# Patient Record
Sex: Female | Born: 1990 | Race: White | Hispanic: No | Smoking: Current every day smoker
Health system: Southern US, Community
[De-identification: ages and names within clinical notes are randomized; demographics above are authoritative.]

## PROBLEM LIST (undated history)

## (undated) DIAGNOSIS — F32A Depression, unspecified: Secondary | ICD-10-CM

## (undated) DIAGNOSIS — F419 Anxiety disorder, unspecified: Secondary | ICD-10-CM

## (undated) DIAGNOSIS — Q2545 Double aortic arch: Secondary | ICD-10-CM

## (undated) DIAGNOSIS — O009 Unspecified ectopic pregnancy without intrauterine pregnancy: Secondary | ICD-10-CM

## (undated) DIAGNOSIS — E039 Hypothyroidism, unspecified: Secondary | ICD-10-CM

## (undated) DIAGNOSIS — F319 Bipolar disorder, unspecified: Secondary | ICD-10-CM

## (undated) DIAGNOSIS — F329 Major depressive disorder, single episode, unspecified: Secondary | ICD-10-CM

## (undated) DIAGNOSIS — X789XXA Intentional self-harm by unspecified sharp object, initial encounter: Secondary | ICD-10-CM

## (undated) DIAGNOSIS — F17200 Nicotine dependence, unspecified, uncomplicated: Secondary | ICD-10-CM

## (undated) DIAGNOSIS — Z8619 Personal history of other infectious and parasitic diseases: Secondary | ICD-10-CM

## (undated) DIAGNOSIS — N83209 Unspecified ovarian cyst, unspecified side: Secondary | ICD-10-CM

## (undated) DIAGNOSIS — F410 Panic disorder [episodic paroxysmal anxiety] without agoraphobia: Secondary | ICD-10-CM

## (undated) HISTORY — DX: Double aortic arch: Q25.45

## (undated) HISTORY — DX: Unspecified ectopic pregnancy without intrauterine pregnancy: O00.90

## (undated) HISTORY — DX: Personal history of other infectious and parasitic diseases: Z86.19

## (undated) HISTORY — DX: Unspecified ovarian cyst, unspecified side: N83.209

---
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
## 2008-01-09 ENCOUNTER — Ambulatory Visit (HOSPITAL_COMMUNITY): Admission: RE | Admit: 2008-01-09 | Discharge: 2008-01-09 | Payer: Self-pay | Admitting: Obstetrics & Gynecology

## 2009-04-14 ENCOUNTER — Emergency Department (HOSPITAL_COMMUNITY): Admission: EM | Admit: 2009-04-14 | Discharge: 2009-04-14 | Payer: Self-pay | Admitting: Family Medicine

## 2010-02-02 ENCOUNTER — Emergency Department (HOSPITAL_COMMUNITY): Admission: EM | Admit: 2010-02-02 | Discharge: 2010-02-02 | Payer: Self-pay | Admitting: Family Medicine

## 2010-08-28 ENCOUNTER — Encounter: Payer: Self-pay | Admitting: Obstetrics & Gynecology

## 2010-08-28 ENCOUNTER — Encounter: Payer: Self-pay | Admitting: Family Medicine

## 2010-10-23 LAB — URINE CULTURE: Culture: NO GROWTH

## 2010-10-23 LAB — URINALYSIS, MICROSCOPIC ONLY
Bilirubin Urine: NEGATIVE
Glucose, UA: NEGATIVE mg/dL
Specific Gravity, Urine: 1.024 (ref 1.005–1.030)
Urobilinogen, UA: 1 mg/dL (ref 0.0–1.0)

## 2010-10-23 LAB — POCT URINALYSIS DIP (DEVICE)
Bilirubin Urine: NEGATIVE
Glucose, UA: NEGATIVE mg/dL
Nitrite: NEGATIVE
Urobilinogen, UA: 0.2 mg/dL (ref 0.0–1.0)

## 2010-10-23 LAB — WET PREP, GENITAL: Yeast Wet Prep HPF POC: NONE SEEN

## 2010-10-23 LAB — GC/CHLAMYDIA PROBE AMP, GENITAL
Chlamydia, DNA Probe: NEGATIVE
GC Probe Amp, Genital: NEGATIVE

## 2010-12-20 NOTE — H&P (Signed)
Alexa Guerra, Alexa Guerra NO.:  [id number]   MEDICAL RECORD NO.:  [id number]          PATIENT TYPE:  INP   LOCATION:  9306                          FACILITY:  [facility]   PHYSICIAN:  Roseanna Rainbow, M.D.DATE OF BIRTH:  May 30, 1991   DATE OF ADMISSION:  12/18/2007  DATE OF DISCHARGE:                              HISTORY & PHYSICAL   CHIEF COMPLAINT:  The patient is a 20 year old who presented to the  [facility] ED complaining of nausea, vomiting, and abdominal pain.   HISTORY OF PRESENT ILLNESS:  The patient reports the pain starting after  intercourse approximately 12 hours prior to presentation.  The pain  initially started in her abdomen, and she also subsequently developed  shoulder pain.  There was associated nausea and vomiting.   PAST MEDICAL HISTORY:  Possible depression.   SOCIAL HISTORY:  No tobacco, ethanol, or drug use.   FAMILY HISTORY:  Noncontributory.   PAST GYN HISTORY:  Last menstrual period November 25, 2007.   MEDICATIONS:  Lamictal and Wellbutrin.   ALLERGIES:  No known drug allergies.   PHYSICAL EXAMINATION:  VITAL SIGNS:  Blood pressure 113/71, temperature  97.2, pulse 85, and respirations 16.  GENERAL: In mild distress.  ABDOMEN: Soft.  There is mild diffuse lower quadrant and bilateral  tenderness.  No rebound.  No guarding.  PELVIC: Deferred.   LABORATORY DATA:  At 10:43, white blood cell count 11.6 and hemoglobin  13.4.  Complete metabolic profile, normal.  Urinalysis, specific gravity  1.026 and 15 ketones.  Wet Prep negative.  Hemoglobin at 18:00 11.6.  PT/PTT normal.  A CT scan of the abdomen and pelvis was remarkable for a  complex cystic area within the right ovary approximately 3 cm in  diameter consistent with likely collapsing hemorrhagic cyst, and there  was a large amount of free fluid in the pelvis.  Abdominal x-ray, no  acute abdominal findings.  Urine pregnancy test, negative.   ASSESSMENT:  Likely ruptured hemorrhagic  cyst with hemoperitoneum.  Hemodynamically stable at present.   PLAN:  1. Observation with serial labs and abdominal exams.  2. N.p.o. for now.  3. IV hydration.  4. Analgesics to include a morphine PCA.      Roseanna Rainbow, M.D.  Electronically Signed     LAJ/MEDQ  D:  12/18/2007  T:  12/19/2007  Job:  [id number]

## 2010-12-21 DIAGNOSIS — Z8659 Personal history of other mental and behavioral disorders: Secondary | ICD-10-CM

## 2010-12-21 HISTORY — DX: Personal history of other mental and behavioral disorders: Z86.59

## 2010-12-23 NOTE — Discharge Summary (Signed)
NAMEJINX, GILDEN NO.:  [id number]   MEDICAL RECORD NO.:  [id number]          PATIENT TYPE:  INP   LOCATION:  0105                          FACILITY:  [facility]   PHYSICIAN:  Carolanne Grumbling, M.D.    DATE OF BIRTH:  [birth year]   DATE OF ADMISSION:  04/29/2004  DATE OF DISCHARGE:  05/01/2004                                 DISCHARGE SUMMARY   INITIAL ASSESSMENT AND DIAGNOSIS:  Alexa Guerra was admitted to the hospital on  September 23.  I was on call September 24 and 25.  She said she had been  having fleeting suicidal ideation for no apparent reason.  She had no  intention of killing herself, no desire to kill herself.  She said she would  have mood swings and during the mood swings the thought of killing herself  would cross her mind.  She had no plans and no desire to hurt herself.  She  said this had been happening for about 2 months.  She did not know of any  reason why it was happening, that her life was okay, her family was good,  her school was good, and so forth.  She said she used to use drugs but she  stopped doing that about 2 months ago because they were doing her no good.   MENTAL STATUS AT THE TIME OF THE INITIAL EVALUATION:  Revealed an alert,  oriented girl who came to the interview willingly and was cooperative.  She  admitted to feeling depressed at times, with fleeting suicidal thoughts.  She said she would never kill herself.  She said she had mood swings for no  apparent reason.  There was no evidence of any thought disorder or other  psychosis.  Short and long-term memory were intact.  Judgment seemed  appropriate.  Insight was minimal.  Intellectual functioning seemed at least  average.  Concentration was adequate.   ADMITTING DIAGNOSIS:   AXIS I:  1.  Depressive disorder not otherwise specified.  2.  Polysubstance abuse.  3.  Rule out mood disorder not otherwise specified.   AXIS II:  Deferred.   AXIS III:  Healthy.   AXIS IV:  Mild.   AXIS V:  55/65.   FINDINGS:  All indicated laboratory examinations were within normal limits  or noncontributory, including a urine drug screen.   HOSPITAL COURSE:  While in the hospital, Alexa Guerra indicated she felt she did  not belong here.  She said she did have fleeting suicidal thoughts but she  was nowhere near as bad off as the peers in the group.  She said she had a  good family, she had a good life, she had a good boyfriend.  There really  were no great problems.  She did need some help she thought but she did not  need to be in the hospital to do it.  A session was held with her mother and  stepfather and father where they agreed she did not need to be in the  hospital and demanded that she be taken out.  They were told that with the  72 hour notice that could be done and they could talk to Dr. Marlyne Beards in the  morning when he came in and evaluated her.  Eventually, it became clear that  she was signed in by her stepfather who does not have the legal right to  sign her in and consequently mother insisted that she be released to her and  she would not sign the admission to the hospital and she was subsequently  released.  She was making no suicide threats at the time of discharge and  she denied any suicide threats while she was in the hospital.  The thoughts  she had were fleeting and never attached to any desire to hurt or kill  herself.   FINAL DIAGNOSES:   AXIS I:  1.  Depressive disorder not otherwise specified.  2.  Polysubstance abuse.  3.  Rule out mood disorder not otherwise specified.   AXIS II:  Deferred.   AXIS III:  Healthy.   AXIS IV:  Mild.   AXIS V:  55/65.   POST HOSPITAL CARE PLANS:  It was recommended that she follow up with  counseling after being discharged and the parents would be responsible for  setting that up.  There were no medicines used in the hospital, nor were any  prescribed at the time of discharge.   DIET AND ACTIVITY:  There were no  restrictions placed on her activity or her  diet.     [name]   GT/MEDQ  D:  05/09/2004  T:  05/09/2004  Job:  [id number]

## 2010-12-23 NOTE — H&P (Signed)
NAMETAYLI, BUCH NO.:  [id number]   MEDICAL RECORD NO.:  [id number]          PATIENT TYPE:  INP   LOCATION:  0105                          FACILITY[facility]  [facility]   PHYSICIAN:  Beverly Milch, MD     DATE OF BIRTH:  1991-06-17   DATE OF ADMISSION:  04/29/2004  DATE OF DISCHARGE:                         PSYCHIATRIC ADMISSION ASSESSMENT   INTRODUCTION:  The patient is a 20 year old female.   CHIEF COMPLAINT:  The patient was admitted to the hospital after expressing  suicidal thoughts and her general physician was concerned enough to have her  admitted to the hospital.   HISTORY OF PRESENT ILLNESS:  The patient says she has been having these  thoughts off and on for the last two months.  She said that nothing that she  knows of has been happening to make her depressed.  She says her moods have  been going up and down for no apparent reason.  She gets angry for no  reason.  She will be fine one minute, the next minute she will be all upset,  then she will fly off at somebody and then she feels guilty and bad about  being so rude and mean and silly.  Then she has suicidal thoughts, she says,  particularly when somebody says no to her or she cannot do something she  wants to do.  She has been overreacting.  She says she has not always been  this way.  She says she has been feeling down as well as having those  suicidal thoughts.  She says in reality she would never actually hurt  herself.   FAMILY/SCHOOL/SOCIAL ISSUES:  She says she lives with her mother and visits  her father regularly.  She is close to both her mother and her father.  They  are separated and divorced.  She gets along okay with both her stepfather  and her stepmother.  She has a 18-some-odd-year-old half-brother and they  have a very good relationship.  School, she says, goes well.  Her grades are  good.  She has friends.  She does not love school but she does not hate it  either.  She is in the 10th  grade.  She has a boyfriend that she has had for  the last five or six months and they have a very good relationship she says.  She denied any history of abuse, physically or sexually.  She enjoys hanging  out with her friends, riding four wheelers and generally feels good about  herself.  She says she has no real reason to be unhappy.  It seemed to her  that the moods just happen.  She has no desire to kill herself or hurt  herself and has nothing to be depressed about, she says.   PREVIOUS PSYCHIATRIC TREATMENT:  None.  She has tried Zoloft in the past  from her general practitioner but it did not do anything to help her.   DRUG/ALCOHOL/LEGAL ISSUES:  She says she has drunk alcohol, has used various  pills and also has smoked pot and was doing these fairly regularly until  about a month ago when she basically stopped because she said, even though  they temporarily helped the feelings that she has been having, they do not  really help in the long run and she was wasting her time.  She did smoke pot  two weeks ago but says she has no real interest at this point in using any  substances.   MEDICAL PROBLEMS/ALLERGIES/MEDICATIONS:  She reported no medical problems,  no known allergies and she is not taking any medications.   MENTAL STATUS EXAM:  At the time of the initial evaluation revealed an  alert, oriented teenager, who came to the interview willingly and was  cooperative.  She was appropriately dressed and groomed.  She said she has  been feeling depressed off and on.  She does not feel depressed all the  time.  She says that comes and goes and she has fleeting suicidal thoughts.  She says she has no desire to kill herself or interest in killing herself.  She says she used to be a cutter but she stopped that because that seemed  unnecessary.  There was no evidence of any thought disorder or other  psychosis.  Short and long-term memory were intact as measured by her  ability to  recall recent and remote events in her own life.  Her judgment  currently seemed adequate.  Insight was minimal.  Intellectual functioning  seemed at least average.  Concentration, she says, has been impaired  recently, in the last couple of months because of the mood swings.   ASSETS:  The patient is intelligent.  She is cooperative and says she wants  help.   ADMISSION DIAGNOSES:   AXIS I:  1.  Depressive disorder not otherwise specified.  2.  Polysubstance abuse.  3.  Rule out mood disorder not otherwise specified.   AXIS II:  Deferred.   AXIS III:  Healthy.   AXIS IV:  Mild.   AXIS V:  55/65.   ESTIMATED LENGTH OF STAY:  About five days.   PLAN:  To stabilize to the point of having a plan for dealing with her  depression and mood swings more effectively.  At this point, it is unclear  as to whether the mood disorder or the depressive disorder is more  prominent.  The plan is to observe her for a few days and then make a  decision as to which type of medicine, if any, to use.  She says she  preferred no medication.  She said she feels better already, just having  time to think about things and being in the hospital has thrown perspective  on things and she believes she can do better when she leaves.  Dr. Marlyne Beards  will be the attending.     Beverly Milch, MD  Electronically Signed  Tressie Ellis   GT/MEDQ  D:  04/30/2004  T:  04/30/2004  Job:  [id number]

## 2011-01-16 ENCOUNTER — Inpatient Hospital Stay (HOSPITAL_COMMUNITY): Payer: 59

## 2011-01-16 ENCOUNTER — Inpatient Hospital Stay (HOSPITAL_COMMUNITY)
Admission: AD | Admit: 2011-01-16 | Discharge: 2011-01-16 | Disposition: A | Discharge status: Home / Self Care | Payer: 59 | Source: Ambulatory Visit | Attending: Obstetrics and Gynecology | Admitting: Obstetrics and Gynecology

## 2011-01-16 DIAGNOSIS — A499 Bacterial infection, unspecified: Secondary | ICD-10-CM

## 2011-01-16 DIAGNOSIS — R109 Unspecified abdominal pain: Secondary | ICD-10-CM | POA: Insufficient documentation

## 2011-01-16 DIAGNOSIS — O209 Hemorrhage in early pregnancy, unspecified: Secondary | ICD-10-CM

## 2011-01-16 DIAGNOSIS — R52 Pain, unspecified: Secondary | ICD-10-CM

## 2011-01-16 DIAGNOSIS — O2390 Unspecified genitourinary tract infection in pregnancy, unspecified trimester: Secondary | ICD-10-CM | POA: Insufficient documentation

## 2011-01-16 DIAGNOSIS — B9689 Other specified bacterial agents as the cause of diseases classified elsewhere: Secondary | ICD-10-CM | POA: Insufficient documentation

## 2011-01-16 DIAGNOSIS — N760 Acute vaginitis: Secondary | ICD-10-CM | POA: Insufficient documentation

## 2011-01-16 LAB — URINALYSIS, ROUTINE W REFLEX MICROSCOPIC
Glucose, UA: NEGATIVE mg/dL
Hgb urine dipstick: NEGATIVE
Ketones, ur: NEGATIVE mg/dL
Leukocytes, UA: NEGATIVE
Protein, ur: NEGATIVE mg/dL
pH: 6 (ref 5.0–8.0)

## 2011-01-16 LAB — CBC
Hemoglobin: 12.7 g/dL (ref 12.0–15.0)
MCH: 29.7 pg (ref 26.0–34.0)
MCHC: 34 g/dL (ref 30.0–36.0)
Platelets: 198 10*3/uL (ref 150–400)
RDW: 11.8 % (ref 11.5–15.5)

## 2011-01-16 LAB — POCT PREGNANCY, URINE: Preg Test, Ur: POSITIVE

## 2011-01-16 LAB — WET PREP, GENITAL
Trich, Wet Prep: NONE SEEN
Yeast Wet Prep HPF POC: NONE SEEN

## 2011-01-17 ENCOUNTER — Inpatient Hospital Stay (HOSPITAL_COMMUNITY): Payer: 59

## 2011-01-17 ENCOUNTER — Inpatient Hospital Stay (HOSPITAL_COMMUNITY)
Admission: AD | Admit: 2011-01-17 | Discharge: 2011-01-17 | Disposition: A | Discharge status: Home / Self Care | Payer: 59 | Source: Ambulatory Visit | Attending: Obstetrics & Gynecology | Admitting: Obstetrics & Gynecology

## 2011-01-17 DIAGNOSIS — O039 Complete or unspecified spontaneous abortion without complication: Secondary | ICD-10-CM | POA: Insufficient documentation

## 2011-01-17 LAB — CBC
HCT: 37.8 % (ref 36.0–46.0)
Hemoglobin: 13.2 g/dL (ref 12.0–15.0)
MCHC: 34.9 g/dL (ref 30.0–36.0)
MCV: 87.9 fL (ref 78.0–100.0)
WBC: 7.5 10*3/uL (ref 4.0–10.5)

## 2011-01-17 LAB — RH IMMUNE GLOBULIN WORKUP (NOT WOMEN'S HOSP)
ABO/RH(D): O NEG
Antibody Screen: NEGATIVE
Unit division: 0

## 2011-01-17 LAB — GC/CHLAMYDIA PROBE AMP, GENITAL: Chlamydia, DNA Probe: NEGATIVE

## 2011-04-27 LAB — POCT PREGNANCY, URINE: Operator id: 257131

## 2012-04-25 LAB — OB RESULTS CONSOLE RPR: RPR: NONREACTIVE

## 2012-04-25 LAB — OB RESULTS CONSOLE GC/CHLAMYDIA: Chlamydia: NEGATIVE

## 2012-04-25 LAB — OB RESULTS CONSOLE HIV ANTIBODY (ROUTINE TESTING): HIV: NONREACTIVE

## 2012-08-07 NOTE — L&D Delivery Note (Signed)
Pt completed the first stage without complications. She had pit aug and an epidural. She had an uncomplicated second stage and delivered one live viable white female infant over a second degree midline tear. Placenta S/I. She had heavy bleeding after delivery of the placenta and was txd with Methergine. Her perineal tear was closed with 3-0 chromic. She also had a left labial tear closed with 3-0 vicryl. EBL-400cc. Baby to nbn.

## 2012-10-03 ENCOUNTER — Ambulatory Visit (INDEPENDENT_AMBULATORY_CARE_PROVIDER_SITE_OTHER): Payer: 59 | Admitting: Pediatrics

## 2012-10-03 DIAGNOSIS — Z7681 Expectant parent(s) prebirth pediatrician visit: Secondary | ICD-10-CM

## 2012-10-03 NOTE — Progress Notes (Signed)
Subjective:     Patient ID: Alexa Guerra, female   DOB: 1991-04-14, 22 y.o.   MRN: [id number] HPI Review of Systems Objective:   Physical Exam  Prenatal consult for expecting mother [redacted] weeks EGA, expecting a girl, first time mother Explained practice logistics, hours, open access scheduling, weekend clinic, after hours access Introduced conversation on vaccines, gave list of information resources for mother Allowed mother to ask any specific questions

## 2012-10-10 LAB — OB RESULTS CONSOLE GBS: GBS: NEGATIVE

## 2012-11-11 ENCOUNTER — Telehealth (HOSPITAL_COMMUNITY): Payer: Self-pay | Admitting: *Deleted

## 2012-11-11 ENCOUNTER — Encounter (HOSPITAL_COMMUNITY): Payer: Self-pay | Admitting: *Deleted

## 2012-11-11 NOTE — Telephone Encounter (Signed)
Preadmission screen  

## 2012-11-13 ENCOUNTER — Encounter (HOSPITAL_COMMUNITY): Payer: Self-pay | Admitting: Anesthesiology

## 2012-11-13 ENCOUNTER — Inpatient Hospital Stay (HOSPITAL_COMMUNITY)
Admission: AD | Admit: 2012-11-13 | Discharge: 2012-11-15 | DRG: 775 | Disposition: A | Discharge status: Home / Self Care | Payer: Medicaid Other | Source: Ambulatory Visit | Attending: Obstetrics and Gynecology | Admitting: Obstetrics and Gynecology

## 2012-11-13 ENCOUNTER — Encounter (HOSPITAL_COMMUNITY): Payer: Self-pay | Admitting: *Deleted

## 2012-11-13 ENCOUNTER — Inpatient Hospital Stay (HOSPITAL_COMMUNITY): Payer: Medicaid Other | Admitting: Anesthesiology

## 2012-11-13 DIAGNOSIS — O99892 Other specified diseases and conditions complicating childbirth: Secondary | ICD-10-CM | POA: Diagnosis present

## 2012-11-13 DIAGNOSIS — Q249 Congenital malformation of heart, unspecified: Secondary | ICD-10-CM

## 2012-11-13 LAB — CBC
Hemoglobin: 11.3 g/dL — ABNORMAL LOW (ref 12.0–15.0)
MCH: 29.7 pg (ref 26.0–34.0)
MCV: 88.2 fL (ref 78.0–100.0)
Platelets: 214 10*3/uL (ref 150–400)
RBC: 3.81 MIL/uL — ABNORMAL LOW (ref 3.87–5.11)
WBC: 12.3 10*3/uL — ABNORMAL HIGH (ref 4.0–10.5)

## 2012-11-13 MED ORDER — PHENYLEPHRINE 40 MCG/ML (10ML) SYRINGE FOR IV PUSH (FOR BLOOD PRESSURE SUPPORT)
80.0000 ug | PREFILLED_SYRINGE | INTRAVENOUS | Status: DC | PRN
  Filled 2012-11-13: qty 5
  Filled 2012-11-13: qty 2

## 2012-11-13 MED ORDER — ACETAMINOPHEN 325 MG PO TABS: 650.0000 mg | ORAL_TABLET | ORAL | Status: DC | PRN

## 2012-11-13 MED ORDER — IBUPROFEN 600 MG PO TABS
600.0000 mg | ORAL_TABLET | Freq: Four times a day (QID) | ORAL | Status: DC
  Administered 2012-11-13 – 2012-11-15 (×8): 600 mg via ORAL
  Filled 2012-11-13 (×8): qty 1

## 2012-11-13 MED ORDER — TETANUS-DIPHTH-ACELL PERTUSSIS 5-2.5-18.5 LF-MCG/0.5 IM SUSP
0.5000 mL | Freq: Once | INTRAMUSCULAR | Status: AC
  Administered 2012-11-14: 0.5 mL via INTRAMUSCULAR

## 2012-11-13 MED ORDER — EPHEDRINE 5 MG/ML INJ
10.0000 mg | INTRAVENOUS | Status: DC | PRN
  Filled 2012-11-13: qty 2

## 2012-11-13 MED ORDER — OXYCODONE-ACETAMINOPHEN 5-325 MG PO TABS: 1.0000 | ORAL_TABLET | ORAL | Status: DC | PRN

## 2012-11-13 MED ORDER — ZOLPIDEM TARTRATE 5 MG PO TABS: 5.0000 mg | ORAL_TABLET | Freq: Every evening | ORAL | Status: DC | PRN

## 2012-11-13 MED ORDER — OXYTOCIN 40 UNITS IN LACTATED RINGERS INFUSION - SIMPLE MED: 62.5000 mL/h | INTRAVENOUS | Status: DC

## 2012-11-13 MED ORDER — DIPHENHYDRAMINE HCL 50 MG/ML IJ SOLN: 12.5000 mg | INTRAMUSCULAR | Status: DC | PRN

## 2012-11-13 MED ORDER — ONDANSETRON HCL 4 MG/2ML IJ SOLN: 4.0000 mg | INTRAMUSCULAR | Status: DC | PRN

## 2012-11-13 MED ORDER — EPHEDRINE 5 MG/ML INJ
10.0000 mg | INTRAVENOUS | Status: DC | PRN
  Filled 2012-11-13: qty 2
  Filled 2012-11-13: qty 4

## 2012-11-13 MED ORDER — OXYCODONE-ACETAMINOPHEN 5-325 MG PO TABS
1.0000 | ORAL_TABLET | ORAL | Status: DC | PRN
  Administered 2012-11-13 – 2012-11-14 (×2): 1 via ORAL
  Administered 2012-11-14: 2 via ORAL
  Administered 2012-11-15: 1 via ORAL
  Filled 2012-11-13: qty 2
  Filled 2012-11-13 (×3): qty 1

## 2012-11-13 MED ORDER — MEASLES, MUMPS & RUBELLA VAC ~~LOC~~ INJ
0.5000 mL | INJECTION | Freq: Once | SUBCUTANEOUS | Status: DC
  Filled 2012-11-13: qty 0.5

## 2012-11-13 MED ORDER — CITRIC ACID-SODIUM CITRATE 334-500 MG/5ML PO SOLN
30.0000 mL | ORAL | Status: DC | PRN
  Administered 2012-11-13: 30 mL via ORAL
  Filled 2012-11-13: qty 15

## 2012-11-13 MED ORDER — ONDANSETRON HCL 4 MG/2ML IJ SOLN
4.0000 mg | Freq: Four times a day (QID) | INTRAMUSCULAR | Status: DC | PRN
  Administered 2012-11-13: 4 mg via INTRAVENOUS
  Filled 2012-11-13: qty 2

## 2012-11-13 MED ORDER — LIDOCAINE HCL (PF) 1 % IJ SOLN
INTRAMUSCULAR | Status: DC | PRN
  Administered 2012-11-13 (×4): 4 mL

## 2012-11-13 MED ORDER — METHYLERGONOVINE MALEATE 0.2 MG/ML IJ SOLN
INTRAMUSCULAR | Status: AC
  Administered 2012-11-13: 0.2 mg
  Filled 2012-11-13: qty 1

## 2012-11-13 MED ORDER — LACTATED RINGERS IV SOLN: 500.0000 mL | INTRAVENOUS | Status: DC | PRN

## 2012-11-13 MED ORDER — FLEET ENEMA 7-19 GM/118ML RE ENEM: 1.0000 | ENEMA | RECTAL | Status: DC | PRN

## 2012-11-13 MED ORDER — FENTANYL 2.5 MCG/ML BUPIVACAINE 1/10 % EPIDURAL INFUSION (WH - ANES)
14.0000 mL/h | INTRAMUSCULAR | Status: DC | PRN
  Administered 2012-11-13 (×2): 14 mL/h via EPIDURAL
  Filled 2012-11-13 (×2): qty 125

## 2012-11-13 MED ORDER — LIDOCAINE HCL (PF) 1 % IJ SOLN
30.0000 mL | INTRAMUSCULAR | Status: DC | PRN
  Administered 2012-11-13: 30 mL via SUBCUTANEOUS
  Filled 2012-11-13 (×2): qty 30

## 2012-11-13 MED ORDER — IBUPROFEN 600 MG PO TABS: 600.0000 mg | ORAL_TABLET | Freq: Four times a day (QID) | ORAL | Status: DC | PRN

## 2012-11-13 MED ORDER — PHENYLEPHRINE 40 MCG/ML (10ML) SYRINGE FOR IV PUSH (FOR BLOOD PRESSURE SUPPORT)
80.0000 ug | PREFILLED_SYRINGE | INTRAVENOUS | Status: DC | PRN
  Filled 2012-11-13: qty 2

## 2012-11-13 MED ORDER — LACTATED RINGERS IV SOLN
500.0000 mL | Freq: Once | INTRAVENOUS | Status: AC
  Administered 2012-11-13: 500 mL via INTRAVENOUS

## 2012-11-13 MED ORDER — ONDANSETRON HCL 4 MG PO TABS: 4.0000 mg | ORAL_TABLET | ORAL | Status: DC | PRN

## 2012-11-13 MED ORDER — BENZOCAINE-MENTHOL 20-0.5 % EX AERO
1.0000 "application " | INHALATION_SPRAY | CUTANEOUS | Status: DC | PRN
  Administered 2012-11-13: 1 via TOPICAL
  Filled 2012-11-13: qty 56

## 2012-11-13 MED ORDER — SIMETHICONE 80 MG PO CHEW
80.0000 mg | CHEWABLE_TABLET | ORAL | Status: DC | PRN
  Administered 2012-11-14: 80 mg via ORAL

## 2012-11-13 MED ORDER — OXYTOCIN BOLUS FROM INFUSION
500.0000 mL | INTRAVENOUS | Status: DC
  Administered 2012-11-13: 500 mL via INTRAVENOUS

## 2012-11-13 MED ORDER — LACTATED RINGERS IV SOLN
INTRAVENOUS | Status: DC
  Administered 2012-11-13: 125 mL via INTRAVENOUS
  Administered 2012-11-13: 04:00:00 via INTRAVENOUS

## 2012-11-13 MED ORDER — BUTORPHANOL TARTRATE 1 MG/ML IJ SOLN
1.0000 mg | INTRAMUSCULAR | Status: DC | PRN
  Administered 2012-11-13: 1 mg via INTRAVENOUS
  Filled 2012-11-13: qty 1

## 2012-11-13 MED ORDER — OXYTOCIN 40 UNITS IN LACTATED RINGERS INFUSION - SIMPLE MED
1.0000 m[IU]/min | INTRAVENOUS | Status: DC
  Administered 2012-11-13: 2 m[IU]/min via INTRAVENOUS
  Administered 2012-11-13: 6 m[IU]/min via INTRAVENOUS
  Filled 2012-11-13: qty 1000

## 2012-11-13 MED ORDER — DIBUCAINE 1 % RE OINT: 1.0000 "application " | TOPICAL_OINTMENT | RECTAL | Status: DC | PRN

## 2012-11-13 MED ORDER — TERBUTALINE SULFATE 1 MG/ML IJ SOLN: 0.2500 mg | Freq: Once | INTRAMUSCULAR | Status: DC | PRN

## 2012-11-13 MED ORDER — WITCH HAZEL-GLYCERIN EX PADS: 1.0000 "application " | MEDICATED_PAD | CUTANEOUS | Status: DC | PRN

## 2012-11-13 NOTE — MAU Note (Signed)
PT SAYS SHE SROM AT 0135- CLEAR.    VE ON Monday IN OFFICE - 1-2 CM.   DENIES HSV AND MRSA.   HAVING SOME UC'S.

## 2012-11-13 NOTE — H&P (Signed)
Pt is a 22 year old white female, G2P0 at term who is admitted secondary to reported SROM. PNC was uncomplicated. Pt has a congenital heart defect asymptomatic. Fetus has a normal echo.  PMHx: see hollister. PE: VSSAF        ABD- gravid, palp contractions, on Pit Aug        FHTs- reactive IMP/ IUP with SROM in labor. PLAN/ admit

## 2012-11-13 NOTE — Anesthesia Preprocedure Evaluation (Signed)
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   Neuro/Psych negative neurological ROS  negative psych ROS   GI/Hepatic Neg liver ROS, GERD-  Medicated,  Endo/Other  negative endocrine ROS  Renal/GU negative Renal ROS  negative genitourinary   Musculoskeletal   Abdominal   Peds  Hematology negative hematology ROS (+)   Anesthesia Other Findings   Reproductive/Obstetrics (+) Pregnancy                           Anesthesia Physical Anesthesia Plan  ASA: II  Anesthesia Plan: Epidural   Post-op Pain Management:    Induction:   Airway Management Planned:   Additional Equipment:   Intra-op Plan:   Post-operative Plan:   Informed Consent: I have reviewed the patients History and Physical, chart, labs and discussed the procedure including the risks, benefits and alternatives for the proposed anesthesia with the patient or authorized representative who has indicated his/her understanding and acceptance.     Plan Discussed with:   Anesthesia Plan Comments:         Anesthesia Quick Evaluation

## 2012-11-13 NOTE — Anesthesia Procedure Notes (Signed)
Epidural Patient location during procedure: OB Start time: 11/13/2012 8:22 AM  Staffing Performed by: anesthesiologist   Preanesthetic Checklist Completed: patient identified, site marked, surgical consent, pre-op evaluation, timeout performed, IV checked, risks and benefits discussed and monitors and equipment checked  Epidural Patient position: sitting Prep: site prepped and draped and DuraPrep Patient monitoring: continuous pulse ox and blood pressure Approach: midline Injection technique: LOR air  Needle:  Needle type: Tuohy  Needle gauge: 17 G Needle length: 9 cm and 9 Needle insertion depth: 6 cm Catheter type: closed end flexible Catheter size: 19 Gauge Catheter at skin depth: 11 cm Test dose: negative  Assessment Events: blood not aspirated, injection not painful, no injection resistance, negative IV test and no paresthesia  Additional Notes Discussed risk of headache, infection, bleeding, nerve injury and failed or incomplete block.  Patient voices understanding and wishes to proceed.  Epidural placed easily on first attempt.  No paresthesia.  Patient tolerated procedure well with no apparent complications.  Jasmine December, MD Reason for block:procedure for pain

## 2012-11-13 NOTE — Progress Notes (Signed)
Pt requested pain medication, then refused to take any when RN offered it. Pt is trying to make it through as long as possible without IV pain medication.

## 2012-11-14 ENCOUNTER — Inpatient Hospital Stay (HOSPITAL_COMMUNITY): Admission: RE | Admit: 2012-11-14 | Payer: 59 | Source: Ambulatory Visit

## 2012-11-14 LAB — CBC
Hemoglobin: 8.7 g/dL — ABNORMAL LOW (ref 12.0–15.0)
Platelets: 181 10*3/uL (ref 150–400)
RBC: 2.91 MIL/uL — ABNORMAL LOW (ref 3.87–5.11)
WBC: 15.7 10*3/uL — ABNORMAL HIGH (ref 4.0–10.5)

## 2012-11-14 NOTE — Progress Notes (Signed)
UR chart review completed.  

## 2012-11-14 NOTE — Anesthesia Postprocedure Evaluation (Signed)
  Anesthesia Post-op Note  Patient: Alexa Guerra  Procedure(s) Performed: * No procedures listed *  Patient Location: PACU and Mother/Baby  Anesthesia Type:Epidural  Level of Consciousness: awake, alert , oriented and patient cooperative  Airway and Oxygen Therapy: Patient Spontanous Breathing  Post-op Pain: none  Post-op Assessment: Post-op Vital signs reviewed, Patient's Cardiovascular Status Stable and Respiratory Function Stable  Post-op Vital Signs: Reviewed and stable  Complications: No apparent anesthesia complications

## 2012-11-14 NOTE — Progress Notes (Signed)
Patient is eating, ambulating, voiding.  Pain control is good.  Filed Vitals:   11/13/12 2002 11/13/12 2113 11/14/12 0204 11/14/12 0650  BP: 123/74 118/73 114/70 123/69  Pulse: 123 118 96 73  Temp: 98.9 F (37.2 C) 98.5 F (36.9 C) 98.9 F (37.2 C) 97.8 F (36.6 C)  TempSrc: Oral Oral Oral Oral  Resp: 20 20 20 20   Height:      Weight:      SpO2: 97% 97% 99% 99%    Fundus firm Perineum without swelling.  Lab Results  Component Value Date   WBC 12.3* 11/13/2012   HGB 11.3* 11/13/2012   HCT 33.6* 11/13/2012   MCV 88.2 11/13/2012   PLT 214 11/13/2012    O/Negative/-- (09/19 0000)/RI  A/P Post partum day 1.  Routine care.  Expect d/c tomorrow.   Baby is Rh Neg- no rhogam needed.  Doloras Tellado A

## 2012-11-15 NOTE — Discharge Summary (Signed)
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  Discharge Diagnoses: Term Pregnancy-delivered  Discharge Information: Date: 11/15/2012 Activity: pelvic rest Diet: routine Medications: PNV, Ibuprofen, Colace and Iron Condition: stable Instructions: refer to practice specific booklet Discharge to: home Follow-up Information   Follow up with Levi Aland, MD In 4 weeks.   Contact information:   [address] [place] [address] [phone]       Newborn Data: Live born female  Birth Weight: 8 lb (3630 g) APGAR: 8, 8  Home with mother.  Philip Aspen 11/15/2012, 9:21 AM

## 2014-06-08 ENCOUNTER — Encounter (HOSPITAL_COMMUNITY): Payer: Self-pay | Admitting: *Deleted

## 2015-01-14 ENCOUNTER — Emergency Department (HOSPITAL_COMMUNITY)
Admission: EM | Admit: 2015-01-14 | Discharge: 2015-01-15 | Disposition: A | Discharge status: Home / Self Care | Payer: Medicaid Other | Attending: Emergency Medicine | Admitting: Emergency Medicine

## 2015-01-14 ENCOUNTER — Emergency Department (HOSPITAL_COMMUNITY): Payer: Medicaid Other

## 2015-01-14 ENCOUNTER — Encounter (HOSPITAL_COMMUNITY): Payer: Self-pay | Admitting: *Deleted

## 2015-01-14 DIAGNOSIS — N938 Other specified abnormal uterine and vaginal bleeding: Secondary | ICD-10-CM | POA: Insufficient documentation

## 2015-01-14 DIAGNOSIS — Q254 Other congenital malformations of aorta: Secondary | ICD-10-CM | POA: Insufficient documentation

## 2015-01-14 DIAGNOSIS — N8320 Unspecified ovarian cysts: Secondary | ICD-10-CM | POA: Insufficient documentation

## 2015-01-14 DIAGNOSIS — N83202 Unspecified ovarian cyst, left side: Secondary | ICD-10-CM

## 2015-01-14 DIAGNOSIS — Z8619 Personal history of other infectious and parasitic diseases: Secondary | ICD-10-CM | POA: Insufficient documentation

## 2015-01-14 DIAGNOSIS — N390 Urinary tract infection, site not specified: Secondary | ICD-10-CM | POA: Insufficient documentation

## 2015-01-14 DIAGNOSIS — Z3202 Encounter for pregnancy test, result negative: Secondary | ICD-10-CM | POA: Insufficient documentation

## 2015-01-14 DIAGNOSIS — R102 Pelvic and perineal pain: Secondary | ICD-10-CM

## 2015-01-14 LAB — CBC WITH DIFFERENTIAL/PLATELET
Basophils Absolute: 0 10*3/uL (ref 0.0–0.1)
Basophils Relative: 0 % (ref 0–1)
Eosinophils Absolute: 0.1 10*3/uL (ref 0.0–0.7)
Eosinophils Relative: 1 % (ref 0–5)
HCT: 39.3 % (ref 36.0–46.0)
Hemoglobin: 13.6 g/dL (ref 12.0–15.0)
Lymphocytes Relative: 17 % (ref 12–46)
Lymphs Abs: 1.9 10*3/uL (ref 0.7–4.0)
MCH: 30.3 pg (ref 26.0–34.0)
MCHC: 34.6 g/dL (ref 30.0–36.0)
MCV: 87.5 fL (ref 78.0–100.0)
Monocytes Absolute: 0.6 10*3/uL (ref 0.1–1.0)
Monocytes Relative: 5 % (ref 3–12)
Neutro Abs: 8.3 10*3/uL — ABNORMAL HIGH (ref 1.7–7.7)
Neutrophils Relative %: 77 % (ref 43–77)
Platelets: 234 10*3/uL (ref 150–400)
RBC: 4.49 MIL/uL (ref 3.87–5.11)
RDW: 12.3 % (ref 11.5–15.5)
WBC: 10.9 10*3/uL — ABNORMAL HIGH (ref 4.0–10.5)

## 2015-01-14 LAB — COMPREHENSIVE METABOLIC PANEL
ALT: 21 U/L (ref 14–54)
AST: 21 U/L (ref 15–41)
Albumin: 4 g/dL (ref 3.5–5.0)
Alkaline Phosphatase: 54 U/L (ref 38–126)
Anion gap: 9 (ref 5–15)
BUN: 11 mg/dL (ref 6–20)
CO2: 25 mmol/L (ref 22–32)
Calcium: 9.1 mg/dL (ref 8.9–10.3)
Chloride: 102 mmol/L (ref 101–111)
Creatinine, Ser: 0.97 mg/dL (ref 0.44–1.00)
GFR calc Af Amer: >60 mL/min (ref >60)
GFR calc non Af Amer: >60 mL/min (ref >60)
Glucose, Bld: 102 mg/dL — ABNORMAL HIGH (ref 65–99)
Potassium: 4.2 mmol/L (ref 3.5–5.1)
Sodium: 136 mmol/L (ref 135–145)
Total Bilirubin: 0.4 mg/dL (ref 0.3–1.2)
Total Protein: 6.7 g/dL (ref 6.5–8.1)

## 2015-01-14 LAB — URINALYSIS, ROUTINE W REFLEX MICROSCOPIC
Bilirubin Urine: NEGATIVE
Glucose, UA: NEGATIVE mg/dL
Ketones, ur: NEGATIVE mg/dL
Nitrite: NEGATIVE
Protein, ur: NEGATIVE mg/dL
Specific Gravity, Urine: 1.021 (ref 1.005–1.030)
Urobilinogen, UA: 0.2 mg/dL (ref 0.0–1.0)
pH: 6 (ref 5.0–8.0)

## 2015-01-14 LAB — WET PREP, GENITAL
Trich, Wet Prep: NONE SEEN
Yeast Wet Prep HPF POC: NONE SEEN

## 2015-01-14 LAB — URINE MICROSCOPIC-ADD ON

## 2015-01-14 LAB — POC URINE PREG, ED: Preg Test, Ur: NEGATIVE

## 2015-01-14 MED ORDER — KETOROLAC TROMETHAMINE 60 MG/2ML IM SOLN
60.0000 mg | Freq: Once | INTRAMUSCULAR | Status: AC
  Administered 2015-01-14: 60 mg via INTRAMUSCULAR
  Filled 2015-01-14: qty 2

## 2015-01-14 NOTE — ED Provider Notes (Signed)
CSN: [id number]     Arrival date & time 01/14/15  1833 History   This chart was scribed for non-physician practitioner Kerrie Buffalo, NP working with Alexa Razor, MD by Lyndel Safe, ED Scribe. This patient was seen in room TR05C/TR05C and the patient's care was started at 8:32 PM.   Chief Complaint  Patient presents with  . Abdominal Pain  . Vaginal Bleeding   Patient is a 24 y.o. female presenting with abdominal pain and vaginal bleeding. The history is provided by the patient. No language interpreter was used.  Abdominal Pain Pain location:  Suprapubic Pain radiates to:  Does not radiate Pain severity:  Moderate Onset quality:  Sudden Duration:  2 weeks Timing:  Constant Progression:  Worsening Chronicity:  New Relieved by:  Nothing Associated symptoms: vaginal bleeding   Associated symptoms: no fever   Vaginal Bleeding Associated symptoms: abdominal pain   Associated symptoms: no fever    HPI Comments: Alexa Guerra is a 24 y.o. female who presents to the Emergency Department complaining of gradually worsening, constant, moderate suprapubic abdominal pain and vaginal bleeding onset 2 weeks ago. She states the pain was brought on by intercourse with moderate bleeding, but that the bleeding has since subsided to mild bleeding. She notes her last [place] was in 2014. She has a PMhx of ovarian cyst and reports the symptoms feel similar to her past symptoms of a ruptured cyst. She denies fevers.    Past Medical History  Diagnosis Date  . Hx of varicella   . Ovarian cyst   . Double aortic arch   . Double aortic arch    Past Surgical History  Procedure Laterality Date  . No past surgeries     Family History  Problem Relation Age of Onset  . Drug abuse Mother   . Cancer Mother     cervix  . Alcohol abuse Father   . Hypertension Maternal Grandmother   . Diabetes Maternal Grandmother   . Hypertension Maternal Grandfather   . Diabetes Maternal Grandfather   . Hypertension  Paternal Grandmother   . Hypertension Paternal Grandfather    History  Substance Use Topics  . Smoking status: Never Smoker   . Smokeless tobacco: Never Used  . Alcohol Use: No   OB History    Gravida Para Term Preterm AB TAB SAB Ectopic Multiple Living   Review of Systems  Constitutional: Negative for fever.  Gastrointestinal: Positive for abdominal pain.  Genitourinary: Positive for vaginal bleeding.  all other systems negative Allergies  Review of patient's allergies indicates no known allergies.  Home Medications   Prior to Admission medications   Medication Sig Start Date End Date Taking? Authorizing Provider  cephALEXin (KEFLEX) 500 MG capsule Take 1 capsule (500 mg total) by mouth 3 (three) times daily. 01/15/15   Kashae Carstens Orlene Och, NP  HYDROcodone-acetaminophen (NORCO) 5-325 MG per tablet Take 1 tablet by mouth every 6 (six) hours as needed for moderate pain. 01/15/15   Yocelyn Brocious Orlene Och, NP  naproxen (NAPROSYN) 500 MG tablet Take 1 tablet (500 mg total) by mouth 2 (two) times daily. 01/15/15   Kazimierz Springborn Orlene Och, NP   BP 131/64 mmHg  Pulse 75  Temp(Src) 98.4 F (36.9 C) (Oral)  Resp 20  Ht  (1.626 m)  Wt 135 lb 2 oz (61.292 kg)  BMI 23.18 kg/m2  SpO2 99%  LMP 12/23/2014  Physical Exam  Constitutional: She is oriented to person, place, and time. She appears well-developed and well-nourished. No distress.  HENT:  Head: Normocephalic.  Right Ear: External ear normal.  Left Ear: External ear normal.  Mouth/Throat: No oropharyngeal exudate.  Eyes: EOM are normal.  Neck: Normal range of motion. Neck supple.  Cardiovascular: Normal rate and regular rhythm.   Pulmonary/Chest: Effort normal and breath sounds normal. No respiratory distress.  Abdominal: Soft. Bowel sounds are normal. There is tenderness. There is no rebound, no guarding and no CVA tenderness.  Tenderness in lower abdominal region.   Genitourinary:  External genitalia without lesions. Blood  tinged discharge.  positive CMT, bilateral adnexal tenderness, uterus without palpable enlargement.   Musculoskeletal: Normal range of motion. She exhibits no edema.  Neurological: She is alert and oriented to person, place, and time.  Skin: Skin is warm and dry.  Psychiatric: She has a normal mood and affect. Her behavior is normal.  Nursing note and vitals reviewed.  ED Course  Procedures  DIAGNOSTIC STUDIES: Oxygen Saturation is 99% on RA, normal by my interpretation.    COORDINATION OF CARE: 8:35 PM Discussed treatment plan which includes to get an [place] and do a pelvic exam. Pt acknowledges and agrees to plan.  10:26 PM Pelvic exam performed with chaperone present.   Labs Review Results for orders placed or performed during the hospital encounter of 01/14/15 (from the past 24 hour(s))  CBC with Differential     Status: Abnormal   Collection Time: 01/14/15  7:07 PM  Result Value Ref Range   WBC 10.9 (H) 4.0 - 10.5 K/uL   RBC 4.49 3.87 - 5.11 MIL/uL   Hemoglobin 13.6 12.0 - 15.0 g/dL   HCT 16.1 09.6 - 04.5 %   MCV 87.5 78.0 - 100.0 fL   MCH 30.3 26.0 - 34.0 pg   MCHC 34.6 30.0 - 36.0 g/dL   RDW 40.9 81.1 - 91.4 %   Platelets 234 150 - 400 K/uL   Neutrophils Relative % 77 43 - 77 %   Neutro Abs 8.3 (H) 1.7 - 7.7 K/uL   Lymphocytes Relative 17 12 - 46 %   Lymphs Abs 1.9 0.7 - 4.0 K/uL   Monocytes Relative 5 3 - 12 %   Monocytes Absolute 0.6 0.1 - 1.0 K/uL   Eosinophils Relative 1 0 - 5 %   Eosinophils Absolute 0.1 0.0 - 0.7 K/uL   Basophils Relative 0 0 - 1 %   Basophils Absolute 0.0 0.0 - 0.1 K/uL  Comprehensive metabolic panel     Status: Abnormal   Collection Time: 01/14/15  7:07 PM  Result Value Ref Range   Sodium 136 135 - 145 mmol/L   Potassium 4.2 3.5 - 5.1 mmol/L   Chloride 102 101 - 111 mmol/L   CO2 25 22 - 32 mmol/L   Glucose, Bld 102 (H) 65 - 99 mg/dL   BUN 11 6 - 20 mg/dL   Creatinine, Ser 7.82 0.44 - 1.00 mg/dL   Calcium 9.1 8.9 - 95.6 mg/dL   Total  Protein 6.7 6.5 - 8.1 g/dL   Albumin 4.0 3.5 - 5.0 g/dL   AST 21 15 - 41 U/L   ALT 21 14 - 54 U/L   Alkaline Phosphatase 54 38 - 126 U/L   Total Bilirubin 0.4 0.3 - 1.2 mg/dL   GFR calc non Af Amer >60 >60 mL/min   GFR calc Af Amer >60 >60 mL/min   Anion gap 9 5 - 15  Urinalysis, Routine w reflex microscopic (not at [facility])     Status: Abnormal   Collection Time: 01/14/15  7:07 PM  Result Value Ref Range   Color, Urine YELLOW YELLOW   APPearance CLOUDY (A) CLEAR   Specific Gravity, Urine 1.021 1.005 - 1.030   pH 6.0 5.0 - 8.0   Glucose, UA NEGATIVE NEGATIVE mg/dL   Hgb urine dipstick MODERATE (A) NEGATIVE   Bilirubin Urine NEGATIVE NEGATIVE   Ketones, ur NEGATIVE NEGATIVE mg/dL   Protein, ur NEGATIVE NEGATIVE mg/dL   Urobilinogen, UA 0.2 0.0 - 1.0 mg/dL   Nitrite NEGATIVE NEGATIVE   Leukocytes, UA MODERATE (A) NEGATIVE  Urine microscopic-add on     Status: Abnormal   Collection Time: 01/14/15  7:07 PM  Result Value Ref Range   Squamous Epithelial / LPF FEW (A) RARE   WBC, UA 7-10 <3 WBC/hpf   RBC / HPF 7-10 <3 RBC/hpf   Bacteria, UA FEW (A) RARE   Urine-Other MUCOUS PRESENT   POC Urine Pregnancy, ED  (If Pre-menopausal female)  not at [facility]     Status: None   Collection Time: 01/14/15  7:23 PM  Result Value Ref Range   Preg Test, Ur NEGATIVE NEGATIVE  Wet prep, genital     Status: Abnormal   Collection Time: 01/14/15 10:29 PM  Result Value Ref Range   Yeast Wet Prep HPF POC NONE SEEN NONE SEEN   Trich, Wet Prep NONE SEEN NONE SEEN   Clue Cells Wet Prep HPF POC FEW (A) NONE SEEN   WBC, Wet Prep HPF POC RARE (A) NONE SEEN    US Transvaginal Non-ob  01/14/2015   CLINICAL DATA:  Postcoital pelvic pain for 3 days, history of ovarian cyst.  EXAM: TRANSABDOMINAL AND TRANSVAGINAL ULTRASOUND OF PELVIS  TECHNIQUE: Both transabdominal and transvaginal ultrasound examinations of the pelvis were performed. Transabdominal technique was performed for global imaging of the pelvis including  uterus, ovaries, adnexal regions, and pelvic cul-de-sac. It was necessary to proceed with endovaginal exam following the transabdominal exam to visualize the endometrium and ovaries. Color and duplex Doppler ultrasound was utilized to evaluate blood flow to the ovaries.  COMPARISON:  None  FINDINGS: Uterus  Measurements: 7.9 x 3.7 x 5.3 cm. No fibroids or other mass visualized.  Endometrium  Thickness: 5 mm.  No focal abnormality visualized.  Right ovary  Measurements: 4.5 x 2.3 x 2.5 cm. Normal appearance/no adnexal mass.  Left ovary  Measurements: 5.4 x 4.1 x 3.7 cm. 2 x 2.2 x 2.1 cm echogenic avascular likely hemorrhagic cyst with increased through transmission.  Other findings  Small amount of free fluid in the pelvis could be physiologic.  IMPRESSION: 2.2 cm likely hemorrhagic cyst LEFT ovary, with otherwise unremarkable pelvic ultrasound.   Electronically Signed   By: Awilda Metro M.D.   On: 01/14/2015 23:32     MDM  24 y.o. female with pelvic pain and vaginal bleeding after intercourse. Stable for d/c without heavy bleeding. She will follow up with her GYN. Discussed with the patient and all questioned fully answered. She will return here if any problems arise.  Final diagnoses:  Pelvic pain in female  Left ovarian cyst  UTI (lower urinary tract infection)   I personally performed the services described in this documentation, which was scribed in my presence. The recorded information has been reviewed and is accurate.    [address] [place], [place] 01/15/15 0240  Alexa Razor, MD 01/16/15 2250

## 2015-01-14 NOTE — ED Notes (Signed)
Pt c/o suprapubic cramping x 2 weeks.  States pain and bleeding began when she was having intercourse and has continued.  Pt has hx of ruptured ovarian cyst and states this pain feels the same.

## 2015-01-14 NOTE — ED Notes (Signed)
Pelvic cart at bedside. 

## 2015-01-14 NOTE — ED Notes (Signed)
NP at bedside for pelvic

## 2015-01-15 LAB — GC/CHLAMYDIA PROBE AMP (~~LOC~~) NOT AT ARMC
Chlamydia: POSITIVE — AB
Neisseria Gonorrhea: POSITIVE — AB

## 2015-01-15 MED ORDER — HYDROCODONE-ACETAMINOPHEN 5-325 MG PO TABS: 1.0000 | ORAL_TABLET | Freq: Four times a day (QID) | ORAL | Status: DC | PRN

## 2015-01-15 MED ORDER — NAPROXEN 500 MG PO TABS: 500.0000 mg | ORAL_TABLET | Freq: Two times a day (BID) | ORAL | Status: DC

## 2015-01-15 MED ORDER — CEPHALEXIN 500 MG PO CAPS: 500.0000 mg | ORAL_CAPSULE | Freq: Three times a day (TID) | ORAL | Status: DC

## 2015-01-18 ENCOUNTER — Telehealth (HOSPITAL_COMMUNITY): Payer: Self-pay

## 2015-01-18 NOTE — ED Notes (Signed)
Positive for gonorrhea and chlamydia. Chart sent to edp office for review.

## 2015-01-19 NOTE — Telephone Encounter (Signed)
Post ED Visit - Positive Culture Follow-up: Successful Patient Follow-Up  Positive Gonorrhea culture Positive Chlamydia culture  []  Patient discharged without antimicrobial prescription and treatment is now indicated [x]  Organism is resistant to prescribed ED discharge antimicrobial []  Patient with positive blood cultures  Changes discussed with ED provider: Trixie Dredge, PA New antibiotic prescription:  Cefixime 400 mg PO once, Azithromycin one gram PO once   Attempting to contact patient  Jiles Harold 01/19/2015, 2:25 PM

## 2015-01-20 ENCOUNTER — Telehealth (HOSPITAL_COMMUNITY): Payer: Self-pay

## 2015-01-20 NOTE — Telephone Encounter (Signed)
Chart returned 6/14. Trixie Dredge reviewed. Pt needs cefixime 400 mg po x 1 and azithromycin 1 gram po x 1.. Will need to be retested after treatment by health department.  Attempting to notify pt

## 2015-01-21 ENCOUNTER — Telehealth (HOSPITAL_BASED_OUTPATIENT_CLINIC_OR_DEPARTMENT_OTHER): Payer: Self-pay | Admitting: Emergency Medicine

## 2015-02-08 ENCOUNTER — Telehealth (HOSPITAL_COMMUNITY): Payer: Self-pay

## 2015-02-08 NOTE — ED Notes (Signed)
Unable to contact pt by mail or telephone. Unable to communicate lab results or treatment changes. 

## 2015-03-09 ENCOUNTER — Encounter: Payer: Self-pay | Admitting: Emergency Medicine

## 2015-03-09 ENCOUNTER — Emergency Department: Payer: Medicaid Other

## 2015-03-09 ENCOUNTER — Emergency Department
Admission: EM | Admit: 2015-03-09 | Discharge: 2015-03-09 | Disposition: A | Discharge status: Home / Self Care | Payer: Medicaid Other | Attending: Emergency Medicine | Admitting: Emergency Medicine

## 2015-03-09 DIAGNOSIS — Z791 Long term (current) use of non-steroidal anti-inflammatories (NSAID): Secondary | ICD-10-CM | POA: Insufficient documentation

## 2015-03-09 DIAGNOSIS — S63502A Unspecified sprain of left wrist, initial encounter: Secondary | ICD-10-CM

## 2015-03-09 DIAGNOSIS — Y998 Other external cause status: Secondary | ICD-10-CM | POA: Insufficient documentation

## 2015-03-09 DIAGNOSIS — Y9389 Activity, other specified: Secondary | ICD-10-CM | POA: Insufficient documentation

## 2015-03-09 DIAGNOSIS — Y9289 Other specified places as the place of occurrence of the external cause: Secondary | ICD-10-CM | POA: Insufficient documentation

## 2015-03-09 DIAGNOSIS — X58XXXA Exposure to other specified factors, initial encounter: Secondary | ICD-10-CM | POA: Insufficient documentation

## 2015-03-09 DIAGNOSIS — Z792 Long term (current) use of antibiotics: Secondary | ICD-10-CM | POA: Insufficient documentation

## 2015-03-09 MED ORDER — TRAMADOL HCL 50 MG PO TABS
50.0000 mg | ORAL_TABLET | Freq: Four times a day (QID) | ORAL | Status: DC | PRN
Start: 2015-03-09 — End: 2016-02-11

## 2015-03-09 MED ORDER — TRAMADOL HCL 50 MG PO TABS
50.0000 mg | ORAL_TABLET | Freq: Once | ORAL | Status: AC
  Administered 2015-03-09: 50 mg via ORAL
  Filled 2015-03-09: qty 1

## 2015-03-09 NOTE — ED Notes (Signed)
Patient present to ED with c/o left wrist pain x 2 weeks, reports she bent it forward and has been having increased pain since then. Reports she has been taking ibuprofen for pain without relief. Reports pain is now shooting to elbow and fingertips.Patient able to open and close fist, move wrist with pain. Good bilateral pulses felt, capillary refill less than 3 seconds. No swelling or obvious deformity noted. Patient denies any other complaints. Patient alert and oriented x 4, respirations even and unlabored. MD at bedside.

## 2015-03-09 NOTE — ED Notes (Signed)
Cock-up splint applied to left wrist per verbal order of Dr. Zenda Alpers. Patient tolerated procedure well.

## 2015-03-09 NOTE — ED Provider Notes (Signed)
[facility] Emergency Department Provider Note  ____________________________________________  Time seen: Approximately 222 AM  I have reviewed the triage vital signs and the nursing notes.   HISTORY  Chief Complaint Wrist Pain    HPI Alexa Guerra is a 23 y.o. female who comes in with left wrist pain. The patient reports that she hyperflexed her wrist a few weeks ago. She reports that it has not been getting any better. She reports that the pain has been shooting into her fingers into her elbow. She reports that she's been taking ibuprofen but the pain has not been getting better. Initially it was swollen but it has not been swollen recently. She reports that her pain as a 9 out of 10 in intensity. She has not been icing her wrist or wrapping it in an Ace wrap. The patient reports that since it was going on for so long she would come in and get it checked out.   Past Medical History  Diagnosis Date  . Hx of varicella   . Ovarian cyst   . Double aortic arch   . Double aortic arch     There are no active problems to display for this patient.   Past Surgical History  Procedure Laterality Date  . No past surgeries      Current Outpatient Rx  Name  Route  Sig  Dispense  Refill  . cephALEXin (KEFLEX) 500 MG capsule   Oral   Take 1 capsule (500 mg total) by mouth 3 (three) times daily.   21 capsule   0   . HYDROcodone-acetaminophen (NORCO) 5-325 MG per tablet   Oral   Take 1 tablet by mouth every 6 (six) hours as needed for moderate pain.   10 tablet   0   . naproxen (NAPROSYN) 500 MG tablet   Oral   Take 1 tablet (500 mg total) by mouth 2 (two) times daily.   30 tablet   0   . traMADol (ULTRAM) 50 MG tablet   Oral   Take 1 tablet (50 mg total) by mouth every 6 (six) hours as needed.   12 tablet   0     Allergies Beta adrenergic blockers  Family History  Problem Relation Age of Onset  . Drug abuse Mother   . Cancer Mother      cervix  . Alcohol abuse Father   . Hypertension Maternal Grandmother   . Diabetes Maternal Grandmother   . Hypertension Maternal Grandfather   . Diabetes Maternal Grandfather   . Hypertension Paternal Grandmother   . Hypertension Paternal Grandfather     Social History History  Substance Use Topics  . Smoking status: Never Smoker   . Smokeless tobacco: Never Used  . Alcohol Use: No    Review of Systems Constitutional: No fever/chills Eyes: No visual changes. ENT: No sore throat. Cardiovascular: Denies chest pain. Respiratory: Denies shortness of breath. Gastrointestinal: No abdominal pain.  No nausea, no vomiting.  No diarrhea.  No constipation. Genitourinary: Negative for dysuria. Musculoskeletal: Left wrist pain. Skin: Negative for rash. Neurological: Negative for headaches, focal weakness or numbness.  10-point ROS otherwise negative.  ____________________________________________   PHYSICAL EXAM:  VITAL SIGNS: ED Triage Vitals  Enc Vitals Group     BP 03/09/15 0152 120/51 mmHg     Pulse Rate 03/09/15 0152 86     Resp 03/09/15 0152 18     Temp 03/09/15 0152 97.9 F (36.6 C)     Temp  Source 03/09/15 0152 Oral     SpO2 03/09/15 0152 98 %     Weight 03/09/15 0152 137 lb (62.143 kg)     Height 03/09/15 0152  (1.626 m)     Head Cir --      Peak Flow --      Pain Score 03/09/15 0153 9     Pain Loc --      Pain Edu? --      Excl. in GC? --     Constitutional: Alert and oriented. Well appearing and in mild distress. Eyes: Conjunctivae are normal. PERRL. EOMI. Head: Atraumatic. Nose: No congestion/rhinnorhea. Mouth/Throat: Mucous membranes are moist.  Oropharynx non-erythematous. Cardiovascular: Normal rate, regular rhythm. Grossly normal heart sounds.  Good peripheral circulation. Respiratory: Normal respiratory effort.  No retractions. Lungs CTAB. Gastrointestinal: Soft and nontender. No distention. Positive bowel sounds Genitourinary:  Deferred Musculoskeletal: No lower extremity tenderness nor edema.  No joint effusions. Tenderness to palpation of her left wrist with pain with passive range of motion. Neurologic:  Normal speech and language.  Skin:  Skin is warm, dry and intact. No rash noted. Psychiatric: Mood and affect are normal.  ____________________________________________   LABS (all labs ordered are listed, but only abnormal results are displayed)  Labs Reviewed - No data to display ____________________________________________  EKG  None ____________________________________________  RADIOLOGY  Left wrist x-ray: No evidence of fracture or dislocation ____________________________________________   PROCEDURES  Procedure(s) performed: None  Critical Care performed: No  ____________________________________________   INITIAL IMPRESSION / ASSESSMENT AND PLAN / ED COURSE  Pertinent labs & imaging results that were available during my care of the patient were reviewed by me and considered in my medical decision making (see chart for details).  The patient is a 25 year old female who injured her wrist a few weeks ago and comes in with some left wrist pain. The patient does not have a fracture on her x-ray but I will place the patient in a Velcro splint and have her follow up with orthopedic surgery. I did give the patient a dose of tramadol would give her some tramadol for home as well. ____________________________________________   FINAL CLINICAL IMPRESSION(S) / ED DIAGNOSES  Final diagnoses:  Wrist sprain, left, initial encounter      Rebecka Apley, MD 03/09/15 [phone]

## 2015-03-09 NOTE — ED Notes (Signed)
Patient ambulatory to triage with steady gait, without difficulty or distress noted; pt reports left wrist pain after "bending it wrong" few weeks ago; +radial pulse, brisk cap refill, W&D, good movem/sensation noted

## 2015-03-09 NOTE — Discharge Instructions (Signed)
Wrist Pain °Wrist injuries are frequent in adults and children. A sprain is an injury to the ligaments that hold your bones together. A strain is an injury to muscle or muscle cord-like structures (tendons) from stretching or pulling. Generally, when wrists are moderately tender to touch following a fall or injury, a break in the bone (fracture) may be present. Most wrist sprains or strains are better in 3 to 5 days, but complete healing may take several weeks. °HOME CARE INSTRUCTIONS  °· Put ice on the injured area. °· Put ice in a plastic bag. °· Place a towel between your skin and the bag. °· Leave the ice on for 15-20 minutes, 3-4 times a day, for the first 2 days, or as directed by your health care provider. °· Keep your arm raised above the level of your heart whenever possible to reduce swelling and pain. °· Rest the injured area for at least 48 hours or as directed by your health care provider. °· If a splint or elastic bandage has been applied, use it for as long as directed by your health care provider or until seen by a health care provider for a follow-up exam. °· Only take over-the-counter or prescription medicines for pain, discomfort, or fever as directed by your health care provider. °· Keep all follow-up appointments. You may need to follow up with a specialist or have follow-up X-rays. Improvement in pain level is not a guarantee that you did not fracture a bone in your wrist. The only way to determine whether or not you have a broken bone is by X-ray. °SEEK IMMEDIATE MEDICAL CARE IF:  °· Your fingers are swollen, very red, white, or cold and blue. °· Your fingers are numb or tingling. °· You have increasing pain. °· You have difficulty moving your fingers. °MAKE SURE YOU:  °· Understand these instructions. °· Will watch your condition. °· Will get help right away if you are not doing well or get worse. °Document Released: 05/03/2005 Document Revised: 07/29/2013 Document Reviewed:  09/14/2010 °Exit[employer]® Patient Information ©2015 [employer]. This information is not intended to replace advice given to you by your health care provider. Make sure you discuss any questions you have with your health care provider. °Wrist Splint °A wrist splint is a brace that holds your wrist in a fixed position. It can be used to stabilize your wrist so that broken bones and sprains can heal faster, with less pain. It can also help to relieve pressure on the nerve that runs down the middle of your arm (median nerve). °Splints are available in drugstores without a prescription. They are also available by prescription from orthopedic and medical supply stores. Custom splints made from lightweight materials can be made by physical or occupational therapist. °HOME CARE INSTRUCTIONS °· Wear your splint as instructed by your caregiver. It may be worn while you sleep. °· Your caregiver may instruct you how to perform certain exercises at home. These exercises help maintain muscle strength in your hand and wrist. They also help to maintain motion in your fingers. °SEEK MEDICAL CARE IF: °· You start to lose feeling in your hand or fingers. °· Your skin or fingernails turn blue or gray, or they feel cold. °MAKE SURE YOU:  °· Understand these instructions. °· Will watch your condition. °· Will get help right away if you are not doing well or get worse. °Document Released: 07/06/2006 Document Revised: 10/16/2011 Document Reviewed: 11/04/2013 °[employer]® Patient Information ©2015 [employer]. This information is   not intended to replace advice given to you by your health care provider. Make sure you discuss any questions you have with your health care provider. ° °

## 2015-03-09 NOTE — ED Notes (Signed)

## 2015-06-01 ENCOUNTER — Encounter (HOSPITAL_COMMUNITY): Payer: Self-pay | Admitting: Family Medicine

## 2015-06-01 ENCOUNTER — Emergency Department (HOSPITAL_COMMUNITY)
Admission: EM | Admit: 2015-06-01 | Discharge: 2015-06-02 | Disposition: A | Discharge status: Home / Self Care | Payer: Self-pay | Attending: Emergency Medicine | Admitting: Emergency Medicine

## 2015-06-01 DIAGNOSIS — Z8619 Personal history of other infectious and parasitic diseases: Secondary | ICD-10-CM | POA: Insufficient documentation

## 2015-06-01 DIAGNOSIS — Z720 Tobacco use: Secondary | ICD-10-CM | POA: Insufficient documentation

## 2015-06-01 DIAGNOSIS — Z3202 Encounter for pregnancy test, result negative: Secondary | ICD-10-CM | POA: Insufficient documentation

## 2015-06-01 DIAGNOSIS — N83201 Unspecified ovarian cyst, right side: Secondary | ICD-10-CM | POA: Insufficient documentation

## 2015-06-01 DIAGNOSIS — Z792 Long term (current) use of antibiotics: Secondary | ICD-10-CM | POA: Insufficient documentation

## 2015-06-01 DIAGNOSIS — Z791 Long term (current) use of non-steroidal anti-inflammatories (NSAID): Secondary | ICD-10-CM | POA: Insufficient documentation

## 2015-06-01 DIAGNOSIS — Q2545 Double aortic arch: Secondary | ICD-10-CM | POA: Insufficient documentation

## 2015-06-01 DIAGNOSIS — N83519 Torsion of ovary and ovarian pedicle, unspecified side: Secondary | ICD-10-CM

## 2015-06-01 LAB — POC URINE PREG, ED: Preg Test, Ur: NEGATIVE

## 2015-06-01 NOTE — ED Notes (Signed)
Patient states she is experiencing lower abd and back pain. Pain started about a week ago. Also, having vaginal bleeding. Vaginal bleeding started a week ago. Pt states it feels like an ovarian cyst about 2 months ago.

## 2015-06-02 ENCOUNTER — Emergency Department (HOSPITAL_COMMUNITY): Payer: Medicaid Other

## 2015-06-02 LAB — GC/CHLAMYDIA PROBE AMP (~~LOC~~) NOT AT ARMC
Chlamydia: POSITIVE — AB
Neisseria Gonorrhea: NEGATIVE

## 2015-06-02 LAB — URINE MICROSCOPIC-ADD ON

## 2015-06-02 LAB — URINALYSIS, ROUTINE W REFLEX MICROSCOPIC
Bilirubin Urine: NEGATIVE
Glucose, UA: NEGATIVE mg/dL
Ketones, ur: NEGATIVE mg/dL
LEUKOCYTES UA: NEGATIVE
NITRITE: NEGATIVE
Protein, ur: NEGATIVE mg/dL
SPECIFIC GRAVITY, URINE: 1.019 (ref 1.005–1.030)
UROBILINOGEN UA: 0.2 mg/dL (ref 0.0–1.0)
pH: 6 (ref 5.0–8.0)

## 2015-06-02 LAB — WET PREP, GENITAL
Clue Cells Wet Prep HPF POC: NONE SEEN
Trich, Wet Prep: NONE SEEN
YEAST WET PREP: NONE SEEN

## 2015-06-02 MED ORDER — KETOROLAC TROMETHAMINE 30 MG/ML IJ SOLN
30.0000 mg | Freq: Once | INTRAMUSCULAR | Status: AC
  Administered 2015-06-02: 30 mg via INTRAVENOUS
  Filled 2015-06-02: qty 1

## 2015-06-02 MED ORDER — HYDROCODONE-ACETAMINOPHEN 5-325 MG PO TABS: 1.0000 | ORAL_TABLET | Freq: Four times a day (QID) | ORAL | Status: DC | PRN

## 2015-06-02 MED ORDER — SODIUM CHLORIDE 0.9 % IV SOLN
Freq: Once | INTRAVENOUS | Status: AC
Start: 2015-06-02 — End: 2015-06-02
  Administered 2015-06-02: 02:00:00 via INTRAVENOUS

## 2015-06-02 MED ORDER — HYDROCODONE-ACETAMINOPHEN 5-325 MG PO TABS
1.0000 | ORAL_TABLET | Freq: Once | ORAL | Status: AC
  Administered 2015-06-02: 1 via ORAL
  Filled 2015-06-02: qty 1

## 2015-06-02 NOTE — ED Provider Notes (Signed)
CSN: [id number]     Arrival date & time 06/01/15  2228 History   First MD Initiated Contact with Patient 06/02/15 0125     Chief Complaint  Patient presents with  . Abdominal Pain  . Vaginal Bleeding     (Consider location/radiation/quality/duration/timing/severity/associated sxs/prior Treatment) HPI Comments: This is a 24 year old female who reports for the past 2 weeks.  She's had intermittent vaginal bleeding.  She said that would stop for a day or 2 and then start again has one sexual partner is not concerned for an STD.  She does size any vaginal discharge prior to the vaginal bleeding.  She states she had an ovarian cyst about 2 months ago that presented the same way she has been taking ibuprofen without any relief of her discomfort.  She has not called her OB/GYN or primary care provider and tonight the pain got worse.  Patient is a 24 y.o. female presenting with abdominal pain and vaginal bleeding. The history is provided by the patient.  Abdominal Pain Pain location:  Suprapubic Pain quality: aching   Pain radiates to:  Does not radiate Pain severity:  Mild Onset quality:  Gradual Duration:  2 weeks Timing:  Constant Progression:  Worsening Chronicity:  Recurrent Relieved by:  Nothing Worsened by:  Nothing tried Ineffective treatments:  None tried Associated symptoms: vaginal bleeding   Associated symptoms: no chills, no constipation, no diarrhea, no dysuria, no fever, no nausea, no shortness of breath, no vaginal discharge and no vomiting   Vaginal bleeding:    Quality:  Heavier than menses   Severity:  Moderate Vaginal Bleeding Associated symptoms: abdominal pain   Associated symptoms: no dysuria, no fever, no nausea and no vaginal discharge     Past Medical History  Diagnosis Date  . Hx of varicella   . Ovarian cyst   . Double aortic arch   . Double aortic arch    Past Surgical History  Procedure Laterality Date  . No past surgeries     Family History   Problem Relation Age of Onset  . Drug abuse Mother   . Cancer Mother     cervix  . Alcohol abuse Father   . Hypertension Maternal Grandmother   . Diabetes Maternal Grandmother   . Hypertension Maternal Grandfather   . Diabetes Maternal Grandfather   . Hypertension Paternal Grandmother   . Hypertension Paternal Grandfather    Social History  Substance Use Topics  . Smoking status: Current Every Day Smoker -- 0.25 packs/day    Types: Cigarettes  . Smokeless tobacco: Never Used  . Alcohol Use: No   OB History    Gravida Para Term Preterm AB TAB SAB Ectopic Multiple Living   Review of Systems  Constitutional: Negative for fever and chills.  Respiratory: Negative for shortness of breath.   Gastrointestinal: Positive for abdominal pain. Negative for nausea, vomiting, diarrhea and constipation.  Genitourinary: Positive for vaginal bleeding. Negative for dysuria and vaginal discharge.  All other systems reviewed and are negative.     Allergies  Beta adrenergic blockers  Home Medications   Prior to Admission medications   Medication Sig Start Date End Date Taking? Authorizing Provider  cephALEXin (KEFLEX) 500 MG capsule Take 1 capsule (500 mg total) by mouth 3 (three) times daily. 01/15/15   Hope Orlene Och, NP  HYDROcodone-acetaminophen (NORCO/VICODIN) 5-325 MG tablet Take 1 tablet by mouth every 6 (six) hours as  needed for moderate pain. 06/02/15   Earley Favor, NP  naproxen (NAPROSYN) 500 MG tablet Take 1 tablet (500 mg total) by mouth 2 (two) times daily. 01/15/15   Hope Orlene Och, NP  traMADol (ULTRAM) 50 MG tablet Take 1 tablet (50 mg total) by mouth every 6 (six) hours as needed. 03/09/15   Rebecka Apley, MD   BP 107/57 mmHg  Pulse 64  Temp(Src) 98.2 F (36.8 C) (Oral)  Resp 18  Ht  (1.626 m)  Wt 135 lb (61.236 kg)  BMI 23.16 kg/m2  SpO2 100%  LMP  Physical Exam  Constitutional: She appears well-developed and well-nourished.  HENT:  Head:  Normocephalic.  Eyes: Pupils are equal, round, and reactive to light.  Cardiovascular: Normal rate and regular rhythm.   Pulmonary/Chest: Effort normal.  Genitourinary: Cervix exhibits no motion tenderness and no discharge. Right adnexum displays fullness. Left adnexum displays fullness. No vaginal discharge found.  Dark blood in vaginal vault cervical os is closed minimal discomfort/fullness R>L  Musculoskeletal: Normal range of motion. She exhibits no edema or tenderness.  Nursing note and vitals reviewed.   ED Course  Procedures (including critical care time) Labs Review Labs Reviewed  WET PREP, GENITAL - Abnormal; Notable for the following:    WBC, Wet Prep HPF POC RARE (*)    All other components within normal limits  URINALYSIS, ROUTINE W REFLEX MICROSCOPIC (NOT AT [facility]) - Abnormal; Notable for the following:    Hgb urine dipstick LARGE (*)    All other components within normal limits  URINE MICROSCOPIC-ADD ON  POC URINE PREG, ED  GC/CHLAMYDIA PROBE AMP ([place]) NOT AT [facility]    Imaging Review US Transvaginal Non-ob  06/02/2015  CLINICAL DATA:  Bilateral pelvic pain for 1 month. History ovarian cyst, assess for ovarian torsion. EXAM: TRANSABDOMINAL AND TRANSVAGINAL ULTRASOUND OF PELVIS DOPPLER ULTRASOUND OF OVARIES TECHNIQUE: Both transabdominal and transvaginal ultrasound examinations of the pelvis were performed. Transabdominal technique was performed for global imaging of the pelvis including uterus, ovaries, adnexal regions, and pelvic cul-de-sac. It was necessary to proceed with endovaginal exam following the transabdominal exam to visualize the ovaries. Color and duplex Doppler ultrasound was utilized to evaluate blood flow to the ovaries. COMPARISON:  Pelvic ultrasound January 14, 2015 FINDINGS: Uterus Measurements: 8.6 x 9 x 6.3 cm. No fibroids or other mass visualized. Endometrium Thickness: 4 mm.  No focal abnormality visualized. Right ovary Measurements: 3.6 x 2 x 2.7 cm.  Normal appearance/no adnexal mass. 1.8 cm hypoechoic cyst with lace-like internal echoes. Left ovary Measurements: 2.7 x 2 x 2.1 cm. Normal appearance/no adnexal mass. Pulsed Doppler evaluation of both ovaries demonstrates normal low-resistance arterial and venous waveforms. IMPRESSION: 1.8 cm hemorrhagic RIGHT ovarian cyst. No sonographic findings of ovarian torsion. Electronically Signed   By: Awilda Metro M.D.   On: 06/02/2015 03:08   US Pelvis Complete  06/02/2015  CLINICAL DATA:  Bilateral pelvic pain for 1 month. History ovarian cyst, assess for ovarian torsion. EXAM: TRANSABDOMINAL AND TRANSVAGINAL ULTRASOUND OF PELVIS DOPPLER ULTRASOUND OF OVARIES TECHNIQUE: Both transabdominal and transvaginal ultrasound examinations of the pelvis were performed. Transabdominal technique was performed for global imaging of the pelvis including uterus, ovaries, adnexal regions, and pelvic cul-de-sac. It was necessary to proceed with endovaginal exam following the transabdominal exam to visualize the ovaries. Color and duplex Doppler ultrasound was utilized to evaluate blood flow to the ovaries. COMPARISON:  Pelvic ultrasound January 14, 2015 FINDINGS: Uterus Measurements: 8.6 x 9 x 6.3 cm.  No fibroids or other mass visualized. Endometrium Thickness: 4 mm.  No focal abnormality visualized. Right ovary Measurements: 3.6 x 2 x 2.7 cm. Normal appearance/no adnexal mass. 1.8 cm hypoechoic cyst with lace-like internal echoes. Left ovary Measurements: 2.7 x 2 x 2.1 cm. Normal appearance/no adnexal mass. Pulsed Doppler evaluation of both ovaries demonstrates normal low-resistance arterial and venous waveforms. IMPRESSION: 1.8 cm hemorrhagic RIGHT ovarian cyst. No sonographic findings of ovarian torsion. Electronically Signed   By: Awilda Metroourtnay  Bloomer M.D.   On: 06/02/2015 03:08   I have personally reviewed and evaluated these images and lab results as part of my medical decision-making.   EKG Interpretation None      MDM    Final diagnoses:  Right ovarian cyst         Earley FavorGail Bayla Mcgovern, NP 06/02/15 [id number]  Cy BlamerApril Palumbo, MD 06/02/15 [id number]

## 2015-06-02 NOTE — Discharge Instructions (Signed)
Your ultrasound shows that you have a hemorrhagic right ovarian cyst.  Please call your OB/GYN for further evaluation.  He been given a prescription for Vicodin for pain control.

## 2015-06-03 ENCOUNTER — Telehealth (HOSPITAL_COMMUNITY): Payer: Self-pay

## 2015-06-03 NOTE — Telephone Encounter (Signed)
Positive for chlamydia. Chart sent to EDP office for review.  

## 2015-06-05 ENCOUNTER — Telehealth (HOSPITAL_BASED_OUTPATIENT_CLINIC_OR_DEPARTMENT_OTHER): Payer: Self-pay | Admitting: Emergency Medicine

## 2015-06-06 ENCOUNTER — Telehealth (HOSPITAL_BASED_OUTPATIENT_CLINIC_OR_DEPARTMENT_OTHER): Payer: Self-pay | Admitting: Emergency Medicine

## 2015-06-06 NOTE — Telephone Encounter (Signed)
Post ED Visit - Positive Culture Follow-up: Successful Patient Follow-Up  Culture assessed and recommendations reviewed by: []  Celedonio MiyamotoJeremy Frens, Pharm.D., BCPS-AQ ID []  Georgina PillionElizabeth Martin, Pharm.D., BCPS []  SlaterMinh Pham, [address]Ph[address]D., BCPS, AAHIVP []  Estella HuskMichelle Turner, Pharm.D., BCPS, AAHIVP []  Smethportristy Reyes, [address]Pharm[address] []  Tennis Mustassie Stewart, Pharm.D.  Positive chlamydia culture  [x]  Patient discharged without antimicrobial prescription and treatment is now indicated []  Organism is resistant to prescribed ED discharge antimicrobial []  Patient with positive blood cultures  Changes discussed with ED provider: Doug SouSam Jacubowitz PA New antibiotic prescription: Zithromax 1 gram po once [facility] [name]  Contacted patient, 06/06/15 1255   [name], [name] 06/06/2015, 12:53 PM

## 2015-06-07 ENCOUNTER — Telehealth (HOSPITAL_COMMUNITY): Payer: Self-pay

## 2015-08-26 ENCOUNTER — Emergency Department
Admission: EM | Admit: 2015-08-26 | Discharge: 2015-08-26 | Disposition: A | Discharge status: Home / Self Care | Payer: Medicaid Other | Attending: Emergency Medicine | Admitting: Emergency Medicine

## 2015-08-26 ENCOUNTER — Other Ambulatory Visit: Payer: Self-pay

## 2015-08-26 ENCOUNTER — Encounter: Payer: Self-pay | Admitting: Emergency Medicine

## 2015-08-26 DIAGNOSIS — F17210 Nicotine dependence, cigarettes, uncomplicated: Secondary | ICD-10-CM | POA: Diagnosis not present

## 2015-08-26 DIAGNOSIS — R202 Paresthesia of skin: Secondary | ICD-10-CM | POA: Insufficient documentation

## 2015-08-26 DIAGNOSIS — Z792 Long term (current) use of antibiotics: Secondary | ICD-10-CM | POA: Diagnosis not present

## 2015-08-26 DIAGNOSIS — H9313 Tinnitus, bilateral: Secondary | ICD-10-CM

## 2015-08-26 DIAGNOSIS — R200 Anesthesia of skin: Secondary | ICD-10-CM | POA: Insufficient documentation

## 2015-08-26 DIAGNOSIS — Z3202 Encounter for pregnancy test, result negative: Secondary | ICD-10-CM | POA: Diagnosis not present

## 2015-08-26 DIAGNOSIS — Z791 Long term (current) use of non-steroidal anti-inflammatories (NSAID): Secondary | ICD-10-CM | POA: Diagnosis not present

## 2015-08-26 LAB — BASIC METABOLIC PANEL
ANION GAP: 7 (ref 5–15)
BUN: 14 mg/dL (ref 6–20)
CHLORIDE: 105 mmol/L (ref 101–111)
CO2: 25 mmol/L (ref 22–32)
CREATININE: 0.69 mg/dL (ref 0.44–1.00)
Calcium: 9.2 mg/dL (ref 8.9–10.3)
GFR calc non Af Amer: >60 mL/min (ref >60)
Glucose, Bld: 91 mg/dL (ref 65–99)
Potassium: 4.1 mmol/L (ref 3.5–5.1)
SODIUM: 137 mmol/L (ref 135–145)

## 2015-08-26 LAB — CBC WITH DIFFERENTIAL/PLATELET
BASOS ABS: 0 10*3/uL (ref 0–0.1)
BASOS PCT: 1 %
Eosinophils Absolute: 0.1 10*3/uL (ref 0–0.7)
Eosinophils Relative: 1 %
HCT: 43.4 % (ref 35.0–47.0)
HEMOGLOBIN: 14.4 g/dL (ref 12.0–16.0)
Lymphocytes Relative: 36 %
Lymphs Abs: 2.6 10*3/uL (ref 1.0–3.6)
MCH: 29.3 pg (ref 26.0–34.0)
MCHC: 33.2 g/dL (ref 32.0–36.0)
MCV: 88.1 fL (ref 80.0–100.0)
Monocytes Absolute: 0.4 10*3/uL (ref 0.2–0.9)
Monocytes Relative: 6 %
NEUTROS PCT: 56 %
Neutro Abs: 4.2 10*3/uL (ref 1.4–6.5)
Platelets: 212 10*3/uL (ref 150–440)
RBC: 4.93 MIL/uL (ref 3.80–5.20)
RDW: 12.7 % (ref 11.5–14.5)
WBC: 7.3 10*3/uL (ref 3.6–11.0)

## 2015-08-26 LAB — URINALYSIS COMPLETE WITH MICROSCOPIC (ARMC ONLY)
BACTERIA UA: NONE SEEN
Bilirubin Urine: NEGATIVE
GLUCOSE, UA: NEGATIVE mg/dL
KETONES UR: NEGATIVE mg/dL
LEUKOCYTES UA: NEGATIVE
NITRITE: NEGATIVE
PROTEIN: NEGATIVE mg/dL
SPECIFIC GRAVITY, URINE: 1.023 (ref 1.005–1.030)
pH: 5 (ref 5.0–8.0)

## 2015-08-26 LAB — POCT PREGNANCY, URINE: Preg Test, Ur: NEGATIVE

## 2015-08-26 NOTE — ED Notes (Signed)
Pt states numbness in her hands and feet and tightness in her left hand, states also pain and ringing in her ears, pt in no distress, awake and alert, states she feels as if its just not enough blood flow

## 2015-08-26 NOTE — ED Notes (Signed)
Patient here complaining of numbness and tingling of all extremeties when she lays down - this has been going on for a couple of week. Ringing in ears X a month with "throbbing pain and ringing". 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 Patient is worried that the symptoms she has could be related to her cardiac history.

## 2015-08-26 NOTE — Discharge Instructions (Signed)
Please seek medical attention for any high fevers, chest pain, shortness of breath, change in behavior, persistent vomiting, bloody stool or any other new or concerning symptoms.   Paresthesia Paresthesia is an abnormal burning or prickling sensation. This sensation is generally felt in the hands, arms, legs, or feet. However, it may occur in any part of the body. Usually, it is not painful. The feeling may be described as:  Tingling or numbness.  Pins and needles.  Skin crawling.  Buzzing.  Limbs falling asleep.  Itching. Most people experience temporary (transient) paresthesia at some time in their lives. Paresthesia may occur when you breathe too quickly (hyperventilation). It can also occur without any apparent cause. Commonly, paresthesia occurs when pressure is placed on a nerve. The sensation quickly goes away after the pressure is removed. For some people, however, paresthesia is a long-lasting (chronic) condition that is caused by an underlying disorder. If you continue to have paresthesia, you may need further medical evaluation. HOME CARE INSTRUCTIONS Watch your condition for any changes. Taking the following actions may help to lessen any discomfort that you are feeling:  Avoid drinking alcohol.  Try acupuncture or massage to help relieve your symptoms.  Keep all follow-up visits as directed by your health care provider. This is important. SEEK MEDICAL CARE IF:  You continue to have episodes of paresthesia.  Your burning or prickling feeling gets worse when you walk.  You have pain, cramps, or dizziness.  You develop a rash. SEEK IMMEDIATE MEDICAL CARE IF:  You feel weak.  You have trouble walking or moving.  You have problems with speech, understanding, or vision.  You feel confused.  You cannot control your bladder or bowel movements.  You have numbness after an injury.  You faint.   This information is not intended to replace advice given to you by  your health care provider. Make sure you discuss any questions you have with your health care provider.   Document Released: 07/14/2002 Document Revised: 12/08/2014 Document Reviewed: 07/20/2014 Elsevier Interactive Patient Education 2016 [employer].  Tinnitus Tinnitus refers to hearing a sound when there is no actual source for that sound. This is often described as ringing in the ears. However, people with this condition may hear a variety of noises. A person may hear the sound in one ear or in both ears.  The sounds of tinnitus can be soft, loud, or somewhere in between. Tinnitus can last for a few seconds or can be constant for days. It may go away without treatment and come back at various times. When tinnitus is constant or happens often, it can lead to other problems, such as trouble sleeping and trouble concentrating. Almost everyone experiences tinnitus at some point. Tinnitus that is long-lasting (chronic) or comes back often is a problem that may require medical attention.  CAUSES  The cause of tinnitus is often not known. In some cases, it can result from other problems or conditions, including:   Exposure to loud noises from machinery, music, or other sources.  Hearing loss.  Ear or sinus infections.  Earwax buildup.  A foreign object in the ear.  Use of certain medicines.  Use of alcohol and caffeine.  High blood pressure.  Heart diseases.  Anemia.  Allergies.  Meniere disease.  Thyroid problems.  Tumors.  An enlarged part of a weakened blood vessel (aneurysm). SYMPTOMS The main symptom of tinnitus is hearing a sound when there is no source for that sound. It may sound like:  Buzzing.  Roaring.  Ringing.  Blowing air, similar to the sound heard when you listen to a seashell.  Hissing.  Whistling.  Sizzling.  Humming.  Running water.  A sustained musical note. DIAGNOSIS  Tinnitus is diagnosed based on your symptoms. Your health care  provider will do a physical exam. A comprehensive hearing exam (audiologic exam) will be done if your tinnitus:   Affects only one ear (unilateral).  Causes hearing difficulties.  Lasts 6 months or longer. You may also need to see a health care provider who specializes in hearing disorders (audiologist). You may be asked to complete a questionnaire to determine the severity of your tinnitus. Tests may be done to help determine the cause and to rule out other conditions. These can include:  Imaging studies of your head and brain, such as:  A CT scan.  An MRI.  An imaging study of your blood vessels (angiogram). TREATMENT  Treating an underlying medical condition can sometimes make tinnitus go away. If your tinnitus continues, other treatments may include:  Medicines, such as certain antidepressants or sleeping aids.  Sound generators to mask the tinnitus. These include:  Tabletop sound machines that play relaxing sounds to help you fall asleep.  Wearable devices that fit in your ear and play sounds or music.  A small device that uses headphones to deliver a signal embedded in music (acoustic neural stimulation). In time, this may change the pathways of your brain and make you less sensitive to tinnitus. This device is used for very severe cases when no other treatment is working.  Therapy and counseling to help you manage the stress of living with tinnitus.  Using hearing aids or cochlear implants, if your tinnitus is related to hearing loss. HOME CARE INSTRUCTIONS  When possible, avoid being in loud places and being exposed to loud sounds.  Wear hearing protection, such as earplugs, when you are exposed to loud noises.  Do not take stimulants, such as nicotine, alcohol, or caffeine.  Practice techniques for reducing stress, such as meditation, yoga, or deep breathing.  Use a white noise machine, a humidifier, or other devices to mask the sound of tinnitus.  Sleep with your  head slightly raised. This may reduce the impact of tinnitus.  Try to get plenty of rest each night. SEEK MEDICAL CARE IF:  You have tinnitus in just one ear.  Your tinnitus continues for 3 weeks or longer without stopping.  Home care measures are not helping.  You have tinnitus after a head injury.  You have tinnitus along with any of the following:  Dizziness.  Loss of balance.  Nausea and vomiting.   This information is not intended to replace advice given to you by your health care provider. Make sure you discuss any questions you have with your health care provider.   Document Released: 07/24/2005 Document Revised: 08/14/2014 Document Reviewed: 12/24/2013 Elsevier Interactive Patient Education [employer]

## 2015-08-26 NOTE — ED Provider Notes (Signed)
[facility] Emergency Department Provider Note   ____________________________________________  Time seen: 1730  I have reviewed the triage vital signs and the nursing notes.   HISTORY  Chief Complaint Tinnitus   History limited by: Not Limited   HPI Alexa Guerra is a 25 y.o. female with history of double aortic arch who presents to the emergency department today because of concern for distal extremity numbness and ringing in her ears. She states that the symptoms have been going on for roughly 1 month. She has noticed that the symptoms are worse at night or when she is lying down. She states that they do come and go. At the time of my examination she is not having any hand pain just in her feet. She states that the ringing in her ears has been fairly constant. She does not feel like she has been having any decreased hearing. She denies any trauma. She denies any neck pain or headache. She states that the symptoms were not particularly worse today and there were no changes today that made her come in.    Past Medical History  Diagnosis Date  . Hx of varicella   . Ovarian cyst   . Double aortic arch   . Double aortic arch     There are no active problems to display for this patient.   Past Surgical History  Procedure Laterality Date  . No past surgeries      Current Outpatient Rx  Name  Route  Sig  Dispense  Refill  . cephALEXin (KEFLEX) 500 MG capsule   Oral   Take 1 capsule (500 mg total) by mouth 3 (three) times daily.   21 capsule   0   . HYDROcodone-acetaminophen (NORCO/VICODIN) 5-325 MG tablet   Oral   Take 1 tablet by mouth every 6 (six) hours as needed for moderate pain.   30 tablet   0   . naproxen (NAPROSYN) 500 MG tablet   Oral   Take 1 tablet (500 mg total) by mouth 2 (two) times daily.   30 tablet   0   . traMADol (ULTRAM) 50 MG tablet   Oral   Take 1 tablet (50 mg total) by mouth every 6 (six) hours as needed.   12  tablet   0     Allergies Beta adrenergic blockers  Family History  Problem Relation Age of Onset  . Drug abuse Mother   . Cancer Mother     cervix  . Alcohol abuse Father   . Hypertension Maternal Grandmother   . Diabetes Maternal Grandmother   . Hypertension Maternal Grandfather   . Diabetes Maternal Grandfather   . Hypertension Paternal Grandmother   . Hypertension Paternal Grandfather     Social History Social History  Substance Use Topics  . Smoking status: Current Every Day Smoker -- 0.25 packs/day    Types: Cigarettes  . Smokeless tobacco: Never Used  . Alcohol Use: Yes     Comment: Occasionally    Review of Systems  Constitutional: Negative for fever. Cardiovascular: Negative for chest pain. Respiratory: Negative for shortness of breath. Gastrointestinal: Negative for abdominal pain, vomiting and diarrhea. Neurological: Bilateral hand and foot numbness. Ringing in her ears.   10-point ROS otherwise negative.  ____________________________________________   PHYSICAL EXAM:  VITAL SIGNS: ED Triage Vitals  Enc Vitals Group     BP 08/26/15 1451 116/66 mmHg     Pulse Rate 08/26/15 1451 70     Resp --  Temp 08/26/15 1451 98.1 F (36.7 C)     Temp Source 08/26/15 1451 Oral     SpO2 08/26/15 1451 100 %     Weight 08/26/15 1451 125 lb (56.7 kg)     Height 08/26/15 1451  (1.626 m)   Constitutional: Alert and oriented. Well appearing and in no distress. Eyes: Conjunctivae are normal. PERRL. Normal extraocular movements. ENT   Head: Normocephalic and atraumatic. Tympanic membranes without erythema, bulging or fluid bilaterally.   Nose: No congestion/rhinnorhea.   Mouth/Throat: Mucous membranes are moist.   Neck: No stridor. Hematological/Lymphatic/Immunilogical: No cervical lymphadenopathy. Cardiovascular: Normal rate, regular rhythm.  No murmurs, rubs, or gallops. Respiratory: Normal respiratory effort without tachypnea nor  retractions. Breath sounds are clear and equal bilaterally. No wheezes/rales/rhonchi. Gastrointestinal: Soft and nontender. No distention.  Genitourinary: Deferred Musculoskeletal: Normal range of motion in all extremities. No joint effusions.  No lower extremity tenderness nor edema. Neurologic:  Normal speech and language. No gross focal neurologic deficits are appreciated.  Skin:  Skin is warm, dry and intact. No rash noted. Psychiatric: Mood and affect are normal. Speech and behavior are normal. Patient exhibits appropriate insight and judgment.  ____________________________________________    LABS (pertinent positives/negatives)  Labs Reviewed  URINALYSIS COMPLETEWITH MICROSCOPIC ([facility] ONLY) - Abnormal; Notable for the following:    Color, Urine YELLOW (*)    APPearance CLEAR (*)    Hgb urine dipstick 1+ (*)    Squamous Epithelial / LPF 0-5 (*)    All other components within normal limits  CBC WITH DIFFERENTIAL/PLATELET  BASIC METABOLIC PANEL  POCT PREGNANCY, URINE     ____________________________________________   EKG  I, Phineas Semen, attending physician, personally viewed and interpreted this EKG  EKG Time: 1504 Rate: 72 Rhythm: normal sinus rhythm Axis: normal Intervals: qtc 387 QRS: narrow ST changes: no st elevation Impression: normal ekg ____________________________________________    RADIOLOGY  None   ____________________________________________   PROCEDURES  Procedure(s) performed: None  Critical Care performed: No  ____________________________________________   INITIAL IMPRESSION / ASSESSMENT AND PLAN / ED COURSE  Pertinent labs & imaging results that were available during my care of the patient were reviewed by me and considered in my medical decision making (see chart for details).  Patient presented to the emergency department with concerns for numbness in her distal extremities and tinnitus. On physical exam here no concerning  findings. Additionally blood work without any concerning findings. EKG within normal limits. At this point I do not have a great explanation for the patient's symptoms. Given that they've been going on for a month however I feel like she does not require an inpatient workup. Instructed the patient to follow-up with her cardiologist in [place] and will also give ENTs number.  ____________________________________________   FINAL CLINICAL IMPRESSION(S) / ED DIAGNOSES  Final diagnoses:  Paresthesia  Tinnitus, bilateral     Phineas Semen, MD 08/26/15 1850

## 2015-10-25 ENCOUNTER — Emergency Department
Admission: EM | Admit: 2015-10-25 | Discharge: 2015-10-25 | Disposition: A | Discharge status: Home / Self Care | Payer: Medicaid Other | Attending: Emergency Medicine | Admitting: Emergency Medicine

## 2015-10-25 ENCOUNTER — Encounter: Payer: Self-pay | Admitting: Emergency Medicine

## 2015-10-25 DIAGNOSIS — F17210 Nicotine dependence, cigarettes, uncomplicated: Secondary | ICD-10-CM | POA: Insufficient documentation

## 2015-10-25 DIAGNOSIS — H7493 Unspecified disorder of middle ear and mastoid, bilateral: Secondary | ICD-10-CM | POA: Diagnosis not present

## 2015-10-25 DIAGNOSIS — R05 Cough: Secondary | ICD-10-CM | POA: Diagnosis present

## 2015-10-25 DIAGNOSIS — J0100 Acute maxillary sinusitis, unspecified: Secondary | ICD-10-CM | POA: Insufficient documentation

## 2015-10-25 MED ORDER — AMOXICILLIN 875 MG PO TABS
875.0000 mg | ORAL_TABLET | Freq: Two times a day (BID) | ORAL | Status: DC
Start: 2015-10-25 — End: 2016-02-11

## 2015-10-25 MED ORDER — PREDNISONE 20 MG PO TABS: ORAL_TABLET | ORAL | Status: DC

## 2015-10-25 NOTE — ED Provider Notes (Signed)
[facility] Emergency Department Provider Note  ____________________________________________  Time seen: Approximately 2:24 PM  I have reviewed the triage vital signs and the nursing notes.   HISTORY  Chief Complaint Sore Throat; Cough; and Nasal Congestion    HPI Alexa Guerra is a 25 y.o. female , NAD, presents to the emergency department with 3 week history of cough, congestion, sore throat and sinus pressure. Has been taking over-the-counter medications which seemed to help but now is feeling worse. Denies any fever, chills, body aches. States her husband was sick prior to her illness beginning and feels like she got it from him. Is not having any chest pain, back pain, abdominal pain, nausea, vomiting, diarrhea.   Past Medical History  Diagnosis Date  . Hx of varicella   . Ovarian cyst   . Double aortic arch   . Double aortic arch     There are no active problems to display for this patient.   Past Surgical History  Procedure Laterality Date  . No past surgeries      Current Outpatient Rx  Name  Route  Sig  Dispense  Refill  . amoxicillin (AMOXIL) 875 MG tablet   Oral   Take 1 tablet (875 mg total) by mouth 2 (two) times daily.   20 tablet   0   . HYDROcodone-acetaminophen (NORCO/VICODIN) 5-325 MG tablet   Oral   Take 1 tablet by mouth every 6 (six) hours as needed for moderate pain.   30 tablet   0   . predniSONE (DELTASONE) 20 MG tablet      Take 2 tablets by mouth, once daily, for 5 days   10 tablet   0   . traMADol (ULTRAM) 50 MG tablet   Oral   Take 1 tablet (50 mg total) by mouth every 6 (six) hours as needed.   12 tablet   0     Allergies Beta adrenergic blockers  Family History  Problem Relation Age of Onset  . Drug abuse Mother   . Cancer Mother     cervix  . Alcohol abuse Father   . Hypertension Maternal Grandmother   . Diabetes Maternal Grandmother   . Hypertension Maternal Grandfather   . Diabetes  Maternal Grandfather   . Hypertension Paternal Grandmother   . Hypertension Paternal Grandfather     Social History Social History  Substance Use Topics  . Smoking status: Current Every Day Smoker -- 0.25 packs/day    Types: Cigarettes  . Smokeless tobacco: Never Used  . Alcohol Use: Yes     Comment: Occasionally     Review of Systems  Constitutional: No fatigue. No fever/chills Eyes: No visual changes. No discharge, swelling, erythema. ENT: Positive sinus pressure, nasal congestion, sore throat. No runny nose, ear pain Cardiovascular: No chest pain. Respiratory: The chest congestion, cough. No shortness of breath. No wheezing.  Gastrointestinal: No abdominal pain.  No nausea, vomiting.  No diarrhea.  Musculoskeletal: Negative for back pain nor general myalgias.  Skin: Negative for rash. Neurological: Negative for headaches, focal weakness or numbness. 10-point ROS otherwise negative.  ____________________________________________   PHYSICAL EXAM:  VITAL SIGNS: ED Triage Vitals  Enc Vitals Group     BP 10/25/15 1339 109/53 mmHg     Pulse Rate 10/25/15 1339 65     Resp 10/25/15 1339 20     Temp 10/25/15 1339 97.8 F (36.6 C)     Temp Source 10/25/15 1339 Oral     SpO2  10/25/15 1339 100 %     Weight 10/25/15 1339 135 lb (61.236 kg)     Height 10/25/15 1339  (1.626 m)     Head Cir --      Peak Flow --      Pain Score 10/25/15 1341 3     Pain Loc --      Pain Edu? --      Excl. in GC? --     Constitutional: Alert and oriented. Well appearing and in no acute distress. Eyes: Conjunctivae are normal. PERRL. EOMI without pain.  Head: Atraumatic. ENT:  Bilateral maxillary sinus tenderness to palpation.      Ears: TMs visualized bilaterally with mild injection, moderate serous effusion and mild bulging. No perforation, discharge.      Nose: Moderate congestion/rhinnorhea.      Mouth/Throat: Mucous membranes are moist. Pharynx with clear his nasal drip. No  pharyngeal erythema, swelling, exudates. Neck: Supple with full range of motion. Hematological/Lymphatic/Immunilogical: No cervical lymphadenopathy. Cardiovascular: Normal rate, regular rhythm. Normal S1 and S2.  Good peripheral circulation. Respiratory: Normal respiratory effort without tachypnea or retractions. Lungs CTAB with breath sounds noted throughout. Neurologic:  Normal speech and language. No gross focal neurologic deficits are appreciated.  Skin:  Skin is warm, dry and intact. No rash noted. Psychiatric: Mood and affect are normal. Speech and behavior are normal. Patient exhibits appropriate insight and judgement.   ____________________________________________   LABS  None ____________________________________________  EKG  None ____________________________________________  RADIOLOGY  None ____________________________________________    PROCEDURES  Procedure(s) performed: None    Medications - No data to display   ____________________________________________   INITIAL IMPRESSION / ASSESSMENT AND PLAN / ED COURSE  Patient's diagnosis is consistent with acute maxillary sinusitis. Patient will be discharged home with prescriptions for amoxicillin and prednisone to take as directed. May take Tylenol over-the-counter as needed for pain. Patient is to follow up with her primary care provider or [facility] if symptoms persist past this treatment course. Patient is given ED precautions to return to the ED for any worsening or new symptoms.    ____________________________________________  FINAL CLINICAL IMPRESSION(S) / ED DIAGNOSES  Final diagnoses:  Acute maxillary sinusitis, recurrence not specified      NEW MEDICATIONS STARTED DURING THIS VISIT:  Discharge Medication List as of 10/25/2015  2:42 PM    START taking these medications   Details  amoxicillin (AMOXIL) 875 MG tablet Take 1 tablet (875 mg total) by mouth 2 (two) times daily.,  Starting 10/25/2015, Until Discontinued, Print    predniSONE (DELTASONE) 20 MG tablet Take 2 tablets by mouth, once daily, for 5 days, Print             Hope Pigeon, PA-C 10/25/15 1504  Governor Rooks, MD 10/25/15 1551

## 2015-10-25 NOTE — ED Notes (Signed)
Pt from home with cough, congestion, sore throat x 3 weeks. States she felt some better last week but now is feeling poorly again. NAD noted.

## 2015-10-25 NOTE — Discharge Instructions (Signed)
Sinusitis, Adult °Sinusitis is redness, soreness, and inflammation of the paranasal sinuses. Paranasal sinuses are air pockets within the bones of your face. They are located beneath your eyes, in the middle of your forehead, and above your eyes. In healthy paranasal sinuses, mucus is able to drain out, and air is able to circulate through them by way of your nose. However, when your paranasal sinuses are inflamed, mucus and air can become trapped. This can allow bacteria and other germs to grow and cause infection. °Sinusitis can develop quickly and last only a short time (acute) or continue over a long period (chronic). Sinusitis that lasts for more than 12 weeks is considered chronic. °CAUSES °Causes of sinusitis include: °· Allergies. °· Structural abnormalities, such as displacement of the cartilage that separates your nostrils (deviated septum), which can decrease the air flow through your nose and sinuses and affect sinus drainage. °· Functional abnormalities, such as when the small hairs (cilia) that line your sinuses and help remove mucus do not work properly or are not present. °SIGNS AND SYMPTOMS °Symptoms of acute and chronic sinusitis are the same. The primary symptoms are pain and pressure around the affected sinuses. Other symptoms include: °· Upper toothache. °· Earache. °· Headache. °· Bad breath. °· Decreased sense of smell and taste. °· A cough, which worsens when you are lying flat. °· Fatigue. °· Fever. °· Thick drainage from your nose, which often is green and may contain pus (purulent). °· Swelling and warmth over the affected sinuses. °DIAGNOSIS °Your health care provider will perform a physical exam. During your exam, your health care provider may perform any of the following to help determine if you have acute sinusitis or chronic sinusitis: °· Look in your nose for signs of abnormal growths in your nostrils (nasal polyps). °· Tap over the affected sinus to check for signs of  infection. °· View the inside of your sinuses using an imaging device that has a light attached (endoscope). °If your health care provider suspects that you have chronic sinusitis, one or more of the following tests may be recommended: °· Allergy tests. °· Nasal culture. A sample of mucus is taken from your nose, sent to a lab, and screened for bacteria. °· Nasal cytology. A sample of mucus is taken from your nose and examined by your health care provider to determine if your sinusitis is related to an allergy. °TREATMENT °Most cases of acute sinusitis are related to a viral infection and will resolve on their own within 10 days. Sometimes, medicines are prescribed to help relieve symptoms of both acute and chronic sinusitis. These may include pain medicines, decongestants, nasal steroid sprays, or saline sprays. °However, for sinusitis related to a bacterial infection, your health care provider will prescribe antibiotic medicines. These are medicines that will help kill the bacteria causing the infection. °Rarely, sinusitis is caused by a fungal infection. In these cases, your health care provider will prescribe antifungal medicine. °For some cases of chronic sinusitis, surgery is needed. Generally, these are cases in which sinusitis recurs more than 3 times per year, despite other treatments. °HOME CARE INSTRUCTIONS °· Drink plenty of water. Water helps thin the mucus so your sinuses can drain more easily. °· Use a humidifier. °· Inhale steam 3-4 times a day (for example, sit in the bathroom with the shower running). °· Apply a warm, moist washcloth to your face 3-4 times a day, or as directed by your health care provider. °· Use saline nasal sprays to help   moisten and clean your sinuses. °· Take medicines only as directed by your health care provider. °· If you were prescribed either an antibiotic or antifungal medicine, finish it all even if you start to feel better. °SEEK IMMEDIATE MEDICAL CARE IF: °· You have  increasing pain or severe headaches. °· You have nausea, vomiting, or drowsiness. °· You have swelling around your face. °· You have vision problems. °· You have a stiff neck. °· You have difficulty breathing. °  °This information is not intended to replace advice given to you by your health care provider. Make sure you discuss any questions you have with your health care provider. °  °Document Released: 07/24/2005 Document Revised: 08/14/2014 Document Reviewed: 08/08/2011 °Elsevier Interactive Patient Education ©2016 [employer] ° °Sinus Rinse °WHAT IS A SINUS RINSE? °A sinus rinse is a home treatment. It rinses your sinuses with a mixture of salt and water (saline solution). Sinuses are air-filled spaces in your skull behind the bones of your face and forehead. They open into your nasal cavity. °To do a sinus rinse, you will need: °· Saline solution. °· Neti pot or spray bottle. This releases the saline solution into your nose and through your sinuses. You can buy neti pots and spray bottles at: °¨ Your local pharmacy. °¨ A health food store. °¨ Online. °WHEN WOULD I DO A SINUS RINSE?  °A sinus rinse can help to clear your nasal cavity. It can clear:  °· Mucus. °· Dirt. °· Dust. °· Pollen. °You may do a sinus rinse when you have: °· A cold. °· A virus. °· Allergies. °· A sinus infection. °· A stuffy nose. °If you are considering a sinus rinse: °· Ask your child's doctor before doing a sinus rinse on your child. °· Do not do a sinus rinse if you have had: °¨ Ear or nasal surgery. °¨ An ear infection. °¨ Blocked ears. °HOW DO I DO A SINUS RINSE?  °· Wash your hands. °· Disinfect your device using the directions that came with the device. °· Dry your device. °· Use the solution that comes with your device or one that is sold separately in stores. Follow the mixing directions on the package. °· Fill your device with the amount of saline solution as stated in the device instructions. °· Stand over a sink and tilt  your head sideways over the sink. °· Place the spout of the device in your upper nostril (the one closer to the ceiling). °· Gently pour or squeeze the saline solution into the nasal cavity. The liquid should drain to the lower nostril if you are not too congested. °· Gently blow your nose. Blowing too hard may cause ear pain. °· Repeat in the other nostril. °· Clean and rinse your device with clean water. °· Air-dry your device. °ARE THERE RISKS OF A SINUS RINSE?  °Sinus rinse is normally very safe and helpful. However, there are a few risks, which include:  °· A burning feeling in the sinuses. This may happen if you do not make the saline solution as instructed. Make sure to follow all directions when making the saline solution. °· Infection from unclean water. This is rare, but possible. °· Nasal irritation. °  °This information is not intended to replace advice given to you by your health care provider. Make sure you discuss any questions you have with your health care provider. °  °Document Released: 02/18/2014 Document Reviewed: 02/18/2014 °Elsevier Interactive Patient Education ©2016 [employer] ° °

## 2016-02-11 ENCOUNTER — Emergency Department
Admission: EM | Admit: 2016-02-11 | Discharge: 2016-02-11 | Disposition: A | Discharge status: Home / Self Care | Payer: Self-pay | Attending: Emergency Medicine | Admitting: Emergency Medicine

## 2016-02-11 ENCOUNTER — Emergency Department: Payer: Self-pay

## 2016-02-11 DIAGNOSIS — N83201 Unspecified ovarian cyst, right side: Secondary | ICD-10-CM | POA: Insufficient documentation

## 2016-02-11 DIAGNOSIS — R1031 Right lower quadrant pain: Secondary | ICD-10-CM

## 2016-02-11 DIAGNOSIS — F17210 Nicotine dependence, cigarettes, uncomplicated: Secondary | ICD-10-CM | POA: Insufficient documentation

## 2016-02-11 DIAGNOSIS — Z79899 Other long term (current) drug therapy: Secondary | ICD-10-CM | POA: Insufficient documentation

## 2016-02-11 DIAGNOSIS — N83209 Unspecified ovarian cyst, unspecified side: Secondary | ICD-10-CM

## 2016-02-11 LAB — CBC
HCT: 40.1 % (ref 35.0–47.0)
Hemoglobin: 13.9 g/dL (ref 12.0–16.0)
MCH: 30.3 pg (ref 26.0–34.0)
MCHC: 34.6 g/dL (ref 32.0–36.0)
MCV: 87.4 fL (ref 80.0–100.0)
PLATELETS: 300 10*3/uL (ref 150–440)
RBC: 4.59 MIL/uL (ref 3.80–5.20)
RDW: 12.7 % (ref 11.5–14.5)
WBC: 16 10*3/uL — AB (ref 3.6–11.0)

## 2016-02-11 LAB — URINALYSIS COMPLETE WITH MICROSCOPIC (ARMC ONLY)
BILIRUBIN URINE: NEGATIVE
Bacteria, UA: NONE SEEN
Glucose, UA: NEGATIVE mg/dL
HGB URINE DIPSTICK: NEGATIVE
LEUKOCYTES UA: NEGATIVE
NITRITE: NEGATIVE
PH: 5 (ref 5.0–8.0)
Protein, ur: NEGATIVE mg/dL
Specific Gravity, Urine: 1.02 (ref 1.005–1.030)

## 2016-02-11 LAB — COMPREHENSIVE METABOLIC PANEL
ALT: 15 U/L (ref 14–54)
AST: 21 U/L (ref 15–41)
Albumin: 4.5 g/dL (ref 3.5–5.0)
Alkaline Phosphatase: 51 U/L (ref 38–126)
Anion gap: 8 (ref 5–15)
BUN: 9 mg/dL (ref 6–20)
CALCIUM: 9.1 mg/dL (ref 8.9–10.3)
CHLORIDE: 106 mmol/L (ref 101–111)
CO2: 23 mmol/L (ref 22–32)
CREATININE: 0.64 mg/dL (ref 0.44–1.00)
GFR calc non Af Amer: >60 mL/min (ref >60)
Glucose, Bld: 120 mg/dL — ABNORMAL HIGH (ref 65–99)
POTASSIUM: 3.7 mmol/L (ref 3.5–5.1)
SODIUM: 137 mmol/L (ref 135–145)
Total Bilirubin: 1.2 mg/dL (ref 0.3–1.2)
Total Protein: 6.9 g/dL (ref 6.5–8.1)

## 2016-02-11 LAB — LIPASE, BLOOD: LIPASE: 21 U/L (ref 11–51)

## 2016-02-11 LAB — POCT PREGNANCY, URINE: Preg Test, Ur: NEGATIVE

## 2016-02-11 MED ORDER — OXYCODONE-ACETAMINOPHEN 5-325 MG PO TABS
2.0000 | ORAL_TABLET | Freq: Once | ORAL | Status: AC
  Administered 2016-02-11: 2 via ORAL
  Filled 2016-02-11: qty 2

## 2016-02-11 MED ORDER — ONDANSETRON 8 MG PO TBDP
8.0000 mg | ORAL_TABLET | Freq: Once | ORAL | Status: AC
  Administered 2016-02-11: 8 mg via ORAL
  Filled 2016-02-11: qty 1

## 2016-02-11 MED ORDER — HYDROMORPHONE HCL 1 MG/ML IJ SOLN
1.0000 mg | Freq: Once | INTRAMUSCULAR | Status: AC
  Administered 2016-02-11: 1 mg via INTRAVENOUS
  Filled 2016-02-11: qty 1

## 2016-02-11 MED ORDER — ONDANSETRON HCL 4 MG/2ML IJ SOLN
4.0000 mg | Freq: Once | INTRAMUSCULAR | Status: AC
  Administered 2016-02-11: 4 mg via INTRAVENOUS
  Filled 2016-02-11: qty 2

## 2016-02-11 MED ORDER — DEXTROSE 5 % AND 0.9 % NACL IV BOLUS
1000.0000 mL | Freq: Once | INTRAVENOUS | Status: AC
  Administered 2016-02-11: 1000 mL via INTRAVENOUS
  Filled 2016-02-11: qty 1000

## 2016-02-11 MED ORDER — ONDANSETRON 4 MG PO TBDP: 4.0000 mg | ORAL_TABLET | Freq: Three times a day (TID) | ORAL | Status: DC | PRN

## 2016-02-11 MED ORDER — OXYCODONE-ACETAMINOPHEN 5-325 MG PO TABS: 1.0000 | ORAL_TABLET | Freq: Four times a day (QID) | ORAL | Status: DC | PRN

## 2016-02-11 NOTE — ED Notes (Signed)
Pt c/o RLQ pain that started after having intercourse last night, states also having N/V/D..Marland Kitchen

## 2016-02-11 NOTE — ED Notes (Signed)
Pt verbalized understanding of discharge instructions. NAD at this time. 

## 2016-02-11 NOTE — ED Notes (Signed)
Patient transported to Ultrasound 

## 2016-02-11 NOTE — ED Provider Notes (Signed)
[facility] Emergency Department Provider Note  ____________________________________________  Time seen: 12:45 PM  I have reviewed the triage vital signs and the nursing notes.   HISTORY  Chief Complaint Abdominal Pain    HPI Alexa Guerra is a 25 y.o. female who complains of right lower quadrant abdominal pain that started rapidly and became severe last night. Onset was after intercourse with her husband. Also having nausea vomiting and diarrhea since then. Pain has been constant and severe. Nonradiating. States that it feels like her prior ovarian cyst that she had it did not require surgery. LMP was around 01/08/2016 and she is a few days late for her period currently, no abnormal vaginal bleeding or discharge. No dysuria frequency urgency. No aggravating or alleviating factors  Patient also reports decreased oral intake over the last 2-3 days and spent a lot of time outside in the heat.  Past Medical History  Diagnosis Date  . Hx of varicella   . Ovarian cyst   . Double aortic arch   . Double aortic arch      There are no active problems to display for this patient.    Past Surgical History  Procedure Laterality Date  . No past surgeries       Current Outpatient Rx  Name  Route  Sig  Dispense  Refill  . HYDROcodone-acetaminophen (NORCO/VICODIN) 5-325 MG tablet   Oral   Take 1 tablet by mouth every 6 (six) hours as needed for moderate pain.   30 tablet   0   . ondansetron (ZOFRAN ODT) 4 MG disintegrating tablet   Oral   Take 1 tablet (4 mg total) by mouth every 8 (eight) hours as needed for nausea or vomiting.   20 tablet   0   . oxyCODONE-acetaminophen (ROXICET) 5-325 MG tablet   Oral   Take 1 tablet by mouth every 6 (six) hours as needed for severe pain.   12 tablet   0      Allergies Beta adrenergic blockers   Family History  Problem Relation Age of Onset  . Drug abuse Mother   . Cancer Mother     cervix  .  Alcohol abuse Father   . Hypertension Maternal Grandmother   . Diabetes Maternal Grandmother   . Hypertension Maternal Grandfather   . Diabetes Maternal Grandfather   . Hypertension Paternal Grandmother   . Hypertension Paternal Grandfather     Social History Social History  Substance Use Topics  . Smoking status: Current Every Day Smoker -- 0.25 packs/day    Types: Cigarettes  . Smokeless tobacco: Never Used  . Alcohol Use: Yes     Comment: Occasionally    Review of Systems  Constitutional:   No fever or chills.  ENT:   No sore throat. No rhinorrhea. Cardiovascular:   No chest pain. Respiratory:   No dyspnea or cough. Gastrointestinal:   Positive as above for abdominal pain with vomiting and diarrhea.  Genitourinary:   Negative for dysuria or difficulty urinating. Musculoskeletal:   Negative for focal pain or swelling Neurological:   Negative for headaches 10-point ROS otherwise negative.  ____________________________________________   PHYSICAL EXAM:  VITAL SIGNS: ED Triage Vitals  Enc Vitals Group     BP 02/11/16 1056 95/62 mmHg     Pulse Rate 02/11/16 1056 88     Resp 02/11/16 1056 18     Temp 02/11/16 1056 97.6 F (36.4 C)     Temp Source 02/11/16 1056 Oral  SpO2 02/11/16 1056 99 %     Weight 02/11/16 1056 135 lb (61.236 kg)     Height 02/11/16 1056 5\' 4"  (1.626 m)     Head Cir --      Peak Flow --      Pain Score 02/11/16 1057 10     Pain Loc --      Pain Edu? --      Excl. in GC? --     Vital signs reviewed, nursing assessments reviewed.   Constitutional:   Alert and oriented. Uncomfortable but not in distress. Eyes:   No scleral icterus. No conjunctival pallor. PERRL. EOMI.  No nystagmus. ENT   Head:   Normocephalic and atraumatic.   Nose:   No congestion/rhinnorhea. No septal hematoma   Mouth/Throat:   MMM, no pharyngeal erythema. No peritonsillar mass.    Neck:   No stridor. No SubQ emphysema. No  meningismus. Hematological/Lymphatic/Immunilogical:   No cervical lymphadenopathy. Cardiovascular:   RRR. Symmetric bilateral radial and DP pulses.  No murmurs.  Respiratory:   Normal respiratory effort without tachypnea nor retractions. Breath sounds are clear and equal bilaterally. No wheezes/rales/rhonchi. Gastrointestinal:   Soft with generalized tenderness that refers to the right lower quadrant. Severe tenderness in the right lower quadrant.. Non distended. There is no CVA tenderness.  No rebound, rigidity, or guarding. Genitourinary:   deferred Musculoskeletal:   Nontender with normal range of motion in all extremities. No joint effusions.  No lower extremity tenderness.  No edema. Neurologic:   Normal speech and language.  CN 2-10 normal. Motor grossly intact. No gross focal neurologic deficits are appreciated.  Skin:    Skin is warm, dry and intact. No rash noted.  No petechiae, purpura, or bullae.  ____________________________________________    LABS (pertinent positives/negatives) (all labs ordered are listed, but only abnormal results are displayed) Labs Reviewed  COMPREHENSIVE METABOLIC PANEL - Abnormal; Notable for the following:    Glucose, Bld 120 (*)    All other components within normal limits  CBC - Abnormal; Notable for the following:    WBC 16.0 (*)    All other components within normal limits  URINALYSIS COMPLETEWITH MICROSCOPIC ([facility] ONLY) - Abnormal; Notable for the following:    Color, Urine YELLOW (*)    APPearance HAZY (*)    Ketones, ur 2+ (*)    Squamous Epithelial / LPF 6-30 (*)    All other components within normal limits  LIPASE, BLOOD  POC URINE PREG, ED  POCT PREGNANCY, URINE   ____________________________________________   EKG    ____________________________________________    RADIOLOGY  Ultrasound pelvis reveals hemorrhagic right ovarian cyst. No evidence of  torsion.  ____________________________________________   PROCEDURES   ____________________________________________   INITIAL IMPRESSION / ASSESSMENT AND PLAN / ED COURSE  Pertinent labs & imaging results that were available during my care of the patient were reviewed by me and considered in my medical decision making (see chart for details).  Patient presents with right lower quadrant abdominal pain. Concern for ovarian cyst versus torsion versus appendicitis versus kidney stone. Workup algorithm for this discussed with the patient. Initial labs are significant only for leukocytosis of 16,000. Not pregnant. No UTI.  We will proceed with ultrasound pelvis with duplex to evaluate for torsion versus cyst. If negative, we will need to proceed with a CT scan of the abdomen and pelvis to evaluate for appendicitis.    ----------------------------------------- 3:58 PM on 02/11/2016 -----------------------------------------  Patient well appearing, vital signs stable. Pain  controlled. Tolerating oral intake. Ultrasound reveals right ovarian cyst, hemorrhagic. This is a recurring issue for her. Does not appear to be any significant blood loss and her vital signs are normal. On discharge home with follow-up with gynecology. Pain control for now.   ____________________________________________   FINAL CLINICAL IMPRESSION(S) / ED DIAGNOSES  Final diagnoses:  RLQ abdominal pain  Hemorrhagic ovarian cyst       Portions of this note were generated with dragon dictation software. Dictation errors may occur despite best attempts at proofreading.   Sharman CheekPhillip Cotton Beckley, MD 02/11/16 [phone]

## 2016-02-11 NOTE — Discharge Instructions (Signed)
You were prescribed a medication that is potentially sedating. Do not drink alcohol, drive or participate in any other potentially dangerous activities while taking this medication as it may make you sleepy. Do not take this medication with any other sedating medications, either prescription or over-the-counter. If you were prescribed Percocet or Vicodin, do not take these with acetaminophen (Tylenol) as it is already contained within these medications. °  °Opioid pain medications (or "narcotics") can be habit forming.  Use it as little as possible to achieve adequate pain control.  Do not use or use it with extreme caution if you have a history of opiate abuse or dependence.  If you are on a pain contract with your primary care doctor or a pain specialist, be sure to let them know you were prescribed this medication today from the [facility].  This medication is intended for your use only - do not give any to anyone else and keep it in a secure place where nobody else, especially children and pets, have access to it.  It will also cause or worsen constipation, so you may want to consider taking an over-the-counter stool softener while you are taking this medication. ° °Abdominal Pain, Adult °Many things can cause abdominal pain. Usually, abdominal pain is not caused by a disease and will improve without treatment. It can often be observed and treated at home. Your health care provider will do a physical exam and possibly order blood tests and X-rays to help determine the seriousness of your pain. However, in many cases, more time must pass before a clear cause of the pain can be found. Before that point, your health care provider may not know if you need more testing or further treatment. °HOME CARE INSTRUCTIONS °Monitor your abdominal pain for any changes. The following actions may help to alleviate any discomfort you are experiencing: °· Only take over-the-counter or prescription  medicines as directed by your health care provider. °· Do not take laxatives unless directed to do so by your health care provider. °· Try a clear liquid diet (broth, tea, or water) as directed by your health care provider. Slowly move to a bland diet as tolerated. °SEEK MEDICAL CARE IF: °· You have unexplained abdominal pain. °· You have abdominal pain associated with nausea or diarrhea. °· You have pain when you urinate or have a bowel movement. °· You experience abdominal pain that wakes you in the night. °· You have abdominal pain that is worsened or improved by eating food. °· You have abdominal pain that is worsened with eating fatty foods. °· You have a fever. °SEEK IMMEDIATE MEDICAL CARE IF: °· Your pain does not go away within 2 hours. °· You keep throwing up (vomiting). °· Your pain is felt only in portions of the abdomen, such as the right side or the left lower portion of the abdomen. °· You pass bloody or black tarry stools. °MAKE SURE YOU: °· Understand these instructions. °· Will watch your condition. °· Will get help right away if you are not doing well or get worse. °  °This information is not intended to replace advice given to you by your health care provider. Make sure you discuss any questions you have with your health care provider. °  °Document Released: 05/03/2005 Document Revised: 04/14/2015 Document Reviewed: 04/02/2013 °Elsevier Interactive Patient Education ©2016 [employer] ° °

## 2016-02-11 NOTE — ED Notes (Addendum)
Lab results reviewed. Awaiting bed for MD eval. WBC 16.0.

## 2016-02-11 NOTE — ED Notes (Signed)
Pt discharge pending due to med hold. Pt drove self to ED and does not have a ride.

## 2016-03-20 ENCOUNTER — Emergency Department
Admission: EM | Admit: 2016-03-20 | Discharge: 2016-03-20 | Disposition: A | Discharge status: Home / Self Care | Payer: Medicaid Other | Attending: Emergency Medicine | Admitting: Emergency Medicine

## 2016-03-20 ENCOUNTER — Encounter: Payer: Self-pay | Admitting: Emergency Medicine

## 2016-03-20 DIAGNOSIS — N939 Abnormal uterine and vaginal bleeding, unspecified: Secondary | ICD-10-CM

## 2016-03-20 DIAGNOSIS — B9689 Other specified bacterial agents as the cause of diseases classified elsewhere: Secondary | ICD-10-CM

## 2016-03-20 DIAGNOSIS — F17210 Nicotine dependence, cigarettes, uncomplicated: Secondary | ICD-10-CM | POA: Insufficient documentation

## 2016-03-20 DIAGNOSIS — N760 Acute vaginitis: Secondary | ICD-10-CM

## 2016-03-20 LAB — COMPREHENSIVE METABOLIC PANEL
ALT: 18 U/L (ref 14–54)
AST: 21 U/L (ref 15–41)
Albumin: 4.4 g/dL (ref 3.5–5.0)
Alkaline Phosphatase: 48 U/L (ref 38–126)
Anion gap: 7 (ref 5–15)
BUN: 15 mg/dL (ref 6–20)
CALCIUM: 9.3 mg/dL (ref 8.9–10.3)
CHLORIDE: 104 mmol/L (ref 101–111)
CO2: 27 mmol/L (ref 22–32)
CREATININE: 0.76 mg/dL (ref 0.44–1.00)
Glucose, Bld: 68 mg/dL (ref 65–99)
Potassium: 4.2 mmol/L (ref 3.5–5.1)
Sodium: 138 mmol/L (ref 135–145)
Total Bilirubin: 0.5 mg/dL (ref 0.3–1.2)
Total Protein: 7.2 g/dL (ref 6.5–8.1)

## 2016-03-20 LAB — CBC
HCT: 40.9 % (ref 35.0–47.0)
Hemoglobin: 14.3 g/dL (ref 12.0–16.0)
MCH: 30.8 pg (ref 26.0–34.0)
MCHC: 34.9 g/dL (ref 32.0–36.0)
MCV: 88.3 fL (ref 80.0–100.0)
PLATELETS: 246 10*3/uL (ref 150–440)
RBC: 4.63 MIL/uL (ref 3.80–5.20)
RDW: 12.9 % (ref 11.5–14.5)
WBC: 8 10*3/uL (ref 3.6–11.0)

## 2016-03-20 LAB — URINALYSIS COMPLETE WITH MICROSCOPIC (ARMC ONLY)
Bacteria, UA: NONE SEEN
Bilirubin Urine: NEGATIVE
Glucose, UA: NEGATIVE mg/dL
KETONES UR: NEGATIVE mg/dL
LEUKOCYTES UA: NEGATIVE
NITRITE: NEGATIVE
PH: 6 (ref 5.0–8.0)
PROTEIN: NEGATIVE mg/dL
SPECIFIC GRAVITY, URINE: 1.027 (ref 1.005–1.030)
WBC, UA: NONE SEEN WBC/hpf (ref 0–5)

## 2016-03-20 LAB — POCT PREGNANCY, URINE: Preg Test, Ur: NEGATIVE

## 2016-03-20 LAB — CHLAMYDIA/NGC RT PCR (ARMC ONLY)
Chlamydia Tr: NOT DETECTED
N gonorrhoeae: NOT DETECTED

## 2016-03-20 LAB — WET PREP, GENITAL
Sperm: NONE SEEN
Trich, Wet Prep: NONE SEEN
Yeast Wet Prep HPF POC: NONE SEEN

## 2016-03-20 LAB — LIPASE, BLOOD: LIPASE: 41 U/L (ref 11–51)

## 2016-03-20 MED ORDER — MEDROXYPROGESTERONE ACETATE 10 MG PO TABS: 10.0000 mg | ORAL_TABLET | Freq: Every day | ORAL | 0 refills | Status: DC

## 2016-03-20 MED ORDER — METRONIDAZOLE 500 MG PO TABS: 500.0000 mg | ORAL_TABLET | Freq: Two times a day (BID) | ORAL | 0 refills | Status: AC

## 2016-03-20 NOTE — ED Notes (Signed)
Pt discharged to home.  Family member driving.  Discharge instructions reviewed.  Verbalized understanding.  No questions or concerns at this time.  Teach back verified.  Pt in NAD.  No items left in ED.   

## 2016-03-20 NOTE — Discharge Instructions (Signed)
Please contact the OB/GYN. Return to emergency department of vaginal bleeding of more than 1 full pad per hour for 4 consecutive hours. Drink plenty of fluids. He may also want to take an iron supplements.   Please return immediately if condition worsens. Please contact her primary physician or the physician you were given for referral. If you have any specialist physicians involved in her treatment and plan please also contact them. Thank you for using [facility].

## 2016-03-20 NOTE — ED Provider Notes (Signed)
Time Seen: Approximately *2131 I have reviewed the triage notes  Chief Complaint: Vaginal Bleeding   History of Present Illness: Alexa Guerra is a 25 y.o. female who was recently evaluated here in emergency department and was diagnosed with an ovarian cyst on the right side last month. Patient failed to follow up with an OB/GYN referral. The patient denies any fever though she does develop some irregular vaginal bleeding over the last several days. She states the bleeding seemed to be spotty in nature and then seemed to be somewhat more heavy earlier today. She is also noticed a vaginal discharge associated with it is concerned about sexually transmitted diseases. Patient states that normally she has very irregular menstrual periods Past Medical History:  Diagnosis Date  . Double aortic arch   . Double aortic arch   . Hx of varicella   . Ovarian cyst     There are no active problems to display for this patient.   Past Surgical History:  Procedure Laterality Date  . NO PAST SURGERIES      Past Surgical History:  Procedure Laterality Date  . NO PAST SURGERIES      Current Outpatient Rx  . Order #: [id number] Class: Print  . Order #: [id number] Class: Print  . Order #: [id number] Class: Print  . Order #: [id number] Class: Print  . Order #: [id number] Class: Print    Allergies:  Beta adrenergic blockers  Family History: Family History  Problem Relation Age of Onset  . Drug abuse Mother   . Cancer Mother     cervix  . Alcohol abuse Father   . Hypertension Maternal Grandmother   . Diabetes Maternal Grandmother   . Hypertension Maternal Grandfather   . Diabetes Maternal Grandfather   . Hypertension Paternal Grandmother   . Hypertension Paternal Grandfather     Social History: Social History  Substance Use Topics  . Smoking status: Current Every Day Smoker    Packs/day: 0.25    Types: Cigarettes  . Smokeless tobacco: Never Used  . Alcohol use Yes     Comment:  Occasionally     Review of Systems:   10 point review of systems was performed and was otherwise negative:  Constitutional: No fever Eyes: No visual disturbances ENT: No sore throat, ear pain Cardiac: No chest pain Respiratory: No shortness of breath, wheezing, or stridor Abdomen: No abdominal pain, no vomiting, No diarrhea Endocrine: No weight loss, No night sweats Extremities: No peripheral edema, cyanosis Skin: No rashes, easy bruising Neurologic: No focal weakness, trouble with speech or swollowing Urologic: No dysuria, Hematuria, or urinary frequency   Physical Exam:  ED Triage Vitals  Enc Vitals Group     BP 03/20/16 1952 122/73     Pulse Rate 03/20/16 1952 92     Resp 03/20/16 1952 18     Temp 03/20/16 1952 98.2 F (36.8 C)     Temp Source 03/20/16 1952 Oral     SpO2 03/20/16 1952 100 %     Weight 03/20/16 1952 126 lb 3.2 oz (57.2 kg)     Height 03/20/16 1952 5\' 3"  (1.6 m)     Head Circumference --      Peak Flow --      Pain Score 03/20/16 1953 8     Pain Loc --      Pain Edu? --      Excl. in GC? --     General: Awake , Alert , and Oriented times 3; GCS 15 Head:  Normal cephalic , atraumatic Eyes: Pupils equal , round, reactive to light Nose/Throat: No nasal drainage, patent upper airway without erythema or exudate.  Neck: Supple, Full range of motion, No anterior adenopathy or palpable thyroid masses Lungs: Clear to ascultation without wheezes , rhonchi, or rales Heart: Regular rate, regular rhythm without murmurs , gallops , or rubs Abdomen: Soft, non tender without rebound, guarding , or rigidity; bowel sounds positive and symmetric in all 4 quadrants. No organomegaly .        Extremities: 2 plus symmetric pulses. No edema, clubbing or cyanosis Neurologic: normal ambulation, Motor symmetric without deficits, sensory intact Skin: warm, dry, no rashes Pelvic exam with chaperone present shows no significant vaginal discharge or bleeding. No obvious  cervical motion tenderness or lesions cited on the cervix.  Labs:   All laboratory work was reviewed including any pertinent negatives or positives listed below:  Labs Reviewed  WET PREP, GENITAL - Abnormal; Notable for the following:       Result Value   Clue Cells Wet Prep HPF POC PRESENT (*)    WBC, Wet Prep HPF POC FEW (*)    All other components within normal limits  URINALYSIS COMPLETEWITH MICROSCOPIC ([facility] ONLY) - Abnormal; Notable for the following:    Color, Urine YELLOW (*)    APPearance CLEAR (*)    Hgb urine dipstick 2+ (*)    Squamous Epithelial / LPF 0-5 (*)    All other components within normal limits  CHLAMYDIA/NGC RT PCR ([facility] ONLY)  LIPASE, BLOOD  COMPREHENSIVE METABOLIC PANEL  CBC  POC URINE PREG, ED  POCT PREGNANCY, URINE  Laboratory work was reviewed and showed no clinically significant abnormalities. Clue cells seen on wet prep testing  ED Course: Patient's stay here was uneventful and the patient's otherwise stable and I felt did not have any reasons for admission at this time. Her hemoglobin is stable and she seemed to be somewhat reluctant for contraceptive therapy and I elected to try medroxyprogesterone for her irregular vaginal bleeding. She does have some clue cells and describes vaginal discharge with her bleeding and I felt she would benefit with a prescription for Flagyl. She was advised that the gonorrhea and chlamydia samples are pending at this time, but I did not see any significant discharge in line with those pathologies Patient was referred to Dr. Tiburcio PeaHarris on call for OB/GYN unassigned Clinical Course     Assessment: Dysfunctional vaginal bleeding Gardnerella vaginitis   Final Clinical Impression:   Final diagnoses:  Abnormal vaginal bleeding  Gardnerella vaginitis     Plan:  Outpatient " Discharge Medication List as of 03/20/2016 10:35 PM    START taking these medications   Details  medroxyPROGESTERone (PROVERA) 10  MG tablet Take 1 tablet (10 mg total) by mouth daily., Starting Mon 03/20/2016, Until Tue 03/20/2017, Print    metroNIDAZOLE (FLAGYL) 500 MG tablet Take 1 tablet (500 mg total) by mouth 2 (two) times daily., Starting Mon 03/20/2016, Until Mon 03/27/2016, Print      " Patient was advised to return immediately if condition worsens. Patient was advised to follow up with their primary care physician or other specialized physicians involved in their outpatient care. The patient and/or family member/power of attorney had laboratory results reviewed at the bedside. All questions and concerns were addressed and appropriate discharge instructions were distributed by the nursing staff.             Jennye MoccasinBrian S Keirsten Matuska, MD 03/20/16 [phone]

## 2016-03-20 NOTE — ED Triage Notes (Signed)
Pt presents to ED with steady gait with c/o intermittent vaginal bleeding, reports was dx ovarian cyst last month. Pt reports today has been having heavy bleeding with blood clots and vaginal discharge. Pt reports taking ibuprofen without relief. Pt denies dysuria, chest pain, shortness of breath. Pt reports lower abdominal tenderness. Pt alert and oriented x 4, no increased work in breathing.

## 2016-07-06 ENCOUNTER — Ambulatory Visit (HOSPITAL_COMMUNITY)
Admission: EM | Admit: 2016-07-06 | Discharge: 2016-07-06 | Disposition: A | Discharge status: Home / Self Care | Payer: Medicaid Other | Attending: Family Medicine | Admitting: Family Medicine

## 2016-07-06 ENCOUNTER — Encounter (HOSPITAL_COMMUNITY): Payer: Self-pay | Admitting: Emergency Medicine

## 2016-07-06 DIAGNOSIS — J40 Bronchitis, not specified as acute or chronic: Secondary | ICD-10-CM | POA: Diagnosis not present

## 2016-07-06 MED ORDER — AZITHROMYCIN 250 MG PO TABS: 250.0000 mg | ORAL_TABLET | Freq: Every day | ORAL | 0 refills | Status: DC

## 2016-07-06 MED ORDER — HYDROCODONE-HOMATROPINE 5-1.5 MG/5ML PO SYRP: 5.0000 mL | ORAL_SOLUTION | Freq: Four times a day (QID) | ORAL | 0 refills | Status: DC | PRN

## 2016-07-06 MED ORDER — ALBUTEROL SULFATE HFA 108 (90 BASE) MCG/ACT IN AERS: 2.0000 | INHALATION_SPRAY | RESPIRATORY_TRACT | 1 refills | Status: DC | PRN

## 2016-07-06 NOTE — Discharge Instructions (Signed)
SMOKING CESSATION HELP:  Don?t use tobacco products: Call [phone] for an appointment to help you quit using tobacco  You may call 1-800-quit-now

## 2016-07-06 NOTE — ED Provider Notes (Signed)
[facility]    CSN: [id number] Arrival date & time: 07/06/16  1009     History   Chief Complaint Chief Complaint  Patient presents with  . Cough    HPI Alexa Guerra is a 25 y.o. female.   This is a 25 year old woman who has one week of cough. The cough is described as productive. Patient is a smoker. She would like to quit smoking at this point. She is smoking less than a pack a day. She had quit for a while but then 2 years ago started back up again.  Patient's had some sweats at night and chills as well. She does get a short of breath when she walks 1 mile each way to work. She has not had asthma. She does have some deep chest ache.  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  Patient [place] at [employer] [place]      Past Medical History:  Diagnosis Date  . Double aortic arch   . Double aortic arch   . Hx of varicella   . Ovarian cyst     There are no active problems to display for this patient.   Past Surgical History:  Procedure Laterality Date  . NO PAST SURGERIES      OB History    Gravida Para Term Preterm AB Living   2 1 1   1 1    SAB TAB Ectopic Multiple Live Births   1       1       Home Medications    Prior to Admission medications   Medication Sig Start Date End Date Taking? Authorizing Provider  OVER THE COUNTER MEDICATION [facility] brand cold medicine and store brand night time medicine   Yes Historical Provider, MD  albuterol (PROVENTIL HFA;VENTOLIN HFA) 108 (90 Base) MCG/ACT inhaler Inhale 2 puffs into the lungs every 4 (four) hours as needed for wheezing or shortness of breath (cough, shortness of breath or wheezing.). 07/06/16   Elvina SidleKurt Freddie Dymek, MD  azithromycin (ZITHROMAX) 250 MG tablet Take 1 tablet (250 mg total) by mouth daily. Take first 2 tablets together, then 1 every day until finished. 07/06/16   Elvina SidleKurt Shaylee Stanislawski, MD  HYDROcodone-homatropine [facility](HYCODAN) 5-1.5 MG/5ML syrup Take 5 mLs by mouth every 6 (six)  hours as needed for cough. 07/06/16   Elvina SidleKurt Bryony Kaman, MD    Family History Family History  Problem Relation Age of Onset  . Drug abuse Mother   . Cancer Mother     cervix  . Alcohol abuse Father   . Hypertension Maternal Grandmother   . Diabetes Maternal Grandmother   . Hypertension Maternal Grandfather   . Diabetes Maternal Grandfather   . Hypertension Paternal Grandmother   . Hypertension Paternal Grandfather     Social History Social History  Substance Use Topics  . Smoking status: Current Every Day Smoker    Packs/day: 0.25    Types: Cigarettes  . Smokeless tobacco: Never Used  . Alcohol use Yes     Comment: Occasionally     Allergies   Beta adrenergic blockers   Review of Systems Review of Systems  Constitutional: Positive for chills and diaphoresis.  HENT: Positive for congestion.   Respiratory: Positive for cough and chest tightness.   Cardiovascular: Positive for chest pain.  Gastrointestinal: Negative.   Musculoskeletal: Negative.   Neurological: Negative.      Physical Exam Triage Vital Signs ED Triage Vitals  Enc Vitals Group  BP 07/06/16 1058 105/62     Pulse Rate 07/06/16 1058 78     Resp 07/06/16 1058 18     Temp 07/06/16 1058 97.6 F (36.4 C)     Temp Source 07/06/16 1058 Oral     SpO2 07/06/16 1058 98 %     Weight --      Height --      Head Circumference --      Peak Flow --      Pain Score 07/06/16 1056 6     Pain Loc --      Pain Edu? --      Excl. in GC? --    No data found.   Updated Vital Signs BP 105/62 (BP Location: Right Arm)   Pulse 78   Temp 97.6 F (36.4 C) (Oral)   Resp 18   LMP 06/09/2016   SpO2 98%    Physical Exam  Constitutional: She is oriented to person, place, and time. She appears well-developed and well-nourished.  HENT:  Head: Normocephalic.  Right Ear: External ear normal.  Left Ear: External ear normal.  Mouth/Throat: Oropharynx is clear and moist.  Eyes: Conjunctivae and EOM are normal.  Pupils are equal, round, and reactive to light.  Neck: Normal range of motion. Neck supple.  Cardiovascular: Normal rate, regular rhythm and normal heart sounds.   Pulmonary/Chest: Effort normal and breath sounds normal.  Musculoskeletal: Normal range of motion.  Neurological: She is alert and oriented to person, place, and time.  Skin: Skin is warm and dry.  Nursing note and vitals reviewed.    UC Treatments / Results  Labs (all labs ordered are listed, but only abnormal results are displayed) Labs Reviewed - No data to display  EKG  EKG Interpretation None       Radiology No results found.  Procedures Procedures (including critical care time)  Medications Ordered in UC Medications - No data to display   Initial Impression / Assessment and Plan / UC Course  I have reviewed the triage vital signs and the nursing notes.  Pertinent labs & imaging results that were available during my care of the patient were reviewed by me and considered in my medical decision making (see chart for details).  Clinical Course       Final Clinical Impressions(s) / UC Diagnoses   Final diagnoses:  Bronchitis    New Prescriptions New Prescriptions   ALBUTEROL (PROVENTIL HFA;VENTOLIN HFA) 108 (90 BASE) MCG/ACT INHALER    Inhale 2 puffs into the lungs every 4 (four) hours as needed for wheezing or shortness of breath (cough, shortness of breath or wheezing.).   AZITHROMYCIN (ZITHROMAX) 250 MG TABLET    Take 1 tablet (250 mg total) by mouth daily. Take first 2 tablets together, then 1 every day until finished.   HYDROCODONE-HOMATROPINE (HYCODAN) 5-1.5 MG/5ML SYRUP    Take 5 mLs by mouth every 6 (six) hours as needed for cough.     Elvina SidleKurt Addilyn Satterwhite, MD 07/06/16 [phone]

## 2016-07-06 NOTE — ED Triage Notes (Addendum)
Symptoms started a week ago of coughing, blowing green secretions from nose.  unknown fever. Patient has cough, minimal sore throat, not so much pain but is a tickle in throat, ears are uncomfortable.  Complains of wheezing and simply feeling bad in general.  Chest hurts with coughing.  Patient walks a mile to work and cold are is making this more difficult.  Patient is a smoker

## 2017-05-02 ENCOUNTER — Encounter (HOSPITAL_COMMUNITY): Payer: Self-pay | Admitting: *Deleted

## 2017-05-02 ENCOUNTER — Ambulatory Visit (HOSPITAL_COMMUNITY)
Admission: EM | Admit: 2017-05-02 | Discharge: 2017-05-02 | Disposition: A | Discharge status: Home / Self Care | Payer: Medicaid Other | Attending: Family Medicine | Admitting: Family Medicine

## 2017-05-02 DIAGNOSIS — B9789 Other viral agents as the cause of diseases classified elsewhere: Secondary | ICD-10-CM | POA: Diagnosis not present

## 2017-05-02 DIAGNOSIS — J069 Acute upper respiratory infection, unspecified: Secondary | ICD-10-CM | POA: Diagnosis not present

## 2017-05-02 MED ORDER — HYDROCODONE-HOMATROPINE 5-1.5 MG/5ML PO SYRP: 5.0000 mL | ORAL_SOLUTION | Freq: Four times a day (QID) | ORAL | 0 refills | Status: DC | PRN

## 2017-05-02 NOTE — ED Triage Notes (Signed)
Cough  /   Congestion    / chest  Pain  Started   2  Days  Ago         Itchy   Throat       Sinus  Drainage

## 2017-05-02 NOTE — ED Provider Notes (Signed)
  [facility]   [id number] 05/02/17 Arrival Time: 1522  ASSESSMENT & PLAN:  1. Viral URI with cough     Meds ordered this encounter  Medications  . HYDROcodone-homatropine (HYCODAN) 5-1.5 MG/5ML syrup    Sig: Take 5 mLs by mouth every 6 (six) hours as needed for cough.    Dispense:  90 mL    Refill:  0   Medication sedation precautions. Work note given. OTC analgesics and symptom care as needed.  Reviewed expectations re: course of current medical issues. Questions answered. Outlined signs and symptoms indicating need for more acute intervention. Patient verbalized understanding. After Visit Summary given.   SUBJECTIVE:  Alexa Guerra is a 26 y.o. female who presents with complaint of nasal congestion, post-nasal drainage, and a persistent cough. Abrupt onset approximately 2 days ago. Fatigue and body aches. Post-tussive nausea. Questions subjective fever at night. No SOB or wheezing. Smokes cigarettes. Overall decreased appetite. OTC treatment: none.   ROS: As per HPI.   OBJECTIVE:  Vitals:   05/02/17 1553  BP: 112/70  Pulse: 78  Resp: 18  Temp: 98.1 F (36.7 C)  TempSrc: Oral  SpO2: 100%     General appearance: alert; no distress HEENT: nasal congestion; clear runny nose; throat irritation secondary to post-nasal drainage Neck: supple without LAD Lungs: clear to auscultation bilaterally; active cough Skin: warm and dry Psychological: alert and cooperative; normal mood and affect  No results found.  Allergies  Allergen Reactions  . Beta Adrenergic Blockers Other (See Comments)    Vitals dropped "I was pretty much dying"    Past Medical History:  Diagnosis Date  . Double aortic arch   . Double aortic arch   . Hx of varicella   . Ovarian cyst    Social History   Social History  . Marital status: Single    Spouse name: N/A  . Number of children: N/A  . Years of education: N/A   Occupational History  . Not on file.   Social  History Main Topics  . Smoking status: Current Every Day Smoker    Packs/day: 0.25    Types: Cigarettes  . Smokeless tobacco: Never Used  . Alcohol use Yes     Comment: Occasionally  . Drug use: No  . Sexual activity: No   Other Topics Concern  . Not on file   Social History Narrative  . No narrative on file   Family History  Problem Relation Age of Onset  . Drug abuse Mother   . Cancer Mother        cervix  . Alcohol abuse Father   . Hypertension Maternal Grandmother   . Diabetes Maternal Grandmother   . Hypertension Maternal Grandfather   . Diabetes Maternal Grandfather   . Hypertension Paternal Grandmother   . Hypertension Paternal Glynda Jaeger, MD 05/02/17 [phone]

## 2017-05-07 ENCOUNTER — Encounter: Payer: Self-pay | Admitting: Emergency Medicine

## 2017-05-07 ENCOUNTER — Emergency Department
Admission: EM | Admit: 2017-05-07 | Discharge: 2017-05-07 | Disposition: A | Discharge status: Home / Self Care | Payer: Medicaid Other | Attending: Emergency Medicine | Admitting: Emergency Medicine

## 2017-05-07 ENCOUNTER — Emergency Department: Payer: Medicaid Other

## 2017-05-07 DIAGNOSIS — R51 Headache: Secondary | ICD-10-CM | POA: Insufficient documentation

## 2017-05-07 DIAGNOSIS — R509 Fever, unspecified: Secondary | ICD-10-CM | POA: Insufficient documentation

## 2017-05-07 DIAGNOSIS — F17210 Nicotine dependence, cigarettes, uncomplicated: Secondary | ICD-10-CM | POA: Insufficient documentation

## 2017-05-07 DIAGNOSIS — R05 Cough: Secondary | ICD-10-CM | POA: Insufficient documentation

## 2017-05-07 DIAGNOSIS — R6889 Other general symptoms and signs: Secondary | ICD-10-CM

## 2017-05-07 LAB — INFLUENZA PANEL BY PCR (TYPE A & B)
Influenza A By PCR: NEGATIVE
Influenza B By PCR: NEGATIVE

## 2017-05-07 MED ORDER — BENZONATATE 100 MG PO CAPS: 100.0000 mg | ORAL_CAPSULE | Freq: Three times a day (TID) | ORAL | 0 refills | Status: DC | PRN

## 2017-05-07 MED ORDER — IBUPROFEN 600 MG PO TABS: 600.0000 mg | ORAL_TABLET | Freq: Three times a day (TID) | ORAL | 0 refills | Status: DC | PRN

## 2017-05-07 MED ORDER — FEXOFENADINE-PSEUDOEPHED ER 60-120 MG PO TB12: 1.0000 | ORAL_TABLET | Freq: Two times a day (BID) | ORAL | 0 refills | Status: DC

## 2017-05-07 NOTE — ED Triage Notes (Signed)
Seen through Urgent Care last week and diagnosed with a virus and given cough syrup.  Presents today with continued sinus congestion and body aches.

## 2017-05-07 NOTE — ED Notes (Signed)
See triage note   States she developed some cough and body aches since last week  Was seen and dx'd with virus and now feels worse

## 2017-05-07 NOTE — ED Provider Notes (Signed)
[facility] Emergency Department Provider Note   ____________________________________________   First MD Initiated Contact with Patient 05/07/17 1447     (approximate)  I have reviewed the triage vital signs and the nursing notes.   HISTORY  Chief Complaint URI    HPI Alexa Guerra is a 26 y.o. female  Patient complaining of 1 week of sinus congestion, cough, fever/chills,and body aches. Patient seen at urgent care clinic 1 week ago and diagnosed with viral respiratory infection. Patient given a codeine based cough syrupto take as needed for cough. Patient stated no improvement or worsening of her complaint since the urgent care visit. Patient state there is nausea and 2 episodes of vomiting today. Patient denies diarrhea. Patient rates her pain discomfort as 8/10. Patient prescription for pain as "achy". No other palliative measures for complaint.   Past Medical History:  Diagnosis Date  . Double aortic arch   . Double aortic arch   . Hx of varicella   . Ovarian cyst     There are no active problems to display for this patient.   Past Surgical History:  Procedure Laterality Date  . NO PAST SURGERIES      Prior to Admission medications   Medication Sig Start Date End Date Taking? Authorizing Provider  benzonatate (TESSALON PERLES) 100 MG capsule Take 1 capsule (100 mg total) by mouth 3 (three) times daily as needed for cough. 05/07/17   Joni Reining, PA-C  fexofenadine-pseudoephedrine (ALLEGRA-D) 60-120 MG 12 hr tablet Take 1 tablet by mouth 2 (two) times daily. 05/07/17   Joni Reining, PA-C  HYDROcodone-homatropine [facility]) 5-1.5 MG/5ML syrup Take 5 mLs by mouth every 6 (six) hours as needed for cough. 05/02/17   Mardella Layman, MD  ibuprofen (ADVIL,MOTRIN) 600 MG tablet Take 1 tablet (600 mg total) by mouth every 8 (eight) hours as needed. 05/07/17   Joni Reining, PA-C    Allergies Beta adrenergic blockers  Family History    Problem Relation Age of Onset  . Drug abuse Mother   . Cancer Mother        cervix  . Alcohol abuse Father   . Hypertension Maternal Grandmother   . Diabetes Maternal Grandmother   . Hypertension Maternal Grandfather   . Diabetes Maternal Grandfather   . Hypertension Paternal Grandmother   . Hypertension Paternal Grandfather     Social History Social History  Substance Use Topics  . Smoking status: Current Every Day Smoker    Packs/day: 0.25    Types: Cigarettes  . Smokeless tobacco: Never Used  . Alcohol use Yes     Comment: Occasionally    Review of Systems  Constitutional:  Fever/chills and body aches Eyes: No visual changes. ENT:  Sore throat. Cardiovascular: Denies chest pain. Respiratory: Denies shortness of breath.nonproductive cough Gastrointestinal: No abdominal pain.   Nausea vomiting.  No diarrhea.  No constipation. Genitourinary: Negative for dysuria. Musculoskeletal: Negative for back pain. Skin: Negative for rash. Neurological:  Positive for frontal headaches,  But denies focal weakness or numbness. Allergic/Immunilogical: beta blockers ____________________________________________   PHYSICAL EXAM:  VITAL SIGNS: ED Triage Vitals  Enc Vitals Group     BP 05/07/17 1434 113/72     Pulse Rate 05/07/17 1434 (!) 111     Resp 05/07/17 1434 16     Temp 05/07/17 1434 98.4 F (36.9 C)     Temp Source 05/07/17 1434 Oral     SpO2 05/07/17 1434 97 %  Weight 05/07/17 1433 120 lb (54.4 kg)     Height 05/07/17 1433  (1.626 m)     Head Circumference --      Peak Flow --      Pain Score 05/07/17 1432 8     Pain Loc --      Pain Edu? --      Excl. in GC? --     Constitutional: Alert and oriented. Well appearing and in no acute distress. Nose:  Bilateral maxillary guarding with tenderness nasal turbinates.  And rhinorrhea Mouth/Throat: Mucous membranes are moist.  Oropharynx non-erythematous. Neck: No stridor. Hematological/Lymphatic/Immunilogical:  No cervical lymphadenopathy. Cardiovascular: Normal rate, regular rhythm. Grossly normal heart sounds.  Good peripheral circulation. Respiratory: Normal respiratory effort.  No retractions. Lungs CTAB. Gastrointestinal: Soft and nontender. No distention. No abdominal bruits. No CVA tenderness. Neurologic:  Normal speech and language. No gross focal neurologic deficits are appreciated. No gait instability. Skin:  Skin is warm, dry and intact. No rash noted. Psychiatric: Mood and affect are normal. Speech and behavior are normal.  ____________________________________________   LABS (all labs ordered are listed, but only abnormal results are displayed)  Labs Reviewed  INFLUENZA PANEL BY PCR (TYPE A & B)   ____________________________________________  EKG   ____________________________________________  RADIOLOGY  Dg Chest 2 View  Result Date: 05/07/2017 CLINICAL DATA:  Chest congestion, fever, body aches EXAM: CHEST  2 VIEW COMPARISON:  CT chest 08/23/2007 FINDINGS: The heart size and mediastinal contours are within normal limits. Both lungs are clear. The visualized skeletal structures are unremarkable. IMPRESSION: No active cardiopulmonary disease. Electronically Signed   By: Elige Ko   On: 05/07/2017 15:12    ____________________________________________   PROCEDURES  Procedure(s) performed: None  Procedures  Critical Care performed: No  ____________________________________________   INITIAL IMPRESSION / ASSESSMENT AND PLAN / ED COURSE  Pertinent labs & imaging results that were available during my care of the patient were reviewed by me and considered in my medical decision making (see chart for details). Viral illness  patient presents with one week of cough, congestion, and body aches.  Discussed with patient and negative flu test and negative x-ray of the chest. Discussed sequela viral illness with patient. Patient given discharge care instructions. Patient  given a prescription for fexofenadine/pseudoephedrine, Tessalon Perles, and ibuprofen. Patient given a work note. Patient advised to follow-up with,[facility] if condition persists.      ____________________________________________   FINAL CLINICAL IMPRESSION(S) / ED DIAGNOSES  Final diagnoses:  Flu-like symptoms      NEW MEDICATIONS STARTED DURING THIS VISIT:  New Prescriptions   BENZONATATE (TESSALON PERLES) 100 MG CAPSULE    Take 1 capsule (100 mg total) by mouth 3 (three) times daily as needed for cough.   FEXOFENADINE-PSEUDOEPHEDRINE (ALLEGRA-D) 60-120 MG 12 HR TABLET    Take 1 tablet by mouth 2 (two) times daily.   IBUPROFEN (ADVIL,MOTRIN) 600 MG TABLET    Take 1 tablet (600 mg total) by mouth every 8 (eight) hours as needed.     Note:  This document was prepared using Dragon voice recognition software and may include unintentional dictation errors.    Joni Reining, PA-C 05/07/17 1544    Nita Sickle, MD 05/07/17 [id number]

## 2017-08-07 DIAGNOSIS — O009 Unspecified ectopic pregnancy without intrauterine pregnancy: Secondary | ICD-10-CM

## 2017-08-07 HISTORY — DX: Unspecified ectopic pregnancy without intrauterine pregnancy: O00.90

## 2017-08-09 ENCOUNTER — Other Ambulatory Visit: Payer: Self-pay

## 2017-08-09 ENCOUNTER — Emergency Department: Payer: Medicaid Other

## 2017-08-09 ENCOUNTER — Emergency Department
Admission: EM | Admit: 2017-08-09 | Discharge: 2017-08-09 | Disposition: A | Discharge status: Home / Self Care | Payer: Medicaid Other | Attending: Emergency Medicine | Admitting: Emergency Medicine

## 2017-08-09 DIAGNOSIS — Z3A01 Less than 8 weeks gestation of pregnancy: Secondary | ICD-10-CM | POA: Insufficient documentation

## 2017-08-09 DIAGNOSIS — O200 Threatened abortion: Secondary | ICD-10-CM | POA: Diagnosis not present

## 2017-08-09 DIAGNOSIS — F1590 Other stimulant use, unspecified, uncomplicated: Secondary | ICD-10-CM | POA: Diagnosis not present

## 2017-08-09 DIAGNOSIS — O99321 Drug use complicating pregnancy, first trimester: Secondary | ICD-10-CM | POA: Diagnosis not present

## 2017-08-09 DIAGNOSIS — N838 Other noninflammatory disorders of ovary, fallopian tube and broad ligament: Secondary | ICD-10-CM | POA: Diagnosis not present

## 2017-08-09 DIAGNOSIS — O99331 Smoking (tobacco) complicating pregnancy, first trimester: Secondary | ICD-10-CM | POA: Insufficient documentation

## 2017-08-09 DIAGNOSIS — Z79899 Other long term (current) drug therapy: Secondary | ICD-10-CM | POA: Insufficient documentation

## 2017-08-09 DIAGNOSIS — O00202 Left ovarian pregnancy without intrauterine pregnancy: Secondary | ICD-10-CM

## 2017-08-09 DIAGNOSIS — R109 Unspecified abdominal pain: Secondary | ICD-10-CM | POA: Diagnosis not present

## 2017-08-09 DIAGNOSIS — F17210 Nicotine dependence, cigarettes, uncomplicated: Secondary | ICD-10-CM | POA: Insufficient documentation

## 2017-08-09 DIAGNOSIS — O209 Hemorrhage in early pregnancy, unspecified: Secondary | ICD-10-CM | POA: Diagnosis present

## 2017-08-09 LAB — CBC
HCT: 41.1 % (ref 35.0–47.0)
Hemoglobin: 14.1 g/dL (ref 12.0–16.0)
MCH: 30.6 pg (ref 26.0–34.0)
MCHC: 34.2 g/dL (ref 32.0–36.0)
MCV: 89.5 fL (ref 80.0–100.0)
PLATELETS: 237 10*3/uL (ref 150–440)
RBC: 4.59 MIL/uL (ref 3.80–5.20)
RDW: 13.3 % (ref 11.5–14.5)
WBC: 7.7 10*3/uL (ref 3.6–11.0)

## 2017-08-09 LAB — BASIC METABOLIC PANEL
ANION GAP: 6 (ref 5–15)
BUN: 9 mg/dL (ref 6–20)
CALCIUM: 8.8 mg/dL — AB (ref 8.9–10.3)
CO2: 25 mmol/L (ref 22–32)
Chloride: 104 mmol/L (ref 101–111)
Creatinine, Ser: 0.68 mg/dL (ref 0.44–1.00)
GFR calc Af Amer: >60 mL/min (ref >60)
GLUCOSE: 96 mg/dL (ref 65–99)
Potassium: 3.6 mmol/L (ref 3.5–5.1)
SODIUM: 135 mmol/L (ref 135–145)

## 2017-08-09 LAB — HCG, QUANTITATIVE, PREGNANCY: hCG, Beta Chain, Quant, S: 2593 m[IU]/mL — ABNORMAL HIGH (ref <5)

## 2017-08-09 LAB — HEPATIC FUNCTION PANEL
ALT: 16 U/L (ref 14–54)
AST: 20 U/L (ref 15–41)
Albumin: 4.3 g/dL (ref 3.5–5.0)
Alkaline Phosphatase: 47 U/L (ref 38–126)
BILIRUBIN DIRECT: 0.1 mg/dL (ref 0.1–0.5)
BILIRUBIN INDIRECT: 0.6 mg/dL (ref 0.3–0.9)
Total Bilirubin: 0.7 mg/dL (ref 0.3–1.2)
Total Protein: 6.8 g/dL (ref 6.5–8.1)

## 2017-08-09 LAB — POCT PREGNANCY, URINE: Preg Test, Ur: POSITIVE — AB

## 2017-08-09 LAB — TYPE AND SCREEN
ABO/RH(D): O NEG
Antibody Screen: NEGATIVE

## 2017-08-09 MED ORDER — RHO D IMMUNE GLOBULIN 1500 UNIT/2ML IJ SOSY
300.0000 ug | PREFILLED_SYRINGE | Freq: Once | INTRAMUSCULAR | Status: AC
  Administered 2017-08-09: 300 ug via INTRAMUSCULAR
  Filled 2017-08-09: qty 2

## 2017-08-09 NOTE — ED Provider Notes (Signed)
[facility] Emergency Department Provider Note  Time seen: 3:56 PM  I have reviewed the triage vital signs and the nursing notes.   HISTORY  Chief Complaint Vaginal Bleeding    HPI Alexa Guerra is a 27 y.o. female G3 P1 A1 who presents to the emergency department for vaginal bleeding.  According to the patient she is approximately [redacted] weeks pregnant based on last menstrual period.  Took multiple positive pregnancy tests at home.  Today she developed vaginal spotting and abdominal cramping.  States the cramping has begin to worsen over the afternoon.  Patient was concerned that she could possibly be having a miscarriage so she came to the emergency department for evaluation.  Patient otherwise denies any symptoms.  Largely negative review of systems.   Past Medical History:  Diagnosis Date  . Double aortic arch   . Double aortic arch   . Hx of varicella   . Ovarian cyst     There are no active problems to display for this patient.   Past Surgical History:  Procedure Laterality Date  . NO PAST SURGERIES      Prior to Admission medications   Medication Sig Start Date End Date Taking? Authorizing Provider  benzonatate (TESSALON PERLES) 100 MG capsule Take 1 capsule (100 mg total) by mouth 3 (three) times daily as needed for cough. 05/07/17   Joni Reining, PA-C  fexofenadine-pseudoephedrine (ALLEGRA-D) 60-120 MG 12 hr tablet Take 1 tablet by mouth 2 (two) times daily. 05/07/17   Joni Reining, PA-C  HYDROcodone-homatropine [facility]) 5-1.5 MG/5ML syrup Take 5 mLs by mouth every 6 (six) hours as needed for cough. 05/02/17   Mardella Layman, MD  ibuprofen (ADVIL,MOTRIN) 600 MG tablet Take 1 tablet (600 mg total) by mouth every 8 (eight) hours as needed. 05/07/17   Joni Reining, PA-C    Allergies  Allergen Reactions  . Beta Adrenergic Blockers Other (See Comments)    Vitals dropped "I was pretty much dying"    Family History  Problem Relation Age of  Onset  . Drug abuse Mother   . Cancer Mother        cervix  . Alcohol abuse Father   . Hypertension Maternal Grandmother   . Diabetes Maternal Grandmother   . Hypertension Maternal Grandfather   . Diabetes Maternal Grandfather   . Hypertension Paternal Grandmother   . Hypertension Paternal Grandfather     Social History Social History   Tobacco Use  . Smoking status: Current Every Day Smoker    Packs/day: 0.25    Types: Cigarettes  . Smokeless tobacco: Never Used  Substance Use Topics  . Alcohol use: Yes    Comment: Occasionally  . Drug use: No    Review of Systems Constitutional: Negative for fever. Eyes: Negative for visual complaints ENT: Negative for congestion Cardiovascular: Negative for chest pain. Respiratory: Negative for shortness of breath. Gastrointestinal: Lower abdominal cramping, moderate. Genitourinary: Negative for dysuria. Musculoskeletal: No leg swelling Skin: Negative for rash. Neurological: Negative for headache All other ROS negative  ____________________________________________   PHYSICAL EXAM:  VITAL SIGNS: ED Triage Vitals  Enc Vitals Group     BP 08/09/17 1256 114/75     Pulse Rate 08/09/17 1256 94     Resp 08/09/17 1256 20     Temp 08/09/17 1256 98.2 F (36.8 C)     Temp Source 08/09/17 1256 Oral     SpO2 08/09/17 1256 100 %     Weight 08/09/17  1257 132 lb (59.9 kg)     Height 08/09/17 1257 5\' 4"  (1.626 m)     Head Circumference --      Peak Flow --      Pain Score 08/09/17 1308 1     Pain Loc --      Pain Edu? --      Excl. in GC? --    Constitutional: Alert and oriented. Well appearing and in no distress. Eyes: Normal exam ENT   Head: Normocephalic and atraumatic.   Mouth/Throat: Mucous membranes are moist. Cardiovascular: Normal rate, regular rhythm. No murmur Respiratory: Normal respiratory effort without tachypnea nor retractions. Breath sounds are clear  Gastrointestinal: Soft, mild suprapubic tenderness to  palpation.  No rebound or guarding.  No distention. Musculoskeletal: Nontender with normal range of motion in all extremities.  Neurologic:  Normal speech and language. No gross focal neurologic deficits Skin:  Skin is warm, dry and intact.  Psychiatric: Mood and affect are normal.   ____________________________________________    RADIOLOGY  Ultrasound shows a cystic mass on the left ovary, suspicious for possible ectopic  ____________________________________________   INITIAL IMPRESSION / ASSESSMENT AND PLAN / ED COURSE  Pertinent labs & imaging results that were available during my care of the patient were reviewed by me and considered in my medical decision making (see chart for details).  Patient presents emergency department with vaginal bleeding approximately [redacted] weeks pregnant.  Differential would include threatened miscarriage, subchorionic hemorrhage, miscarriage.  Patient's labs show beta hCG of 2500, O- blood type we will dose RhoGam.  Will obtain an ultrasound to further evaluate.  Patient agreeable to this plan of care.  Exam shows a left cystic mass on the ovary, possible ectopic.  I discussed the patient with Dr. Bonney AidStaebler of OB/GYN who will be down to see the patient and discussed options.  Currently the patient appears well, states mild pain at this time across the entire lower abdomen although states during the ultrasound she is having more pain on the left side.  Patient has been seen by OB/GYN, they have offered to take the patient to the OR for laparoscopy versus follow-up in clinic tomorrow.  This is a desired pregnancy the patient strongly wishes to follow-up tomorrow in clinic.  Dr. Bonney AidStaebler agreeable to this plan of care.  We will discharge patient from the emergency department with very strict return precautions for any abdominal pain lightheadedness.  Patient agreeable. ____________________________________________   FINAL CLINICAL IMPRESSION(S) / ED  DIAGNOSES  Threatened miscarriage Possible ectopic pregnancy   Minna AntisPaduchowski, Jjesus Dingley, MD 08/09/17 [phone]

## 2017-08-09 NOTE — ED Notes (Signed)
Blood Bank notified to send RhoGAM.

## 2017-08-09 NOTE — ED Triage Notes (Signed)
Pt arrives to ED with c/o vaginal bleeding x 1 hour. Believes she is about [redacted] weeks pregnant, states 1 previous miscarriage and 1 living child. Denies going through a pad per hour. States pink/red color, denies clots. States abd cramping. Alert, oriented, ambulatory.

## 2017-08-09 NOTE — Discharge Instructions (Addendum)
Approximately 50% of all patient presenting with ectopic pregnancy will have identifiable risk factors such as prior gonorrhea chlamydia, pelvic inflammatory disease (PID), or infertility treatments (ART).  The discriminatory zone for visualizing a singleton intrauterine pregnancy is an HCG level of approximately 157600mIU/mL to 253000mIU/mL, although the recommended value to rule out an intrauterine pregnancy has been set conservatively high at 355300mIU/mL to account for twin gestations which occur at a rate of approximately 2% of all spontaneous conceptions.  Serial HCG measurements are more helpful in determining the patients risk of ectopic than a single value.  Demonstration of adnexal mass with a hypoechoic center on ultrasound has a positive predictive value of approximately 80% and may misidentify corpus luteum cysts, paratubal cysts, and or hydrosalpinx among others.  An intrauterine gestational sac makes ectopic pregnancy less likely but may represent pseudosac.  The diagnosis of intrauterine pregnancy requires documentation of an intrauterine gestational sac with accompanying yolk sac or fetal pole.  Heterotopic pregnancy is exceedingly rare but may still be present in cases of confirmed intrauterine pregnancy with a reported incidence of 1 in 4,000 to 1 in 30,000.   Ruptured ectopic pregnancy continued to account for 2.7% of all maternal deaths, and the incidence of ectopic pregnancy is reported at approximately 2% of all spontaneous conceptions.  The patient understand the importance of returning for repeat HCG measurement in 48-hrs, as well as follow up ultrasound.  She is currently hemodynamically stable, with a non-acute pelvic and abdominal exam, and reliable.  This is a desired pregnancy.  She was given strict precautions should she develop acute abdominal pain and or orthostatic symptoms.

## 2017-08-09 NOTE — ED Notes (Signed)
OB to bedside at this time.

## 2017-08-09 NOTE — ED Notes (Signed)
Patient transported to Ultrasound 

## 2017-08-09 NOTE — Consult Note (Signed)
Obstetrics & Gynecology Consult H&P   Consulting Department: Emergency Department  Consulting Physician: Harvest Dark, MD  Consulting Question: Possible ectopic pregnancy   History of Present Illness: Patient is a 27 y.o. G2P1011 with sure LMP of 06/27/2017 (EGA [redacted]w[redacted]d presenting with positive home UPT on 08/03/2017 and vaginal spotting and diffuse abdominal pain.  She reports her pain is predominantly right sided, sharp, 6/10.  No vaginal discharge, fevers, chills.  Bleeding has been limited to light pink spotting.  She does have a past medical history significant for ovarian cysts with several Emergency room visits for these.  Her G1 pregnancy was a first trimester miscarriage, G2 pregnancy was an uncomplicated pregnancy which concluded with an uncomplicated term vaginal delivery 4 years ago.   This pregnancy is with a new partner.  It is a planned and desired pregnancy.   Evaluation in ED revealed HCG level of 2500, no evidence of IUD, but complex adnexal process with no normal ovarian tissue visualized in left adnexa.  Small corpus luteum on right.  While the pain is predominantly right sided she did report some tenderness on the left as well during the scan.    Review of Systems:10 point review of systems  Past Medical History:  Past Medical History:  Diagnosis Date  . Double aortic arch   . Double aortic arch   . Hx of varicella   . Ovarian cyst     Past Surgical History:  Past Surgical History:  Procedure Laterality Date  . NO PAST SURGERIES      Obstetric History: G2P1011  Family History:  Family History  Problem Relation Age of Onset  . Drug abuse Mother   . Cancer Mother        cervix  . Alcohol abuse Father   . Hypertension Maternal Grandmother   . Diabetes Maternal Grandmother   . Hypertension Maternal Grandfather   . Diabetes Maternal Grandfather   . Hypertension Paternal Grandmother   . Hypertension Paternal Grandfather     Social History:  Social  History   Socioeconomic History  . Marital status: Single    Spouse name: Not on file  . Number of children: Not on file  . Years of education: Not on file  . Highest education level: Not on file  Social Needs  . Financial resource strain: Not on file  . Food insecurity - worry: Not on file  . Food insecurity - inability: Not on file  . Transportation needs - medical: Not on file  . Transportation needs - non-medical: Not on file  Occupational History  . Not on file  Tobacco Use  . Smoking status: Current Every Day Smoker    Packs/day: 0.25    Types: Cigarettes  . Smokeless tobacco: Never Used  Substance and Sexual Activity  . Alcohol use: Yes    Comment: Occasionally  . Drug use: No  . Sexual activity: No    Birth control/protection: None  Other Topics Concern  . Not on file  Social History Narrative  . Not on file    Allergies:  Allergies  Allergen Reactions  . Beta Adrenergic Blockers Other (See Comments)    Vitals dropped "I was pretty much dying"    Medications: Prior to Admission medications   Medication Sig Start Date End Date Taking? Authorizing Provider  benzonatate (TESSALON PERLES) 100 MG capsule Take 1 capsule (100 mg total) by mouth 3 (three) times daily as needed for cough. 05/07/17   SSable Feil PA-C  fexofenadine-pseudoephedrine (ALLEGRA-D) 60-120 MG 12 hr tablet Take 1 tablet by mouth 2 (two) times daily. 05/07/17   Sable Feil, PA-C  HYDROcodone-homatropine [facility]) 5-1.5 MG/5ML syrup Take 5 mLs by mouth every 6 (six) hours as needed for cough. 05/02/17   Vanessa Kick, MD  ibuprofen (ADVIL,MOTRIN) 600 MG tablet Take 1 tablet (600 mg total) by mouth every 8 (eight) hours as needed. 05/07/17   Sable Feil, PA-C    Physical Exam Vitals: Blood pressure 114/75, pulse 94, temperature 98.2 F (36.8 C), temperature source Oral, resp. rate 20, height '5\' 4"'  (1.626 m), weight 132 lb (59.9 kg), last menstrual period 06/27/2017, SpO2 100 %. General:  NAD HEENT: normocephalic, anicteric Pulmonary: No increased work of breathing Cardiovascular: RRR, distal pulses 2+ Abdomen: NABS, soft, non-distended, reports mild tenderness of the RLQ, no rebound, no guarding, non-acute abdomen Genitourinary: deferred Extremities: no edema, erythema, or tenderness Neurologic: Grossly intact Psychiatric: mood appropriate, affect full  Labs: Results for orders placed or performed during the hospital encounter of 08/09/17 (from the past 72 hour(s))  hCG, quantitative, pregnancy     Status: Abnormal   Collection Time: 08/09/17  1:06 PM  Result Value Ref Range   hCG, Beta Chain, Quant, S 2,593 (H) <5 mIU/mL    Comment:          GEST. AGE      CONC.  (mIU/mL)   <=1 WEEK        5 - 50     2 WEEKS       50 - 500     3 WEEKS       100 - 10,000     4 WEEKS     1,000 - 30,000     5 WEEKS     3,500 - 115,000   6-8 WEEKS     12,000 - 270,000    12 WEEKS     15,000 - 220,000        FEMALE AND NON-PREGNANT FEMALE:     LESS THAN 5 mIU/mL Performed at [facility], [address] [place], [place] [address]   CBC     Status: None   Collection Time: 08/09/17  1:06 PM  Result Value Ref Range   WBC 7.7 3.6 - 11.0 K/uL   RBC 4.59 3.80 - 5.20 MIL/uL   Hemoglobin 14.1 12.0 - 16.0 g/dL   HCT 41.1 35.0 - 47.0 %   MCV 89.5 80.0 - 100.0 fL   MCH 30.6 26.0 - 34.0 pg   MCHC 34.2 32.0 - 36.0 g/dL   RDW 13.3 11.5 - 14.5 %   Platelets 237 150 - 440 K/uL    Comment: Performed at [facility], [address], [place], [place] [address]  Basic metabolic panel     Status: Abnormal   Collection Time: 08/09/17  1:06 PM  Result Value Ref Range   Sodium 135 135 - 145 mmol/L   Potassium 3.6 3.5 - 5.1 mmol/L   Chloride 104 101 - 111 mmol/L   CO2 25 22 - 32 mmol/L   Glucose, Bld 96 65 - 99 mg/dL   BUN 9 6 - 20 mg/dL   Creatinine, Ser 0.68 0.44 - 1.00 mg/dL   Calcium 8.8 (L) 8.9 - 10.3 mg/dL   GFR calc non Af Amer >60 >60 mL/min   GFR calc Af Amer >60  >60 mL/min    Comment: (NOTE) The eGFR has been calculated using the CKD EPI equation. This calculation  has not been validated in all clinical situations. eGFR's persistently <60 mL/min signify possible Chronic Kidney Disease.    Anion gap 6 5 - 15    Comment: Performed at [facility], [address] [place], [place] [address]  Type and screen [place]     Status: None   Collection Time: 08/09/17  1:06 PM  Result Value Ref Range   ABO/RH(D) O NEG    Antibody Screen NEG    Sample Expiration      08/12/2017 Performed at [facility], [address] [place], [place] [address]   Pregnancy, urine POC     Status: Abnormal   Collection Time: 08/09/17  1:12 PM  Result Value Ref Range   Preg Test, Ur POSITIVE (A) NEGATIVE    Comment:        THE SENSITIVITY OF THIS METHODOLOGY IS >24 mIU/mL   Rhogam injection     Status: None (Preliminary result)   Collection Time: 08/09/17  4:06 PM  Result Value Ref Range   Unit Number [id number]    Blood Component Type RHIG    Unit division 00    Status of Unit ISSUED    Transfusion Status      OK TO TRANSFUSE Performed at [facility], [address] [place],  [address]     Imaging US Ob Comp Less 14 Wks  Result Date: 08/09/2017 CLINICAL DATA:  First-trimester pregnancy with vaginal bleeding for 1 hour. EXAM: OBSTETRIC <14 WK [place] AND TRANSVAGINAL OB US TECHNIQUE: Both transabdominal and transvaginal ultrasound examinations were performed for complete evaluation of the gestation as well as the maternal uterus, adnexal regions, and pelvic cul-de-sac. Transvaginal technique was performed to assess early pregnancy. COMPARISON:  None. FINDINGS: No intrauterine gestational sac or cyst is seen. Reportedly the patient has had minimal spotting and presented for is diffuse pelvic pain which is increasing. In the left adnexa is a complex cystic mass measuring up to 2.1 cm. A discrete left ovary  is not identified to determine if this is intra- or extraovarian. Cyst/ follicle in the right ovary measuring up to 27 mm. Pelvic fluid that is small volume simple appearing. These results were called by telephone at the time of interpretation on 08/09/2017 at 4:57 pm to Dr. Harvest Dark , who verbally acknowledged these results. IMPRESSION: 1. Complex cystic mass in the left adnexa measuring up to 2.1 cm, a discrete left ovary is not identified to determine if this is intra- or extra-ovarian. Given beta and lack of intrauterine gestational sac, findings are primarily concerning for a left ectopic pregnancy. 2. Small volume simple appearing pelvic fluid. Electronically Signed   By: Monte Fantasia M.D.   On: 08/09/2017 16:59   US Ob Transvaginal  Result Date: 08/09/2017 CLINICAL DATA:  First-trimester pregnancy with vaginal bleeding for 1 hour. EXAM: OBSTETRIC <14 WK [place] AND TRANSVAGINAL OB US TECHNIQUE: Both transabdominal and transvaginal ultrasound examinations were performed for complete evaluation of the gestation as well as the maternal uterus, adnexal regions, and pelvic cul-de-sac. Transvaginal technique was performed to assess early pregnancy. COMPARISON:  None. FINDINGS: No intrauterine gestational sac or cyst is seen. Reportedly the patient has had minimal spotting and presented for is diffuse pelvic pain which is increasing. In the left adnexa is a complex cystic mass measuring up to 2.1 cm. A discrete left ovary is not identified to determine if this is intra- or extraovarian. Cyst/ follicle in the right ovary measuring up to 27  mm. Pelvic fluid that is small volume simple appearing. These results were called by telephone at the time of interpretation on 08/09/2017 at 4:57 pm to Dr. Harvest Dark , who verbally acknowledged these results. IMPRESSION: 1. Complex cystic mass in the left adnexa measuring up to 2.1 cm, a discrete left ovary is not identified to determine if this is intra- or  extra-ovarian. Given beta and lack of intrauterine gestational sac, findings are primarily concerning for a left ectopic pregnancy. 2. Small volume simple appearing pelvic fluid. Electronically Signed   By: Monte Fantasia M.D.   On: 08/09/2017 16:59     EMS of 9.24m    Thick walled and double lumen concerning for ectopic    Vascular flow in the cyst  Assessment: 27y.o. G2P1011 with left adnexal cyst and positive HCG at the discriminatory zone with no visualized IUP  Plan:  The patient only identifiable risk factors which would increase her risk of ectopic pregnancy is a prior positive chlamydia culture back in 2016.  Approximately 50% of all patient presenting with ectopic pregnancy will have identifiable risk factors such as prior GC/CT, PID, or ART.  The discriminatory zone for visualizing a singleton intrauterine pregnancy is an HCG level of approximately 15046m/mL to 250060mmL, although the recommended value to rule out an intrauterine pregnancy has been set conservatively high at 3500m42mL to account for twin gestations which occur at a rate of approximately 2% of all spontaneous conceptions.  Serial HCG measurements are more helpful in determining the patients risk of ectopic than a single value.  Demonstration of adnexal mass with a hypoechoic center on ultrasound has a positive predictive value of approximately 80% and may misidentify corpus luteum cysts, paratubal cysts, and or hydrosalpinx among others.  An intrauterine gestational sac makes ectopic pregnancy less likely but may represent pseudosac.  The diagnosis of intrauterine pregnancy requires documentation of an intrauterine gestational sac with accompanying yolk sac or fetal pole.  Heterotopic pregnancy is exceedingly rare but may still be present in cases of confirmed intrauterine pregnancy with a reported incidence of 1 in 4,000 to 1 in 30,000.   Ruptured ectopic pregnancy continued to account for 2.7% of all maternal  deaths, and the incidence of ectopic pregnancy is reported at approximately 2% of all spontaneous conceptions.  The patient was offered 48-hr repeat HCG, MTX treatment, and laparoscopy.  At present given that this is a desired pregnancy she opts for follow up HCG.  The patient understand the importance of returning for repeat HCG measurement in 48-hrs, as well as follow up ultrasound.  She is currently hemodynamically stable, with a non-acute pelvic and abdominal exam, and reliable.  This is a desired pregnancy.  She was given strict precautions should she develop acute abdominal pain and or orthostatic symptoms.  ACOG Practice Bulletin 191 February 2018 "Ectopic Pregnancy"   - Rh negative rhogam administered in ED - patient to follow up tomorrow 0800 for repeat ultrasound and labs - LFT's added on, progesterone level as well - if limited or no rise in HCG patient interested in MTX course  45 Minutes was spend during this encounter. Prior labs, imaging were reviewed independently.

## 2017-08-10 ENCOUNTER — Encounter: Payer: Self-pay | Admitting: Obstetrics and Gynecology

## 2017-08-10 ENCOUNTER — Other Ambulatory Visit: Payer: Self-pay | Admitting: Obstetrics and Gynecology

## 2017-08-10 ENCOUNTER — Ambulatory Visit (INDEPENDENT_AMBULATORY_CARE_PROVIDER_SITE_OTHER): Payer: Self-pay | Admitting: Obstetrics and Gynecology

## 2017-08-10 VITALS — BP 106/68 | HR 96 | Ht 64.0 in | Wt 132.0 lb

## 2017-08-10 DIAGNOSIS — O3680X0 Pregnancy with inconclusive fetal viability, not applicable or unspecified: Secondary | ICD-10-CM

## 2017-08-10 DIAGNOSIS — Z3490 Encounter for supervision of normal pregnancy, unspecified, unspecified trimester: Secondary | ICD-10-CM

## 2017-08-10 LAB — RHOGAM INJECTION: Unit division: 0

## 2017-08-10 MED ORDER — HYDROCODONE-ACETAMINOPHEN 5-325 MG PO TABS: 1.0000 | ORAL_TABLET | Freq: Four times a day (QID) | ORAL | 0 refills | Status: DC | PRN

## 2017-08-10 NOTE — Progress Notes (Signed)
Obstetrics & Gynecology Office Visit   Chief Complaint:  Chief Complaint  Patient presents with  . ER follow up    Early pregnancy bleeding/sharpe pains    History of Present Illness: 27 year old G3P1011 at 5725w2d by LMP presenting for ER follow up of pregnancy of unknown location.  The patient presented with vaginal spotting, positive urine pregnancy test on 08/03/2017.  She has a history of ovarian cysts and pelvic pain with several ER presentations for this.  At the time of her presentation she did report right sided pelvic pain.  Per her report this precede the pregnancy and had been present for the better part of a month.  Ultrasound in the Emergency department showed no IUP EMS of 9mm and no right corpus luteum cyst, complex appearing right adnexal structure with no ovarian tissue visualized.  Imaging findings were discussed with the patient and she was given the option of close follow up and trending of HCG levels vs MTX or laparoscopy.  This is a desired pregnancy and the patient opted for close follow up and trending of HCG levels.  The patient presents today and states bleeding has increased from yesterday.  She is still experiencing some intermittent cramping which has gotten slightly worse than yesterday.  Still more right sided.  No nausea, vomiting, fevers, or chills.  The bleeding has been free of clots or tissue.  The LMP patient reports is a sure LMP and she reports regular monthly menses.     Review of Systems: Review of Systems  Constitutional: Negative for chills and fever.  Gastrointestinal: Positive for abdominal pain. Negative for constipation, diarrhea, nausea and vomiting.  Genitourinary: Negative for dysuria.  Neurological: Negative for dizziness.  Endo/Heme/Allergies: Does not bruise/bleed easily.     Past Medical History:  Past Medical History:  Diagnosis Date  . Double aortic arch   . Double aortic arch   . Hx of varicella   . Ovarian cyst     Past  Surgical History:  Past Surgical History:  Procedure Laterality Date  . NO PAST SURGERIES      Gynecologic History: Patient's last menstrual period was 06/27/2017.  Obstetric History: G3P1011  Family History:  Family History  Problem Relation Age of Onset  . Drug abuse Mother   . Cancer Mother        cervix  . Alcohol abuse Father   . Hypertension Maternal Grandmother   . Diabetes Maternal Grandmother   . Hypertension Maternal Grandfather   . Diabetes Maternal Grandfather   . Hypertension Paternal Grandmother   . Hypertension Paternal Grandfather     Social History:  Social History   Socioeconomic History  . Marital status: Single    Spouse name: Not on file  . Number of children: Not on file  . Years of education: Not on file  . Highest education level: Not on file  Social Needs  . Financial resource strain: Not on file  . Food insecurity - worry: Not on file  . Food insecurity - inability: Not on file  . Transportation needs - medical: Not on file  . Transportation needs - non-medical: Not on file  Occupational History  . Not on file  Tobacco Use  . Smoking status: Current Every Day Smoker    Packs/day: 0.25    Types: Cigarettes  . Smokeless tobacco: Never Used  Substance and Sexual Activity  . Alcohol use: Yes    Comment: Occasionally  . Drug use: No  .  Sexual activity: Yes    Birth control/protection: None  Other Topics Concern  . Not on file  Social History Narrative  . Not on file    Allergies:  Allergies  Allergen Reactions  . Beta Adrenergic Blockers Other (See Comments)    Vitals dropped "I was pretty much dying"    Medications: Prior to Admission medications   Medication Sig Start Date End Date Taking? Authorizing Provider  HYDROcodone-homatropine (HYCODAN) 5-1.5 MG/5ML syrup Take 5 mLs by mouth every 6 (six) hours as needed for cough. Patient not taking: Reported on 08/10/2017 05/02/17   Mardella Layman, MD    Physical Exam Vitals:    Blood pressure 106/68, pulse 96, height 5\' 4"  (1.626 m), weight 132 lb (59.9 kg), last menstrual period 06/27/2017.  Patient's last menstrual period was 06/27/2017.  General: NAD HEENT: normocephalic, anicteric Thyroid: no enlargement, no palpable nodules Pulmonary: No increased work of breathing Abdomen: Soft, non-tender, non-distended.  Umbilicus without lesions.  No hepatomegaly, splenomegaly or masses palpable. No evidence of hernia  Genitourinary:  External: Normal external female genitalia.  Normal urethral meatus, normal Bartholin's and Skene's glands.    Vagina: Normal vaginal mucosa, no evidence of prolapse.    Cervix: Grossly normal in appearance, no bleeding  Uterus: Non-enlarged, mobile, normal contour.  No CMT  Adnexa: ovaries non-enlarged, no adnexal masses  Rectal: deferred  Lymphatic: no evidence of inguinal lymphadenopathy Extremities: no edema, erythema, or tenderness Neurologic: Grossly intact Psychiatric: mood appropriate, affect full  Female chaperone present for pelvic portions of the physical exam  Bedside Transvaginal Ultrasound Uterus: anteverted, anteflexed.  Ems of 1.38cm, small GS to early to date which could also represent pseudosac but appears to have a decidual reaction is noted in the fundal portion of the uterus ROV: Normal 2.05 x 1.29cm corpus luteum cyst LOV: LOV is clearly visualized several small solicles and what appears to be a small hemorrhagic cyst.  This appears to be the cyst visualized on ER ultrasound yesterday.  It is clearly within the ovary not adjacent to the ovary.  The patient is not particularly tender on scanning either adnexa Other: no free fluid  Assessment: 27 y.o. G3P1011presenting with pregnancy of unknown location  Plan: Problem List Items Addressed This Visit    None    Visit Diagnoses    Pregnancy of unknown anatomic location    -  Primary   Relevant Orders   Beta HCG, Quant     1) Pregnancy of unknown location -  based on ultrasound findings today I favor an early intrauterine pregnancy.  Given bleeding concern for possible early Ab/threatened Ab.  Given that I can not completely rule out pseudosac I stressed the importance of follow up.   - HCG today, if trending up obtain repeat measurement tomorrow to verify doubling, if trending down trend to 0  - Ectopic precautions given - I did give the patient an Rx for norco, given time frame of symptoms and history I suspect this may represents a resolving hemorrhagic cyst on the left ovary - Received rhogam in ER.  - I stressed with the patient that while I favor an intrauterine pregnancy based on imaging findings today, ectopic remains in the differential.  The left ovarian cyst could be an ectopic but with overall incidence of ectopic pregnancy representing only 2% of clinically encountered pregnancies and of those 2% only 1% are ovarian ectopics this is relatively unlikely.  I also can not comment on whether this is a viable IUP given  first trimester miscarriage rate of 20%.

## 2017-08-11 ENCOUNTER — Other Ambulatory Visit
Admission: RE | Admit: 2017-08-11 | Discharge: 2017-08-11 | Disposition: A | Discharge status: Home / Self Care | Payer: Medicaid Other | Source: Ambulatory Visit | Attending: Obstetrics and Gynecology | Admitting: Obstetrics and Gynecology

## 2017-08-11 DIAGNOSIS — Z3490 Encounter for supervision of normal pregnancy, unspecified, unspecified trimester: Secondary | ICD-10-CM | POA: Insufficient documentation

## 2017-08-11 DIAGNOSIS — O3680X0 Pregnancy with inconclusive fetal viability, not applicable or unspecified: Secondary | ICD-10-CM

## 2017-08-11 LAB — PROGESTERONE: Progesterone: 9.9 ng/mL

## 2017-08-11 LAB — BETA HCG QUANT (REF LAB): hCG Quant: 2218 m[IU]/mL

## 2017-08-11 LAB — HCG, QUANTITATIVE, PREGNANCY: hCG, Beta Chain, Quant, S: 2521 m[IU]/mL — ABNORMAL HIGH (ref <5)

## 2017-08-12 ENCOUNTER — Other Ambulatory Visit: Payer: Self-pay | Admitting: Obstetrics and Gynecology

## 2017-08-12 ENCOUNTER — Emergency Department: Admission: EM | Admit: 2017-08-12 | Discharge: 2017-08-12 | Discharge status: Absent without leave | Payer: Self-pay

## 2017-08-12 NOTE — Progress Notes (Signed)
Called and spoke to patient about her beta hcg results. Patient has a pregnancy of unknown location, but it is not a normal pregnancy since the patient's levels have not risen adequately which would be considered a 40% elevation after 48 hours given her initial beta hcg level. Discussed options with patient and she desires to have methotrexate therapy. Discussed testing at days 4 and 7 as part of single does regimen. Notified ER that patient would be coming of methotrexate injection.  Adelene Idlerhristanna Jaylynn Siefert MD 08/12/17 12:20 AM

## 2017-08-12 NOTE — Progress Notes (Signed)
Pt spoke with Dr. Bjorn PippinSchumann on the phone and will follow up in the office tomorrow at [facility]9am for methotrexate injection.  I offered patient the option to stay in the ED to be evaluated by EDP if she wanted to, however, patient declined, informed patient that if symptoms worsened or changed and wanted to be seen she could be evaluated in the ED.

## 2017-08-12 NOTE — Progress Notes (Signed)
Patient came to the hospital Sunday around noon to receive methotrexate. Because of hosptal policy requires a chemo nurse to inject methotrexate her injection could not be given. I spoke with the nursing supervisor Selena BattenKim about finding a person to perform the injection, but she was not able to find anyone in house. [facility] do the injection for the patient. I spoke to the patient again on the phone about options. I offered her to [facility] [place], to wait and have the injection tomorrow at the office, or if she was having severe abdominal pain or prefered surgical management I offered her to perform a laparoscopic surgery today to remove the effected tube. The patient elected to wait until tomorrow to have the injection performed in the office. Patient did state that she has continued pelvic pain and spotting. I discussed risks of ectopic pregnancy rupture with the patient and asked her to seek immediate care for severe pain, lightheadedness, dizziness, nausea or vomiting, or heavy vaginal bleeding. Adelene Idlerhristanna Schuman MD  08/12/17 9:08 PM

## 2017-08-12 NOTE — Progress Notes (Signed)
Pt presented to the ED for methotrexate injection.  L & D notified, but states they do not administer that medication.  Informed charge RN, Tammy SoursGreg, states we also do not give that medication in the ED and it is only administered by certified chemo nurse.  Called 1C the oncology unit, there is currently no nurses working that administer chemo medications.  Pt is to receive this medication due to vaginal bleeding and abdominal pain.  I spoke with Dr. Bjorn PippinSchumann who instructed the patient to come to the ED last night or today.  I informed Dr. Bjorn PippinSchumann that we do not administer the medication in the ED due to the need to be certified for chemo administration.  Dr. Bjorn PippinSchumann states she needs to call her partner to find out the procedures for patient to get the injection.  Dr. Bjorn PippinSchumann did speak to patient on the phone and verified that the vaginal bleeding and abdominal pain she is having is the same and that the patient does not need evaluation by L & D.

## 2017-08-13 ENCOUNTER — Other Ambulatory Visit: Payer: Self-pay | Admitting: Obstetrics and Gynecology

## 2017-08-13 ENCOUNTER — Emergency Department
Admission: EM | Admit: 2017-08-13 | Discharge: 2017-08-13 | Disposition: A | Discharge status: Home / Self Care | Payer: Medicaid Other | Attending: Emergency Medicine | Admitting: Emergency Medicine

## 2017-08-13 ENCOUNTER — Encounter: Payer: Self-pay | Admitting: Intensive Care

## 2017-08-13 ENCOUNTER — Encounter: Payer: Self-pay | Admitting: Obstetrics and Gynecology

## 2017-08-13 ENCOUNTER — Telehealth: Payer: Self-pay | Admitting: Obstetrics and Gynecology

## 2017-08-13 DIAGNOSIS — Z3A01 Less than 8 weeks gestation of pregnancy: Secondary | ICD-10-CM | POA: Diagnosis not present

## 2017-08-13 DIAGNOSIS — O3680X0 Pregnancy with inconclusive fetal viability, not applicable or unspecified: Secondary | ICD-10-CM

## 2017-08-13 DIAGNOSIS — F17210 Nicotine dependence, cigarettes, uncomplicated: Secondary | ICD-10-CM | POA: Diagnosis not present

## 2017-08-13 DIAGNOSIS — O99331 Smoking (tobacco) complicating pregnancy, first trimester: Secondary | ICD-10-CM | POA: Insufficient documentation

## 2017-08-13 DIAGNOSIS — O9989 Other specified diseases and conditions complicating pregnancy, childbirth and the puerperium: Secondary | ICD-10-CM | POA: Diagnosis present

## 2017-08-13 DIAGNOSIS — Z3490 Encounter for supervision of normal pregnancy, unspecified, unspecified trimester: Secondary | ICD-10-CM

## 2017-08-13 DIAGNOSIS — O009 Unspecified ectopic pregnancy without intrauterine pregnancy: Secondary | ICD-10-CM

## 2017-08-13 DIAGNOSIS — O00202 Left ovarian pregnancy without intrauterine pregnancy: Secondary | ICD-10-CM | POA: Insufficient documentation

## 2017-08-13 LAB — CBC
HCT: 40.1 % (ref 35.0–47.0)
Hemoglobin: 13.6 g/dL (ref 12.0–16.0)
MCH: 30.4 pg (ref 26.0–34.0)
MCHC: 33.8 g/dL (ref 32.0–36.0)
MCV: 89.7 fL (ref 80.0–100.0)
PLATELETS: 217 10*3/uL (ref 150–440)
RBC: 4.47 MIL/uL (ref 3.80–5.20)
RDW: 13.1 % (ref 11.5–14.5)
WBC: 6.3 10*3/uL (ref 3.6–11.0)

## 2017-08-13 LAB — COMPREHENSIVE METABOLIC PANEL
ALK PHOS: 44 U/L (ref 38–126)
ALT: 17 U/L (ref 14–54)
AST: 21 U/L (ref 15–41)
Albumin: 4.3 g/dL (ref 3.5–5.0)
Anion gap: 6 (ref 5–15)
BILIRUBIN TOTAL: 0.6 mg/dL (ref 0.3–1.2)
BUN: 10 mg/dL (ref 6–20)
CALCIUM: 9 mg/dL (ref 8.9–10.3)
CO2: 26 mmol/L (ref 22–32)
CREATININE: 0.75 mg/dL (ref 0.44–1.00)
Chloride: 104 mmol/L (ref 101–111)
GFR calc non Af Amer: >60 mL/min (ref >60)
GLUCOSE: 124 mg/dL — AB (ref 65–99)
Potassium: 4.5 mmol/L (ref 3.5–5.1)
SODIUM: 136 mmol/L (ref 135–145)
TOTAL PROTEIN: 6.7 g/dL (ref 6.5–8.1)

## 2017-08-13 LAB — HCG, QUANTITATIVE, PREGNANCY: HCG, BETA CHAIN, QUANT, S: 1838 m[IU]/mL — AB (ref <5)

## 2017-08-13 LAB — BETA HCG QUANT (REF LAB)

## 2017-08-13 LAB — LIPASE, BLOOD: Lipase: 34 U/L (ref 11–51)

## 2017-08-13 MED ORDER — OXYCODONE-ACETAMINOPHEN 5-325 MG PO TABS: 1.0000 | ORAL_TABLET | Freq: Four times a day (QID) | ORAL | 0 refills | Status: DC | PRN

## 2017-08-13 MED ORDER — METHOTREXATE SODIUM CHEMO INJECTION 50 MG/2ML
50.0000 mg/m2 | Freq: Once | INTRAMUSCULAR | Status: AC
  Administered 2017-08-13: 82.5 mg via INTRAMUSCULAR
  Filled 2017-08-13: qty 3.3

## 2017-08-13 MED ORDER — METHOTREXATE INJECTION FOR WOMEN'S HOSPITAL: 50.0000 mg/m2 | Freq: Once | INTRAMUSCULAR | Status: DC

## 2017-08-13 MED ORDER — OXYCODONE-ACETAMINOPHEN 5-325 MG PO TABS
2.0000 | ORAL_TABLET | Freq: Once | ORAL | Status: AC
  Administered 2017-08-13: 2 via ORAL
  Filled 2017-08-13: qty 2

## 2017-08-13 NOTE — Telephone Encounter (Signed)
After speaking to multiple [facility] employees, I was instructed by Aura Campsonna H (Ast Director of Oncology for Inpatient) to have patient go to ED and she will have them to contact her when the patient arrives.  I have notified the patient to go to ED. Pt states she was about to head there anyway as she believes her cyst has ruptured bc she is in pain. ED Triage nurse Judeth CornfieldStephanie notified pt on her way & additional info of believing cyst has ruptured. Orders for methotrexate are in per [facility].

## 2017-08-13 NOTE — Telephone Encounter (Signed)
Dr Rosetta PosnerSchuman Called and stated pt needs methotrexate. Can you please call to get that set up? Thank you ma'am.

## 2017-08-13 NOTE — ED Triage Notes (Addendum)
Patient has cyst on ovary and started experiencing worsening pain today. Was seen here yesterday. Patient reports calling her Doctor and they instructed her to come here for methotrexate shot and possibly her cyst rupturing. Ambulatory in triage. Patient reports she currently is pregnant with tubal pregnancy and that is why she needs this shot methotrexate her doctor sent her for

## 2017-08-13 NOTE — ED Notes (Signed)
Pt states she has tubal pregnancy and ovarian cyst. Pain to RLQ and LLQ. Sent to ER for shot of methotrexate.

## 2017-08-13 NOTE — Progress Notes (Signed)
Patient with plateau in HCG, pregnancy of unknown location choosing for MTX administration, no infusion nurse available over the weekend  BSA 1.645  MTX dosed at 50mg /m^2 or 80mg 

## 2017-08-13 NOTE — ED Notes (Signed)
Per pharmacy, [facility] to make injection, pharmacy receive injection and then to ER

## 2017-08-13 NOTE — Telephone Encounter (Signed)
Pt has orders for u/s. Next available per AMS but she does need a formal visit. [facility]

## 2017-08-13 NOTE — Discharge Instructions (Addendum)
Please take your pain medication as needed for severe symptoms and follow-up with your OB gynecologist this coming Thursday for a recheck.  Return to the emergency department sooner for any concerns whatsoever.  It was a pleasure to take care of you today, and thank you for coming to our emergency department.  If you have any questions or concerns before leaving please ask the nurse to grab me and I'm more than happy to go through your aftercare instructions again.  If you were prescribed any opioid pain medication today such as Norco, Vicodin, Percocet, morphine, hydrocodone, or oxycodone please make sure you do not drive when you are taking this medication as it can alter your ability to drive safely.  If you have any concerns once you are home that you are not improving or are in fact getting worse before you can make it to your follow-up appointment, please do not hesitate to call 911 and come back for further evaluation.  Merrily BrittleNeil Jameer Storie, MD  Results for orders placed or performed during the hospital encounter of 08/13/17  Lipase, blood  Result Value Ref Range   Lipase 34 11 - 51 U/L  Comprehensive metabolic panel  Result Value Ref Range   Sodium 136 135 - 145 mmol/L   Potassium 4.5 3.5 - 5.1 mmol/L   Chloride 104 101 - 111 mmol/L   CO2 26 22 - 32 mmol/L   Glucose, Bld 124 (H) 65 - 99 mg/dL   BUN 10 6 - 20 mg/dL   Creatinine, Ser 6.960.75 0.44 - 1.00 mg/dL   Calcium 9.0 8.9 - 29.510.3 mg/dL   Total Protein 6.7 6.5 - 8.1 g/dL   Albumin 4.3 3.5 - 5.0 g/dL   AST 21 15 - 41 U/L   ALT 17 14 - 54 U/L   Alkaline Phosphatase 44 38 - 126 U/L   Total Bilirubin 0.6 0.3 - 1.2 mg/dL   GFR calc non Af Amer >60 >60 mL/min   GFR calc Af Amer >60 >60 mL/min   Anion gap 6 5 - 15  CBC  Result Value Ref Range   WBC 6.3 3.6 - 11.0 K/uL   RBC 4.47 3.80 - 5.20 MIL/uL   Hemoglobin 13.6 12.0 - 16.0 g/dL   HCT 28.440.1 13.235.0 - 44.047.0 %   MCV 89.7 80.0 - 100.0 fL   MCH 30.4 26.0 - 34.0 pg   MCHC 33.8 32.0 - 36.0  g/dL   RDW 10.213.1 72.511.5 - 36.614.5 %   Platelets 217 150 - 440 K/uL   [place] Ob Comp Less 14 Wks  Result Date: 08/09/2017 CLINICAL DATA:  First-trimester pregnancy with vaginal bleeding for 1 hour. EXAM: OBSTETRIC <14 WK US AND TRANSVAGINAL OB US TECHNIQUE: Both transabdominal and transvaginal ultrasound examinations were performed for complete evaluation of the gestation as well as the maternal uterus, adnexal regions, and pelvic cul-de-sac. Transvaginal technique was performed to assess early pregnancy. COMPARISON:  None. FINDINGS: No intrauterine gestational sac or cyst is seen. Reportedly the patient has had minimal spotting and presented for is diffuse pelvic pain which is increasing. In the left adnexa is a complex cystic mass measuring up to 2.1 cm. A discrete left ovary is not identified to determine if this is intra- or extraovarian. Cyst/ follicle in the right ovary measuring up to 27 mm. Pelvic fluid that is small volume simple appearing. These results were called by telephone at the time of interpretation on 08/09/2017 at 4:57 pm to Dr. Minna AntisKEVIN PADUCHOWSKI , who verbally acknowledged  these results. IMPRESSION: 1. Complex cystic mass in the left adnexa measuring up to 2.1 cm, a discrete left ovary is not identified to determine if this is intra- or extra-ovarian. Given beta and lack of intrauterine gestational sac, findings are primarily concerning for a left ectopic pregnancy. 2. Small volume simple appearing pelvic fluid. Electronically Signed   By: Marnee Spring M.D.   On: 08/09/2017 16:59   US Ob Transvaginal  Result Date: 08/09/2017 CLINICAL DATA:  First-trimester pregnancy with vaginal bleeding for 1 hour. EXAM: OBSTETRIC <14 WK [place] AND TRANSVAGINAL OB US TECHNIQUE: Both transabdominal and transvaginal ultrasound examinations were performed for complete evaluation of the gestation as well as the maternal uterus, adnexal regions, and pelvic cul-de-sac. Transvaginal technique was performed to assess early  pregnancy. COMPARISON:  None. FINDINGS: No intrauterine gestational sac or cyst is seen. Reportedly the patient has had minimal spotting and presented for is diffuse pelvic pain which is increasing. In the left adnexa is a complex cystic mass measuring up to 2.1 cm. A discrete left ovary is not identified to determine if this is intra- or extraovarian. Cyst/ follicle in the right ovary measuring up to 27 mm. Pelvic fluid that is small volume simple appearing. These results were called by telephone at the time of interpretation on 08/09/2017 at 4:57 pm to Dr. Minna Antis , who verbally acknowledged these results. IMPRESSION: 1. Complex cystic mass in the left adnexa measuring up to 2.1 cm, a discrete left ovary is not identified to determine if this is intra- or extra-ovarian. Given beta and lack of intrauterine gestational sac, findings are primarily concerning for a left ectopic pregnancy. 2. Small volume simple appearing pelvic fluid. Electronically Signed   By: Marnee Spring M.D.   On: 08/09/2017 16:59

## 2017-08-13 NOTE — Progress Notes (Signed)
Day 3 (Thursday and Day 08/16/17) day 7 HCG (Sunday  orders placed 08/19/17)

## 2017-08-13 NOTE — ED Provider Notes (Signed)
[facility] Emergency Department Provider Note  ____________________________________________   First MD Initiated Contact with Patient 08/13/17 1207     (approximate)  I have reviewed the triage vital signs and the nursing notes.   HISTORY  Chief Complaint Cyst   HPI Alexa Guerra is a 27 y.o. female who was sent to the emergency department by her [facility] gynecology office for possible ectopic pregnancy.  She is G3 P1 A1 roughly [redacted] weeks pregnant with a pregnancy of unclear location.  She was initially seen in our emergency department 4 days ago and had follow-up with [facility] gynecology the following day.  Her beta hCG is not appropriately rising and it is felt that she either has an as yet on the visualized miscarriage versus a left ovarian ectopic pregnancy.  She was advised to receive methotrexate yesterday however there were no nurses available who could perform it so she was called to come to the emergency department today.  Insidious onset slowly progressive severe left lower abdominal pain.  Improved with opiates and worsened with movement.  She has had some nausea but no vomiting.  Past Medical History:  Diagnosis Date  . Double aortic arch   . Double aortic arch   . Hx of varicella   . Ovarian cyst     There are no active problems to display for this patient.   Past Surgical History:  Procedure Laterality Date  . NO PAST SURGERIES      Prior to Admission medications   Medication Sig Start Date End Date Taking? Authorizing Provider  HYDROcodone-acetaminophen (NORCO/VICODIN) 5-325 MG tablet Take 1 tablet by mouth every 6 (six) hours as needed. 08/10/17   Vena Austria, MD  oxyCODONE-acetaminophen (ROXICET) 5-325 MG tablet Take 1 tablet by mouth every 6 (six) hours as needed for severe pain. 08/13/17   Merrily Brittle, MD    Allergies Beta adrenergic blockers  Family History  Problem Relation Age of Onset  . Drug abuse Mother   . Cancer Mother          cervix  . Alcohol abuse Father   . Hypertension Maternal Grandmother   . Diabetes Maternal Grandmother   . Hypertension Maternal Grandfather   . Diabetes Maternal Grandfather   . Hypertension Paternal Grandmother   . Hypertension Paternal Grandfather     Social History Social History   Tobacco Use  . Smoking status: Current Every Day Smoker    Packs/day: 0.25    Types: Cigarettes  . Smokeless tobacco: Never Used  Substance Use Topics  . Alcohol use: Yes    Comment: Occasionally  . Drug use: No    Review of Systems Constitutional: No fever/chills Eyes: No visual changes. ENT: No sore throat. Cardiovascular: Denies chest pain. Respiratory: Denies shortness of breath. Gastrointestinal: Positive for abdominal pain.  Positive for nausea, no vomiting.  No diarrhea.  No constipation. Genitourinary: Negative for dysuria. Musculoskeletal: Negative for back pain. Skin: Negative for rash. Neurological: Negative for headaches, focal weakness or numbness.   ____________________________________________   PHYSICAL EXAM:  VITAL SIGNS: ED Triage Vitals  Enc Vitals Group     BP 08/13/17 1136 114/60     Pulse Rate 08/13/17 1136 87     Resp 08/13/17 1136 16     Temp 08/13/17 1136 97.7 F (36.5 C)     Temp Source 08/13/17 1136 Oral     SpO2 08/13/17 1136 100 %     Weight 08/13/17 1137 132 lb (59.9 kg)  Height 08/13/17 1137 5\' 4"  (1.626 m)     Head Circumference --      Peak Flow --      Pain Score 08/13/17 1144 10     Pain Loc --      Pain Edu? --      Excl. in GC? --     Constitutional: Alert and oriented x4 appears somewhat uncomfortable nontoxic no diaphoresis speaks in full clear sentences Eyes: PERRL EOMI. Head: Atraumatic. Nose: No congestion/rhinnorhea. Mouth/Throat: No trismus Neck: No stridor.   Cardiovascular: Normal rate, regular rhythm. Grossly normal heart sounds.  Good peripheral circulation. Respiratory: Normal respiratory effort.  No  retractions. Lungs CTAB and moving good air Gastrointestinal: Soft nondistended some tenderness in lower abdomen left greater than right with no rebound no guarding no frank peritonitis Musculoskeletal: No lower extremity edema   Neurologic:  Normal speech and language. No gross focal neurologic deficits are appreciated. Skin:  Skin is warm, dry and intact. No rash noted. Psychiatric: Mood and affect are normal. Speech and behavior are normal.    ____________________________________________   DIFFERENTIAL includes but not limited to  Ectopic pregnancy, miscarriage, threatened abortion, ruptured C ____________________________________________   LABS (all labs ordered are listed, but only abnormal results are displayed)  Labs Reviewed  COMPREHENSIVE METABOLIC PANEL - Abnormal; Notable for the following components:      Result Value   Glucose, Bld 124 (*)    All other components within normal limits  HCG, QUANTITATIVE, PREGNANCY - Abnormal; Notable for the following components:   hCG, Beta Chain, Quant, S 1,838 (*)    All other components within normal limits  LIPASE, BLOOD  CBC    Lab work reviewed by me shows decreasing hCG consistent with miscarriage __________________________________________  EKG   ____________________________________________  RADIOLOGY   ____________________________________________   PROCEDURES  Procedure(s) performed: no  .Critical Care Performed by: Merrily Brittle, MD Authorized by: Merrily Brittle, MD   Critical care provider statement:    Critical care time (minutes):  30   Critical care time was exclusive of:  Separately billable procedures and treating other patients   Critical care was necessary to treat or prevent imminent or life-threatening deterioration of the following conditions: Gyn failure.   Critical care was time spent personally by me on the following activities:  Development of treatment plan with patient or surrogate,  discussions with consultants, evaluation of patient's response to treatment, examination of patient, obtaining history from patient or surrogate, ordering and performing treatments and interventions, ordering and review of laboratory studies, ordering and review of radiographic studies, pulse oximetry, re-evaluation of patient's condition and review of old charts    Critical Care performed: yes  Observation: no ____________________________________________   INITIAL IMPRESSION / ASSESSMENT AND PLAN / ED COURSE  Pertinent labs & imaging results that were available during my care of the patient were reviewed by me and considered in my medical decision making (see chart for details).  On arrival the patient is somewhat uncomfortable appearing although not frankly peritonitis.  I will reach out to her OB gynecologist now.    ----------------------------------------- 12:25 PM on 08/13/2017 -----------------------------------------  I spoke with the patient's OB gynecologist Dr. Bonney Aid who feels that the patient may have an ectopic pregnancy, although more likely has a miscarriage.  Regardless, he does recommend a single intramuscular injection of methotrexate followed by follow-up this coming Thursday.  ____________________________________________  ----------------------------------------- 1:16 PM on 08/13/2017 -----------------------------------------  There is no chemotherapy nurse available until at least 230  this afternoon so I myself administered both shots of methotrexate.  Patient tolerated the shots well and she is medically stable for outpatient management verbalizes understanding and agree with the plan.  Strict return precautions have been given  FINAL CLINICAL IMPRESSION(S) / ED DIAGNOSES  Final diagnoses:  Left ovarian pregnancy without intrauterine pregnancy      NEW MEDICATIONS STARTED DURING THIS VISIT:  This SmartLink is deprecated. Use AVSMEDLIST instead to  display the medication list for a patient.   Note:  This document was prepared using Dragon voice recognition software and may include unintentional dictation errors.     Merrily Brittleifenbark, Matina Rodier, MD 08/13/17 1317

## 2017-08-14 ENCOUNTER — Emergency Department: Payer: Medicaid Other

## 2017-08-14 ENCOUNTER — Encounter
Admission: EM | Disposition: A | Discharge status: Home / Self Care | Payer: Self-pay | Source: Home / Self Care | Attending: Emergency Medicine

## 2017-08-14 ENCOUNTER — Encounter: Payer: Self-pay | Admitting: Anesthesiology

## 2017-08-14 ENCOUNTER — Observation Stay
Admission: EM | Admit: 2017-08-14 | Discharge: 2017-08-15 | Disposition: A | Discharge status: Home / Self Care | Payer: Medicaid Other | Attending: Obstetrics and Gynecology | Admitting: Obstetrics and Gynecology

## 2017-08-14 ENCOUNTER — Other Ambulatory Visit: Payer: Self-pay

## 2017-08-14 DIAGNOSIS — O009 Unspecified ectopic pregnancy without intrauterine pregnancy: Principal | ICD-10-CM

## 2017-08-14 DIAGNOSIS — R52 Pain, unspecified: Secondary | ICD-10-CM

## 2017-08-14 DIAGNOSIS — R109 Unspecified abdominal pain: Secondary | ICD-10-CM | POA: Diagnosis present

## 2017-08-14 DIAGNOSIS — O0000 Abdominal pregnancy without intrauterine pregnancy: Secondary | ICD-10-CM

## 2017-08-14 DIAGNOSIS — F17210 Nicotine dependence, cigarettes, uncomplicated: Secondary | ICD-10-CM | POA: Diagnosis not present

## 2017-08-14 LAB — URINE DRUG SCREEN, QUALITATIVE (ARMC ONLY)
Amphetamines, Ur Screen: NOT DETECTED
BARBITURATES, UR SCREEN: NOT DETECTED
BENZODIAZEPINE, UR SCRN: NOT DETECTED
CANNABINOID 50 NG, UR ~~LOC~~: NOT DETECTED
COCAINE METABOLITE, UR ~~LOC~~: NOT DETECTED
MDMA (Ecstasy)Ur Screen: NOT DETECTED
METHADONE SCREEN, URINE: NOT DETECTED
Opiate, Ur Screen: POSITIVE — AB
Phencyclidine (PCP) Ur S: NOT DETECTED
TRICYCLIC, UR SCREEN: NOT DETECTED

## 2017-08-14 LAB — CBC WITH DIFFERENTIAL/PLATELET
BASOS ABS: 0 10*3/uL (ref 0–0.1)
BASOS PCT: 1 %
EOS ABS: 0 10*3/uL (ref 0–0.7)
Eosinophils Relative: 0 %
HEMATOCRIT: 41.2 % (ref 35.0–47.0)
HEMOGLOBIN: 14.2 g/dL (ref 12.0–16.0)
Lymphocytes Relative: 25 %
Lymphs Abs: 2 10*3/uL (ref 1.0–3.6)
MCH: 30.6 pg (ref 26.0–34.0)
MCHC: 34.4 g/dL (ref 32.0–36.0)
MCV: 88.9 fL (ref 80.0–100.0)
MONO ABS: 0.4 10*3/uL (ref 0.2–0.9)
Monocytes Relative: 5 %
NEUTROS ABS: 5.6 10*3/uL (ref 1.4–6.5)
NEUTROS PCT: 69 %
Platelets: 236 10*3/uL (ref 150–440)
RBC: 4.64 MIL/uL (ref 3.80–5.20)
RDW: 13 % (ref 11.5–14.5)
WBC: 8.1 10*3/uL (ref 3.6–11.0)

## 2017-08-14 LAB — COMPREHENSIVE METABOLIC PANEL
ALT: 18 U/L (ref 14–54)
ANION GAP: 7 (ref 5–15)
AST: 22 U/L (ref 15–41)
Albumin: 4.5 g/dL (ref 3.5–5.0)
Alkaline Phosphatase: 47 U/L (ref 38–126)
BILIRUBIN TOTAL: 0.5 mg/dL (ref 0.3–1.2)
BUN: 13 mg/dL (ref 6–20)
CHLORIDE: 103 mmol/L (ref 101–111)
CO2: 25 mmol/L (ref 22–32)
Calcium: 8.9 mg/dL (ref 8.9–10.3)
Creatinine, Ser: 0.74 mg/dL (ref 0.44–1.00)
GFR calc Af Amer: >60 mL/min (ref >60)
Glucose, Bld: 93 mg/dL (ref 65–99)
POTASSIUM: 3.7 mmol/L (ref 3.5–5.1)
Sodium: 135 mmol/L (ref 135–145)
TOTAL PROTEIN: 7 g/dL (ref 6.5–8.1)

## 2017-08-14 LAB — HCG, QUANTITATIVE, PREGNANCY: HCG, BETA CHAIN, QUANT, S: 1031 m[IU]/mL — AB (ref <5)

## 2017-08-14 LAB — PROTIME-INR
INR: 1.08
Prothrombin Time: 13.9 seconds (ref 11.4–15.2)

## 2017-08-14 SURGERY — LAPAROSCOPY, WITH ECTOPIC PREGNANCY SURGICAL TREATMENT
Anesthesia: General

## 2017-08-14 MED ORDER — LACTATED RINGERS IV SOLN: INTRAVENOUS | Status: DC

## 2017-08-14 MED ORDER — HYDROMORPHONE HCL 1 MG/ML IJ SOLN
0.5000 mg | INTRAMUSCULAR | Status: DC | PRN
  Administered 2017-08-15 (×2): 0.5 mg via INTRAVENOUS
  Filled 2017-08-14 (×2): qty 1

## 2017-08-14 MED ORDER — PROPOFOL 10 MG/ML IV BOLUS
INTRAVENOUS | Status: AC
  Filled 2017-08-14: qty 20

## 2017-08-14 MED ORDER — HYDROMORPHONE HCL 1 MG/ML IJ SOLN
0.5000 mg | Freq: Once | INTRAMUSCULAR | Status: AC
  Administered 2017-08-14: 0.5 mg via INTRAVENOUS
  Filled 2017-08-14: qty 1

## 2017-08-14 MED ORDER — NICOTINE 21 MG/24HR TD PT24
MEDICATED_PATCH | TRANSDERMAL | Status: AC
  Filled 2017-08-14: qty 1

## 2017-08-14 MED ORDER — MIDAZOLAM HCL 2 MG/2ML IJ SOLN
INTRAMUSCULAR | Status: AC
  Filled 2017-08-14: qty 2

## 2017-08-14 MED ORDER — SODIUM CHLORIDE 0.9 % IV BOLUS (SEPSIS)
1000.0000 mL | Freq: Once | INTRAVENOUS | Status: AC
  Administered 2017-08-14: 1000 mL via INTRAVENOUS

## 2017-08-14 MED ORDER — BUPIVACAINE HCL (PF) 0.5 % IJ SOLN
INTRAMUSCULAR | Status: AC
  Filled 2017-08-14: qty 30

## 2017-08-14 MED ORDER — LACTATED RINGERS IV SOLN
INTRAVENOUS | Status: DC
  Administered 2017-08-15: 01:00:00 via INTRAVENOUS

## 2017-08-14 MED ORDER — FENTANYL CITRATE (PF) 100 MCG/2ML IJ SOLN
INTRAMUSCULAR | Status: AC
  Filled 2017-08-14: qty 2

## 2017-08-14 MED ORDER — NICOTINE 21 MG/24HR TD PT24
21.0000 mg | MEDICATED_PATCH | Freq: Once | TRANSDERMAL | Status: DC
  Administered 2017-08-14: 21 mg via TRANSDERMAL
  Filled 2017-08-14: qty 1

## 2017-08-14 MED ORDER — FENTANYL CITRATE (PF) 100 MCG/2ML IJ SOLN
75.0000 ug | Freq: Once | INTRAMUSCULAR | Status: AC
  Administered 2017-08-14: 75 ug via INTRAVENOUS
  Filled 2017-08-14: qty 2

## 2017-08-14 SURGICAL SUPPLY — 46 items
ADH SKN CLS APL DERMABOND .7 (GAUZE/BANDAGES/DRESSINGS) ×1
BAG URINE DRAINAGE (UROLOGICAL SUPPLIES) ×3 IMPLANT
BLADE SURG SZ11 CARB STEEL (BLADE) ×3 IMPLANT
CANISTER SUCT 1200ML W/VALVE (MISCELLANEOUS) ×3 IMPLANT
CATH FOLEY 2WAY  5CC 16FR (CATHETERS) ×2
CATH FOLEY 2WAY 5CC 16FR (CATHETERS) ×1
CATH URTH 16FR FL 2W BLN LF (CATHETERS) ×1 IMPLANT
CHLORAPREP W/TINT 26ML (MISCELLANEOUS) ×3 IMPLANT
DERMABOND ADVANCED (GAUZE/BANDAGES/DRESSINGS) ×2
DERMABOND ADVANCED .7 DNX12 (GAUZE/BANDAGES/DRESSINGS) ×1 IMPLANT
DRAPE LEGGINS SURG 28X43 STRL (DRAPES) ×3 IMPLANT
DRAPE SHEET LG 3/4 BI-LAMINATE (DRAPES) ×3 IMPLANT
DRAPE UNDER BUTTOCK W/FLU (DRAPES) ×3 IMPLANT
ELECT REM PT RETURN 9FT ADLT (ELECTROSURGICAL) ×3
ELECTRODE REM PT RTRN 9FT ADLT (ELECTROSURGICAL) ×1 IMPLANT
GLOVE BIO SURGEON STRL SZ7 (GLOVE) ×6 IMPLANT
GLOVE BIOGEL PI IND STRL 7.5 (GLOVE) ×1 IMPLANT
GLOVE BIOGEL PI INDICATOR 7.5 (GLOVE) ×2
GOWN STRL REUS W/ TWL LRG LVL3 (GOWN DISPOSABLE) ×2 IMPLANT
GOWN STRL REUS W/TWL LRG LVL3 (GOWN DISPOSABLE) ×6
IRRIGATION STRYKERFLOW (MISCELLANEOUS) ×1 IMPLANT
IRRIGATOR STRYKERFLOW (MISCELLANEOUS) ×3
IV LACTATED RINGERS 1000ML (IV SOLUTION) ×3 IMPLANT
KIT PINK PAD W/HEAD ARE REST (MISCELLANEOUS) ×3
KIT PINK PAD W/HEAD ARM REST (MISCELLANEOUS) ×1 IMPLANT
KIT RM TURNOVER CYSTO AR (KITS) ×3 IMPLANT
LABEL OR SOLS (LABEL) ×3 IMPLANT
NEEDLE HYPO 22GX1.5 SAFETY (NEEDLE) ×3 IMPLANT
NS IRRIG 500ML POUR BTL (IV SOLUTION) ×3 IMPLANT
PACK LAP CHOLECYSTECTOMY (MISCELLANEOUS) ×3 IMPLANT
PAD OB MATERNITY 4.3X12.25 (PERSONAL CARE ITEMS) ×3 IMPLANT
PAD PREP 24X41 OB/GYN DISP (PERSONAL CARE ITEMS) ×3 IMPLANT
SCISSORS METZENBAUM CVD 33 (INSTRUMENTS) IMPLANT
SHEARS HARMONIC ACE PLUS 36CM (ENDOMECHANICALS) IMPLANT
SLEEVE ENDOPATH XCEL 5M (ENDOMECHANICALS) ×3 IMPLANT
SOL PREP PVP 2OZ (MISCELLANEOUS) ×3
SOLUTION PREP PVP 2OZ (MISCELLANEOUS) ×1 IMPLANT
SURGILUBE 2OZ TUBE FLIPTOP (MISCELLANEOUS) ×3 IMPLANT
SUT MNCRL 4-0 (SUTURE)
SUT MNCRL 4-0 27XMFL (SUTURE)
SUT VIC AB 2-0 UR6 27 (SUTURE) IMPLANT
SUTURE MNCRL 4-0 27XMF (SUTURE) IMPLANT
TROCAR ENDO BLADELESS 11MM (ENDOMECHANICALS) IMPLANT
TROCAR XCEL NON-BLD 5MMX100MML (ENDOMECHANICALS) ×3 IMPLANT
TROCAR XCEL UNIV SLVE 11M 100M (ENDOMECHANICALS) IMPLANT
TUBING INSUFFLATION (TUBING) ×3 IMPLANT

## 2017-08-14 NOTE — ED Notes (Signed)
Pt presents with RUQ and abd pain. Pt was seen here yesterday for a tubal preg. And was tx. Pt is A/ox4 awaiting EDP

## 2017-08-14 NOTE — H&P (Addendum)
Preoperative History and Physical  Alexa Guerra is a 27 y.o. [phone] here for surgical management of ectopic pregnancy.   No significant preoperative concerns.  History of Present Illness: 27 y.o. G3P1011 female who presents for the third time in a week for abdominal and pelvic pain. She has had a confirmed pregnancy of unknown anatomic location. She had a sure LMP of 06/27/17 who had a positive home urine pregnancy test on 08/03/17 with vaginal spotting and diffuse abdominal pain.  Pain has been mostly right-sided, as it is today.  She has been feeling nauseated with dizzyness.  Her G1 pregnancy was a first trimester miscarriage, G2 pregnancy was uncomplicated with vaginal delivery about 4 years ago.  This pregnancy is planned and desired and is with a new partner.  She has had ultrasounds showing concern for ectopic on the left last week. In the clinic she was evaluated and there was a possible intrauterine gestational sac.  Today her pain has been worse than before with heavier bleeding.  She received fentanyl 50 mcg in the ER, which did not touch the pain.  She has had serial hCG measurements that initially dropped, then went back up. Three days ago it was 2,521, yesterday it was 1,838, today it was 1,031.  Of noted, she received methotrexate yesterday for pregnancy of unknown anatomic location, but not a normal pregnancy given the drops in hCG.  Her pain is right-sided, does not radiated. Nothing makes it better or worse.  Associated symptoms, as previously noted.  The pain is sharp and is severe, per her report. Of note, she is rh negative and received rhogam last week.   Proposed surgery: diagnostic laparoscopy, possible right salpingectomy vs salpingostomy.  If pregnancy is noted in left fallopian tube, then the surgery would be left salpingectomy vs salpingostomy (this given the conflicting ultrasound reports).   Past Medical History:  Diagnosis Date  . Double aortic arch   . Double  aortic arch   . Hx of varicella   . Ovarian cyst    Past Surgical History:  Procedure Laterality Date  . NO PAST SURGERIES     OB History  Gravida Para Term Preterm AB Living  4 1 1   1 1   SAB TAB Ectopic Multiple Live Births  1       1    # Outcome Date GA Lbr Len/2nd Weight Sex Delivery Anes PTL Lv  4 Current           3 Term 11/13/12 [redacted]w[redacted]d 13:45 / 01:35 8 lb 0 oz (3.63 kg) F Vag-Spont EPI  LIV  2 SAB 2010          1 Gravida             Patient denies any other pertinent gynecologic issues.   Current Facility-Administered Medications on File Prior to Encounter  Medication Dose Route Frequency Provider Last Rate Last Dose  . methotrexate (50 mg/ml) chemo injection 80 mg  50 mg/m2 Intramuscular Once Vena Austria, MD       Current Outpatient Medications on File Prior to Encounter  Medication Sig Dispense Refill  . HYDROcodone-acetaminophen (NORCO/VICODIN) 5-325 MG tablet Take 1 tablet by mouth every 6 (six) hours as needed. 14 tablet 0  . oxyCODONE-acetaminophen (ROXICET) 5-325 MG tablet Take 1 tablet by mouth every 6 (six) hours as needed for severe pain. 5 tablet 0   Allergies  Allergen Reactions  . Beta Adrenergic Blockers Other (See Comments)    Vitals  dropped "I was pretty much dying"    Social History:   reports that she has been smoking cigarettes.  She has been smoking about 0.25 packs per day. she has never used smokeless tobacco. She reports that she drinks alcohol. She reports that she does not use drugs.  Family History  Problem Relation Age of Onset  . Drug abuse Mother   . Cancer Mother        cervix  . Alcohol abuse Father   . Hypertension Maternal Grandmother   . Diabetes Maternal Grandmother   . Hypertension Maternal Grandfather   . Diabetes Maternal Grandfather   . Hypertension Paternal Grandmother   . Hypertension Paternal Grandfather     Review of Systems: Noncontributory  PHYSICAL EXAM: Blood pressure (!) 99/53, pulse 78, temperature  98.2 F (36.8 C), temperature source Oral, height 5\' 4"  (1.626 m), weight 132 lb (59.9 kg), last menstrual period 06/27/2017, SpO2 100 %. CONSTITUTIONAL: Well-developed, well-nourished female in no acute distress.  HENT:  Normocephalic, atraumatic, External right and left ear normal. Oropharynx is clear and moist EYES: Conjunctivae and EOM are normal. Pupils are equal, round, and reactive to light. No scleral icterus.  NECK: Normal range of motion, supple, no masses SKIN: Skin is warm and dry. No rash noted. Not diaphoretic. No erythema. No pallor. NEUROLGIC: Alert and oriented to person, place, and time. Normal reflexes, muscle tone coordination. No cranial nerve deficit noted. PSYCHIATRIC: Normal mood and affect. Normal behavior. Normal judgment and thought content. CARDIOVASCULAR: Normal heart rate noted, regular rhythm RESPIRATORY: Effort and breath sounds normal, no problems with respiration noted ABDOMEN: Soft, moderately tender to palpation, no rebound or guarding, nondistended. PELVIC: Deferred MUSCULOSKELETAL: Normal range of motion. No edema and no tenderness. 2+ distal pulses.  Labs: Results for orders placed or performed during the hospital encounter of 08/14/17 (from the past 336 hour(s))  hCG, quantitative, pregnancy   Collection Time: 08/14/17  5:15 PM  Result Value Ref Range   hCG, Beta Chain, Quant, S 1,031 (H) <5 mIU/mL  CBC with Differential   Collection Time: 08/14/17  5:19 PM  Result Value Ref Range   WBC 8.1 3.6 - 11.0 K/uL   RBC 4.64 3.80 - 5.20 MIL/uL   Hemoglobin 14.2 12.0 - 16.0 g/dL   HCT 82.941.2 56.235.0 - 13.047.0 %   MCV 88.9 80.0 - 100.0 fL   MCH 30.6 26.0 - 34.0 pg   MCHC 34.4 32.0 - 36.0 g/dL   RDW 86.513.0 78.411.5 - 69.614.5 %   Platelets 236 150 - 440 K/uL   Neutrophils Relative % 69 %   Neutro Abs 5.6 1.4 - 6.5 K/uL   Lymphocytes Relative 25 %   Lymphs Abs 2.0 1.0 - 3.6 K/uL   Monocytes Relative 5 %   Monocytes Absolute 0.4 0.2 - 0.9 K/uL   Eosinophils Relative 0  %   Eosinophils Absolute 0.0 0 - 0.7 K/uL   Basophils Relative 1 %   Basophils Absolute 0.0 0 - 0.1 K/uL  Comprehensive metabolic panel   Collection Time: 08/14/17  5:19 PM  Result Value Ref Range   Sodium 135 135 - 145 mmol/L   Potassium 3.7 3.5 - 5.1 mmol/L   Chloride 103 101 - 111 mmol/L   CO2 25 22 - 32 mmol/L   Glucose, Bld 93 65 - 99 mg/dL   BUN 13 6 - 20 mg/dL   Creatinine, Ser 2.950.74 0.44 - 1.00 mg/dL   Calcium 8.9 8.9 - 28.410.3 mg/dL   Total  Protein 7.0 6.5 - 8.1 g/dL   Albumin 4.5 3.5 - 5.0 g/dL   AST 22 15 - 41 U/L   ALT 18 14 - 54 U/L   Alkaline Phosphatase 47 38 - 126 U/L   Total Bilirubin 0.5 0.3 - 1.2 mg/dL   GFR calc non Af Amer >60 >60 mL/min   GFR calc Af Amer >60 >60 mL/min   Anion gap 7 5 - 15  Protime-INR   Collection Time: 08/14/17  5:19 PM  Result Value Ref Range   Prothrombin Time 13.9 11.4 - 15.2 seconds   INR 1.08   Type and screen [facility]   Collection Time: 08/14/17  5:22 PM  Result Value Ref Range   ABO/RH(D) PENDING    Antibody Screen PENDING    Sample Expiration      08/17/2017 Performed at [facility], [address] [place], [place] [address]   Type and screen Ordered by PROVIDER DEFAULT   Collection Time: 08/14/17  6:15 PM  Result Value Ref Range   ABO/RH(D) O NEG    Antibody Screen POS    Sample Expiration 08/17/2017    Antibody Identification PASSIVELY ACQUIRED ANTI-D    Unit Number [id number]    Blood Component Type RBC LR PHER2    Unit division 00    Status of Unit ALLOCATED    Transfusion Status OK TO TRANSFUSE    Crossmatch Result      COMPATIBLE Performed at [facility], [address] [place], [place] [address]    Unit Number [id number]    Blood Component Type RED CELLS,LR    Unit division 00    Status of Unit ALLOCATED    Transfusion Status OK TO TRANSFUSE    Crossmatch Result COMPATIBLE   BPAM RBC   Collection Time: 08/14/17  6:15 PM  Result Value Ref Range    Blood Product Unit Number [id number]    Unit Type and Rh 9500    Blood Product Expiration Date 956387564332    Blood Product Unit Number [id number]    Unit Type and Rh 9500    Blood Product Expiration Date 160109323557   Results for orders placed or performed during the hospital encounter of 08/13/17 (from the past 336 hour(s))  Lipase, blood   Collection Time: 08/13/17 11:42 AM  Result Value Ref Range   Lipase 34 11 - 51 U/L  Comprehensive metabolic panel   Collection Time: 08/13/17 11:42 AM  Result Value Ref Range   Sodium 136 135 - 145 mmol/L   Potassium 4.5 3.5 - 5.1 mmol/L   Chloride 104 101 - 111 mmol/L   CO2 26 22 - 32 mmol/L   Glucose, Bld 124 (H) 65 - 99 mg/dL   BUN 10 6 - 20 mg/dL   Creatinine, Ser 3.22 0.44 - 1.00 mg/dL   Calcium 9.0 8.9 - 02.5 mg/dL   Total Protein 6.7 6.5 - 8.1 g/dL   Albumin 4.3 3.5 - 5.0 g/dL   AST 21 15 - 41 U/L   ALT 17 14 - 54 U/L   Alkaline Phosphatase 44 38 - 126 U/L   Total Bilirubin 0.6 0.3 - 1.2 mg/dL   GFR calc non Af Amer >60 >60 mL/min   GFR calc Af Amer >60 >60 mL/min   Anion gap 6 5 - 15  CBC   Collection Time: 08/13/17 11:42 AM  Result Value Ref Range   WBC 6.3 3.6 - 11.0 K/uL   RBC 4.47 3.80 -  5.20 MIL/uL   Hemoglobin 13.6 12.0 - 16.0 g/dL   HCT 40.9 81.1 - 91.4 %   MCV 89.7 80.0 - 100.0 fL   MCH 30.4 26.0 - 34.0 pg   MCHC 33.8 32.0 - 36.0 g/dL   RDW 78.2 95.6 - 21.3 %   Platelets 217 150 - 440 K/uL  hCG, quantitative, pregnancy   Collection Time: 08/13/17 11:42 AM  Result Value Ref Range   hCG, Beta Chain, Quant, S 1,838 (H) <5 mIU/mL  Results for orders placed or performed during the hospital encounter of 08/11/17 (from the past 336 hour(s))  B-HCG Quant   Collection Time: 08/11/17  1:43 PM  Result Value Ref Range   hCG, Beta Chain, Quant, S 2,521 (H) <5 mIU/mL  Results for orders placed or performed in visit on 08/10/17 (from the past 336 hour(s))  Beta HCG, Quant   Collection Time: 08/10/17 10:40 AM    Result Value Ref Range   hCG Quant CANCELED mIU/mL  Beta HCG, Quant   Collection Time: 08/10/17  2:01 PM  Result Value Ref Range   hCG Quant 2,218 mIU/mL  Results for orders placed or performed during the hospital encounter of 08/09/17 (from the past 336 hour(s))  hCG, quantitative, pregnancy   Collection Time: 08/09/17  1:06 PM  Result Value Ref Range   hCG, Beta Chain, Quant, S 2,593 (H) <5 mIU/mL  CBC   Collection Time: 08/09/17  1:06 PM  Result Value Ref Range   WBC 7.7 3.6 - 11.0 K/uL   RBC 4.59 3.80 - 5.20 MIL/uL   Hemoglobin 14.1 12.0 - 16.0 g/dL   HCT 08.6 57.8 - 46.9 %   MCV 89.5 80.0 - 100.0 fL   MCH 30.6 26.0 - 34.0 pg   MCHC 34.2 32.0 - 36.0 g/dL   RDW 62.9 52.8 - 41.3 %   Platelets 237 150 - 440 K/uL  Basic metabolic panel   Collection Time: 08/09/17  1:06 PM  Result Value Ref Range   Sodium 135 135 - 145 mmol/L   Potassium 3.6 3.5 - 5.1 mmol/L   Chloride 104 101 - 111 mmol/L   CO2 25 22 - 32 mmol/L   Glucose, Bld 96 65 - 99 mg/dL   BUN 9 6 - 20 mg/dL   Creatinine, Ser 2.44 0.44 - 1.00 mg/dL   Calcium 8.8 (L) 8.9 - 10.3 mg/dL   GFR calc non Af Amer >60 >60 mL/min   GFR calc Af Amer >60 >60 mL/min   Anion gap 6 5 - 15  Hepatic function panel   Collection Time: 08/09/17  1:06 PM  Result Value Ref Range   Total Protein 6.8 6.5 - 8.1 g/dL   Albumin 4.3 3.5 - 5.0 g/dL   AST 20 15 - 41 U/L   ALT 16 14 - 54 U/L   Alkaline Phosphatase 47 38 - 126 U/L   Total Bilirubin 0.7 0.3 - 1.2 mg/dL   Bilirubin, Direct 0.1 0.1 - 0.5 mg/dL   Indirect Bilirubin 0.6 0.3 - 0.9 mg/dL  Type and screen [facility]   Collection Time: 08/09/17  1:06 PM  Result Value Ref Range   ABO/RH(D) O NEG    Antibody Screen NEG    Sample Expiration      08/12/2017 Performed at [facility], [address] [place], [place] [address]   Pregnancy, urine POC   Collection Time: 08/09/17  1:12 PM  Result Value Ref Range   Preg Test,  Ur POSITIVE (A) NEGATIVE   Rhogam injection   Collection Time: 08/09/17  4:06 PM  Result Value Ref Range   Unit Number [id number]    Blood Component Type RHIG    Unit division 00    Status of Unit ISSUED,FINAL    Transfusion Status      OK TO TRANSFUSE Performed at [facility], [address] [place], [place] [address]   Progesterone   Collection Time: 08/09/17  6:48 PM  Result Value Ref Range   Progesterone 9.9 ng/mL    Imaging Studies: US Ob Comp < 14 Wks  Result Date: 08/14/2017 CLINICAL DATA:  Worsening pain. Methotrexate treatment yesterday for ectopic pregnancy. EXAM: OBSTETRIC <14 WK [place] AND TRANSVAGINAL OB US TECHNIQUE: Both transabdominal and transvaginal ultrasound examinations were performed for complete evaluation of the gestation as well as the maternal uterus, adnexal regions, and pelvic cul-de-sac. Transvaginal technique was performed to assess early pregnancy. COMPARISON:  08/14/2017. FINDINGS: Intrauterine gestational sac: No intrauterine gestational sac Yolk sac: No intrauterine yolk sac Embryo:  None visualized Cardiac Activity: None visualized Subchorionic hemorrhage:  None visualized. Maternal uterus/adnexae: Right adnexal 2.5 x 2.0 x 2.1 cm complex mass which appears to contain a gestational sac and yolk sac. Findings suggest ectopic pregnancy. Gestational sac diameter 6 mm. 2.1 x 1.7 x 1.8 cm left ovarian complex cyst. Findings suggest corpus luteal cyst. Moderate free fluid in the pelvis. IMPRESSION: 1. 2.5 x 2.0 x 2.1 cm complex right adnexal mass and what appears to be a gestational sac and yolk sac within this complex mass. Findings most consistent with right ectopic pregnancy. Moderate free pelvic fluid noted. 2. 2.1 x 1.7 x 1.8 cm left ovarian complex cyst again noted. Again although this could represent an ectopic pregnancy given the possibility of intrauterine gestational sac and yolk sac this complex cyst may represent a corpus luteal cyst. Moderate free pelvic fluid. Close  follow-up exams suggested. Critical Value/emergent results were called by telephone at the time of interpretation on 08/14/2017 at 4:57 pm to Dr. Roxan Hockey, who verbally acknowledged these results. Electronically Signed   By: Maisie Fus  Register   On: 08/14/2017 17:01   US Ob Comp Less 14 Wks  Result Date: 08/09/2017 CLINICAL DATA:  First-trimester pregnancy with vaginal bleeding for 1 hour. EXAM: OBSTETRIC <14 WK [place] AND TRANSVAGINAL OB US TECHNIQUE: Both transabdominal and transvaginal ultrasound examinations were performed for complete evaluation of the gestation as well as the maternal uterus, adnexal regions, and pelvic cul-de-sac. Transvaginal technique was performed to assess early pregnancy. COMPARISON:  None. FINDINGS: No intrauterine gestational sac or cyst is seen. Reportedly the patient has had minimal spotting and presented for is diffuse pelvic pain which is increasing. In the left adnexa is a complex cystic mass measuring up to 2.1 cm. A discrete left ovary is not identified to determine if this is intra- or extraovarian. Cyst/ follicle in the right ovary measuring up to 27 mm. Pelvic fluid that is small volume simple appearing. These results were called by telephone at the time of interpretation on 08/09/2017 at 4:57 pm to Dr. Minna Antis , who verbally acknowledged these results. IMPRESSION: 1. Complex cystic mass in the left adnexa measuring up to 2.1 cm, a discrete left ovary is not identified to determine if this is intra- or extra-ovarian. Given beta and lack of intrauterine gestational sac, findings are primarily concerning for a left ectopic pregnancy. 2. Small volume simple appearing pelvic fluid. Electronically Signed   By: Kathrynn Ducking.D.  On: 08/09/2017 16:59   US Ob Transvaginal  Result Date: 08/14/2017 CLINICAL DATA:  Worsening pain. Methotrexate treatment yesterday for ectopic pregnancy. EXAM: OBSTETRIC <14 WK [place] AND TRANSVAGINAL OB US TECHNIQUE: Both transabdominal and  transvaginal ultrasound examinations were performed for complete evaluation of the gestation as well as the maternal uterus, adnexal regions, and pelvic cul-de-sac. Transvaginal technique was performed to assess early pregnancy. COMPARISON:  08/14/2017. FINDINGS: Intrauterine gestational sac: No intrauterine gestational sac Yolk sac: No intrauterine yolk sac Embryo:  None visualized Cardiac Activity: None visualized Subchorionic hemorrhage:  None visualized. Maternal uterus/adnexae: Right adnexal 2.5 x 2.0 x 2.1 cm complex mass which appears to contain a gestational sac and yolk sac. Findings suggest ectopic pregnancy. Gestational sac diameter 6 mm. 2.1 x 1.7 x 1.8 cm left ovarian complex cyst. Findings suggest corpus luteal cyst. Moderate free fluid in the pelvis. IMPRESSION: 1. 2.5 x 2.0 x 2.1 cm complex right adnexal mass and what appears to be a gestational sac and yolk sac within this complex mass. Findings most consistent with right ectopic pregnancy. Moderate free pelvic fluid noted. 2. 2.1 x 1.7 x 1.8 cm left ovarian complex cyst again noted. Again although this could represent an ectopic pregnancy given the possibility of intrauterine gestational sac and yolk sac this complex cyst may represent a corpus luteal cyst. Moderate free pelvic fluid. Close follow-up exams suggested. Critical Value/emergent results were called by telephone at the time of interpretation on 08/14/2017 at 4:57 pm to Dr. Roxan Hockey, who verbally acknowledged these results. Electronically Signed   By: Maisie Fus  Register   On: 08/14/2017 17:01   US Ob Transvaginal  Result Date: 08/09/2017 CLINICAL DATA:  First-trimester pregnancy with vaginal bleeding for 1 hour. EXAM: OBSTETRIC <14 WK [place] AND TRANSVAGINAL OB US TECHNIQUE: Both transabdominal and transvaginal ultrasound examinations were performed for complete evaluation of the gestation as well as the maternal uterus, adnexal regions, and pelvic cul-de-sac. Transvaginal technique was  performed to assess early pregnancy. COMPARISON:  None. FINDINGS: No intrauterine gestational sac or cyst is seen. Reportedly the patient has had minimal spotting and presented for is diffuse pelvic pain which is increasing. In the left adnexa is a complex cystic mass measuring up to 2.1 cm. A discrete left ovary is not identified to determine if this is intra- or extraovarian. Cyst/ follicle in the right ovary measuring up to 27 mm. Pelvic fluid that is small volume simple appearing. These results were called by telephone at the time of interpretation on 08/09/2017 at 4:57 pm to Dr. Minna Antis , who verbally acknowledged these results. IMPRESSION: 1. Complex cystic mass in the left adnexa measuring up to 2.1 cm, a discrete left ovary is not identified to determine if this is intra- or extra-ovarian. Given beta and lack of intrauterine gestational sac, findings are primarily concerning for a left ectopic pregnancy. 2. Small volume simple appearing pelvic fluid. Electronically Signed   By: Marnee Spring M.D.   On: 08/09/2017 16:59    Assessment: Ectopic pregnancy  Plan: Patient will undergo surgical management with the above-noted surgery.   The risks of surgery were discussed in detail with the patient including but not limited to: bleeding which may require transfusion or reoperation; infection which may require antibiotics; injury to surrounding organs which may involve bowel, bladder, ureters ; need for additional procedures including laparoscopy or laparotomy; thromboembolic phenomenon, surgical site problems and other postoperative/anesthesia complications. Likelihood of success in alleviating the patient's condition was discussed. Routine postoperative instructions will be reviewed with  the patient.  The patient concurred with the proposed plan, giving informed written consent for the surgery.  Anesthesia and OR aware.  Preoperative prophylactic antibiotics and SCDs ordered on call to the OR.  To  OR when ready.  Of note, the patient was offered observation given her drop in hCG and recent methotrexate administration.  However, her pain is worse, she has new, concerning findings on ultrasound with moderate free fluid noted in her pelvis.  Per the ER physician, her blood pressures have been a little lower than is normal for her (she has not been tachycardic), and she has received some IVF resuscitation in the ER. Given all the above, mutual decision made for surgery in this setting.   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  She has not had this malformation evaluated in about 5 years.  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  There is concern this could cause difficulty and danger with intubation.  I have re-assessed the patient and discussed with her that the alternative to surgery would be very close observation overnight.  She is currently hemodynamically stable and her pain is better controlled with dilaudid.  Will monitor closely and repeat labs in the AM.  To OR, if her situation becomes emergent and she is hemodynamically stable.

## 2017-08-14 NOTE — ED Notes (Signed)
Pt has a positive antibodies screen this RN was notified by blood bank.

## 2017-08-14 NOTE — OR Nursing (Signed)
Per Dr. Jean RosenthalJackson case has been cancelled and MD will admit for observations d/t concerns anatomically with pt. This RN notified additional services including PACU staff and call Nurse,CRNA, ED RN.

## 2017-08-14 NOTE — ED Notes (Signed)
Urine sent at this time.

## 2017-08-14 NOTE — ED Notes (Signed)
Pt 2nd type and screen sent at this time.

## 2017-08-14 NOTE — ED Notes (Signed)
[facility]

## 2017-08-14 NOTE — ED Notes (Signed)
Attempted to call report

## 2017-08-14 NOTE — ED Provider Notes (Addendum)
[facility] Emergency Department Provider Note  ____________________________________________   I have reviewed the triage vital signs and the nursing notes. Where available I have reviewed prior notes and, if possible and indicated, outside hospital notes.    HISTORY  Chief Complaint Abdominal Pain    HPI Alexa Guerra is a 27 y.o. female  3 P1, had a prior miscarriage, presents today complaining of ongoing worsening right-sided lower abdominal pain which is been there for at least a week she believes.  Ongoing vaginal bleeding as well, approximately pad an hour.  She denies any fever or chills he does feel somewhat lightheaded.  She has been worked up for possible ectopic pregnancy with an ultrasound which showed a cystic structure on the left and no IUP done on the third of this month she received methotrexate yesterday, she states the pain is gotten gradually worse over the last day or so.  She denies any vomiting or passage of tissue. The pain is sharp, nonradiating, makes it better nothing makes it worse.   Past Medical History:  Diagnosis Date  . Double aortic arch   . Double aortic arch   . Hx of varicella   . Ovarian cyst     There are no active problems to display for this patient.   Past Surgical History:  Procedure Laterality Date  . NO PAST SURGERIES      Prior to Admission medications   Medication Sig Start Date End Date Taking? Authorizing Provider  HYDROcodone-acetaminophen (NORCO/VICODIN) 5-325 MG tablet Take 1 tablet by mouth every 6 (six) hours as needed. 08/10/17   Vena AustriaStaebler, Andreas, MD  oxyCODONE-acetaminophen (ROXICET) 5-325 MG tablet Take 1 tablet by mouth every 6 (six) hours as needed for severe pain. 08/13/17   Merrily Brittleifenbark, Neil, MD    Allergies Beta adrenergic blockers  Family History  Problem Relation Age of Onset  . Drug abuse Mother   . Cancer Mother        cervix  . Alcohol abuse Father   . Hypertension Maternal  Grandmother   . Diabetes Maternal Grandmother   . Hypertension Maternal Grandfather   . Diabetes Maternal Grandfather   . Hypertension Paternal Grandmother   . Hypertension Paternal Grandfather     Social History Social History   Tobacco Use  . Smoking status: Current Every Day Smoker    Packs/day: 0.25    Types: Cigarettes  . Smokeless tobacco: Never Used  Substance Use Topics  . Alcohol use: Yes    Comment: Occasionally  . Drug use: No    Review of Systems Constitutional: No fever/chills Eyes: No visual changes. ENT: No sore throat. No stiff neck no neck pain Cardiovascular: Denies chest pain. Respiratory: Denies shortness of breath. Gastrointestinal:   no vomiting.  No diarrhea.  No constipation. Genitourinary: Negative for dysuria. Musculoskeletal: Negative lower extremity swelling Skin: Negative for rash. Neurological: Negative for severe headaches, focal weakness or numbness.   ____________________________________________   PHYSICAL EXAM:  VITAL SIGNS: ED Triage Vitals [08/14/17 1523]  Enc Vitals Group     BP (!) 105/58     Pulse Rate 97     Resp      Temp 98.2 F (36.8 C)     Temp Source Oral     SpO2 99 %     Weight 132 lb (59.9 kg)     Height 5\' 4"  (1.626 m)     Head Circumference      Peak Flow  Pain Score 10     Pain Loc      Pain Edu?      Excl. in GC?     Constitutional: Alert and oriented. Well appearing and in no acute distress. Eyes: Conjunctivae are normal Head: Atraumatic HEENT: No congestion/rhinnorhea. Mucous membranes are moist.  Oropharynx non-erythematous Neck:   Nontender with no meningismus, no masses, no stridor Cardiovascular: Normal rate, regular rhythm. Grossly normal heart sounds.  Good peripheral circulation. Respiratory: Normal respiratory effort.  No retractions. Lungs CTAB. Abdominal: Soft and there is some right lower quadrant tenderness, no guarding or rebound, soft nonsurgical abdomen. No distention. No  guarding no rebound Back:  There is no focal tenderness or step off.  there is no midline tenderness there are no lesions noted. there is no CVA tenderness  Musculoskeletal: No lower extremity tenderness, no upper extremity tenderness. No joint effusions, no DVT signs strong distal pulses no edema Neurologic:  Normal speech and language. No gross focal neurologic deficits are appreciated.  Skin:  Skin is warm, dry and intact. No rash noted. Psychiatric: Mood and affect are normal. Speech and behavior are normal.  ____________________________________________   LABS (all labs ordered are listed, but only abnormal results are displayed)  Labs Reviewed  CBC WITH DIFFERENTIAL/PLATELET  COMPREHENSIVE METABOLIC PANEL  HCG, QUANTITATIVE, PREGNANCY  PROTIME-INR  TYPE AND SCREEN    Pertinent labs  results that were available during my care of the patient were reviewed by me and considered in my medical decision making (see chart for details). ____________________________________________  EKG  I personally interpreted any EKGs ordered by me or triage  ____________________________________________  RADIOLOGY  Pertinent labs & imaging results that were available during my care of the patient were reviewed by me and considered in my medical decision making (see chart for details). If possible, patient and/or family made aware of any abnormal findings.  US Ob Comp < 14 Wks  Result Date: 08/14/2017 CLINICAL DATA:  Worsening pain. Methotrexate treatment yesterday for ectopic pregnancy. EXAM: OBSTETRIC <14 WK [place] AND TRANSVAGINAL OB US TECHNIQUE: Both transabdominal and transvaginal ultrasound examinations were performed for complete evaluation of the gestation as well as the maternal uterus, adnexal regions, and pelvic cul-de-sac. Transvaginal technique was performed to assess early pregnancy. COMPARISON:  08/14/2017. FINDINGS: Intrauterine gestational sac: No intrauterine gestational sac Yolk sac:  No intrauterine yolk sac Embryo:  None visualized Cardiac Activity: None visualized Subchorionic hemorrhage:  None visualized. Maternal uterus/adnexae: Right adnexal 2.5 x 2.0 x 2.1 cm complex mass which appears to contain a gestational sac and yolk sac. Findings suggest ectopic pregnancy. Gestational sac diameter 6 mm. 2.1 x 1.7 x 1.8 cm left ovarian complex cyst. Findings suggest corpus luteal cyst. Moderate free fluid in the pelvis. IMPRESSION: 1. 2.5 x 2.0 x 2.1 cm complex right adnexal mass and what appears to be a gestational sac and yolk sac within this complex mass. Findings most consistent with right ectopic pregnancy. Moderate free pelvic fluid noted. 2. 2.1 x 1.7 x 1.8 cm left ovarian complex cyst again noted. Again although this could represent an ectopic pregnancy given the possibility of intrauterine gestational sac and yolk sac this complex cyst may represent a corpus luteal cyst. Moderate free pelvic fluid. Close follow-up exams suggested. Critical Value/emergent results were called by telephone at the time of interpretation on 08/14/2017 at 4:57 pm to Dr. Roxan Hockey, who verbally acknowledged these results. Electronically Signed   By: Maisie Fus  Register   On: 08/14/2017 17:01   US Ob Transvaginal  Result Date: 08/14/2017 CLINICAL DATA:  Worsening pain. Methotrexate treatment yesterday for ectopic pregnancy. EXAM: OBSTETRIC <14 WK [place] AND TRANSVAGINAL OB US TECHNIQUE: Both transabdominal and transvaginal ultrasound examinations were performed for complete evaluation of the gestation as well as the maternal uterus, adnexal regions, and pelvic cul-de-sac. Transvaginal technique was performed to assess early pregnancy. COMPARISON:  08/14/2017. FINDINGS: Intrauterine gestational sac: No intrauterine gestational sac Yolk sac: No intrauterine yolk sac Embryo:  None visualized Cardiac Activity: None visualized Subchorionic hemorrhage:  None visualized. Maternal uterus/adnexae: Right adnexal 2.5 x 2.0 x 2.1 cm  complex mass which appears to contain a gestational sac and yolk sac. Findings suggest ectopic pregnancy. Gestational sac diameter 6 mm. 2.1 x 1.7 x 1.8 cm left ovarian complex cyst. Findings suggest corpus luteal cyst. Moderate free fluid in the pelvis. IMPRESSION: 1. 2.5 x 2.0 x 2.1 cm complex right adnexal mass and what appears to be a gestational sac and yolk sac within this complex mass. Findings most consistent with right ectopic pregnancy. Moderate free pelvic fluid noted. 2. 2.1 x 1.7 x 1.8 cm left ovarian complex cyst again noted. Again although this could represent an ectopic pregnancy given the possibility of intrauterine gestational sac and yolk sac this complex cyst may represent a corpus luteal cyst. Moderate free pelvic fluid. Close follow-up exams suggested. Critical Value/emergent results were called by telephone at the time of interpretation on 08/14/2017 at 4:57 pm to Dr. Roxan Hockey, who verbally acknowledged these results. Electronically Signed   By: Maisie Fus  Register   On: 08/14/2017 17:01   ____________________________________________    PROCEDURES  Procedure(s) performed: None  Procedures  Critical Care performed: None  ____________________________________________   INITIAL IMPRESSION / ASSESSMENT AND PLAN / ED COURSE  Pertinent labs & imaging results that were available during my care of the patient were reviewed by me and considered in my medical decision making (see chart for details).  ----------------------------------------- 5:19 PM on 08/14/2017 -----------------------------------------  Paging dr. Bonney Aid at this time.  ----------------------------------------- 5:25 PM on 08/14/2017 -----------------------------------------  Dr. Bonney Aid is in the operating room he and I discussed the patient's care vitals etc. he will come evaluate the patient he understands ultrasound findings  ----------------------------------------- 7:13 PM on  08/14/2017 -----------------------------------------  Patient does have continuing pain which she has had, we have given her fentanyl pressures are holding roughly steady, heart rate is reassuring, blood work is reassuring, Sharene Butters is going down, Rh is negative but that is known.  She already had RhoGam this month.  Awaiting OB/GYN input. Repaging.  ----------------------------------------- 7:27 PM on 08/14/2017 -----------------------------------------  Dr. Jean Rosenthal is aware of pt, was in a complicated case. Agrees w mgt thus far and will come see pt. Appreciate consult.  ----------------------------------------- 9:58 PM on 08/14/2017 -----------------------------------------  Was seen and evaluated by OB/GYN they will take to the operating room she has been consented however, at this time patient is threatening to leave AMA unless she is allowed to go smoke cigarettes.  She has IV, she is not allowed to smoke in hospital grounds etc., I am giving her a nicotine patch.  She is also demanding Dilaudid, she is in no acute distress at this time.  We will give her pain medication, if available on her I hope to stay and not leave AGAINST MEDICAL ADVICE with an ectopic.    ____________________________________________   FINAL CLINICAL IMPRESSION(S) / ED DIAGNOSES  Final diagnoses:  Pain  Ectopic pregnancy      This chart was dictated using voice recognition software.  Despite best efforts to proofread,  errors can occur which can change meaning.      Jeanmarie Plant, MD 08/14/17 1914    Jeanmarie Plant, MD 08/14/17 Merton Border, MD 08/14/17 Serena Croissant    Jeanmarie Plant, MD 08/14/17 2159

## 2017-08-14 NOTE — ED Triage Notes (Signed)
Pt states that she is having RLQ consistently for a week. Pt had methotrexate injection yesterday due to "abnormal pregnancy". Vaginal bleeding has been ongoing. Pt alert and oriented X4, active, cooperative, pt in NAD. RR even and unlabored, color WNL.

## 2017-08-14 NOTE — ED Notes (Signed)
Pt request emergency contact Alexa CumminsJesse [phone] or cell [phone] be called after surgery.

## 2017-08-14 NOTE — ED Notes (Signed)
This RN [place] and that the pt is now observation. This RN will notify EDP.

## 2017-08-14 NOTE — ED Notes (Signed)
Once again attempted to call report

## 2017-08-15 ENCOUNTER — Telehealth: Payer: Self-pay

## 2017-08-15 DIAGNOSIS — O009 Unspecified ectopic pregnancy without intrauterine pregnancy: Secondary | ICD-10-CM | POA: Diagnosis not present

## 2017-08-15 LAB — CBC
HCT: 34.8 % — ABNORMAL LOW (ref 35.0–47.0)
Hemoglobin: 11.8 g/dL — ABNORMAL LOW (ref 12.0–16.0)
MCH: 30.6 pg (ref 26.0–34.0)
MCHC: 33.9 g/dL (ref 32.0–36.0)
MCV: 90.1 fL (ref 80.0–100.0)
PLATELETS: 189 10*3/uL (ref 150–440)
RBC: 3.86 MIL/uL (ref 3.80–5.20)
RDW: 13.2 % (ref 11.5–14.5)
WBC: 5.6 10*3/uL (ref 3.6–11.0)

## 2017-08-15 LAB — HCG, QUANTITATIVE, PREGNANCY: HCG, BETA CHAIN, QUANT, S: 738 m[IU]/mL — AB (ref <5)

## 2017-08-15 MED ORDER — OXYCODONE-ACETAMINOPHEN 5-325 MG PO TABS: 1.0000 | ORAL_TABLET | Freq: Four times a day (QID) | ORAL | 0 refills | Status: AC | PRN

## 2017-08-15 NOTE — Progress Notes (Signed)
Discharge instructions reviewed with patient at bedside. Questioned answered. IV removed without complications. Patient's follow up appointment reviewed.

## 2017-08-15 NOTE — Discharge Summary (Signed)
DC Summary Discharge Summary   Patient ID: Alexa Guerra [id number] 27 y.o. 20-Jan-1991  Admit date: 08/14/2017  Discharge date: 08/15/2017  Principal Diagnoses:  Ectopic pregnancy  Secondary Diagnoses:  none  Procedures performed during the hospitalization:  Monitoring for hemodynamic stability with ectopic pregnancy.  HPI: 27 y.o. [id number] female presenting for increased abdominal pain and increased vaginal bleeding in the setting of ectopic pregnancy. She had received methotrexate the day prior to presenting to the ER.    Past Medical History:  Diagnosis Date  . Double aortic arch   . Double aortic arch   . Hx of varicella   . Ovarian cyst     Past Surgical History:  Procedure Laterality Date  . NO PAST SURGERIES      Allergies  Allergen Reactions  . Beta Adrenergic Blockers Other (See Comments)    Vitals dropped "I was pretty much dying"    Social History   Tobacco Use  . Smoking status: Current Every Day Smoker    Packs/day: 0.25    Types: Cigarettes  . Smokeless tobacco: Never Used  Substance Use Topics  . Alcohol use: Yes    Comment: Occasionally  . Drug use: No    Family History  Problem Relation Age of Onset  . Drug abuse Mother   . Cancer Mother        cervix  . Alcohol abuse Father   . Hypertension Maternal Grandmother   . Diabetes Maternal Grandmother   . Hypertension Maternal Grandfather   . Diabetes Maternal Grandfather   . Hypertension Paternal Grandmother   . Hypertension Paternal [facility] Course:  Admitted initially with plan to go to the OR to assess need for removal of ectopic. 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 She was hemodynamically stable throughout her stay and was greatly desirous of discharge.  She did have a drop in hemoglobin of 2 points. However, she reported decrease in bleeding and pain throughout her  hospital stay with normal vitals throughout.  She denied feeling dizzy, lightheaded. She has follow up labs tomorrow and agreed to very strict precautions (of feeling faint, dizzy, short of breath, chest pain, increased abdominal pain, increased vaginal bleeding).  She was discharged in stable condition with close follow up.  Discharge Exam: BP (!) 94/42 (BP Location: Right Arm) Comment: nurse Luvenia Redden notified  Pulse 66   Temp 98 F (36.7 C) (Oral)   Resp 18   Ht 5\' 4"  (1.626 m)   Wt 132 lb (59.9 kg)   LMP 06/27/2017   SpO2 100%   BMI 22.66 kg/m  General  no apparent distress   CV  RRR   Pulmonary  clear to ausculatation bllaterally   Abdomen  S/NT/ND, +BS, no rebound or guarding  Extremities  no edema, symmetric, SCDs in place    Condition at Discharge: Stable  Complications affecting treatment: None  Discharge Medications:  Allergies as of 08/15/2017      Reactions   Beta Adrenergic Blockers Other (See Comments)   Vitals dropped "I was pretty much dying"      Medication List    STOP taking these medications   HYDROcodone-acetaminophen 5-325 MG tablet Commonly known as:  NORCO/VICODIN     TAKE these medications   oxyCODONE-acetaminophen 5-325 MG tablet Commonly known as:  ROXICET Take 1 tablet by mouth every 6 (six) hours as needed for up to  3 days for severe pain.      Follow-up arrangements:  Follow-up Information    Conard NovakJackson, Barby Colvard D, MD. Schedule an appointment as soon as possible for a visit on 08/17/2017.   Specialty:  Obstetrics and Gynecology Why:  Follow up abdominal pain Contact information: [address] [place] [address] [phone]           Discharge Disposition: Home to self care  Signed: Thomasene MohairStephen Lequisha Cammack, MD 08/15/2017 9:32 AM

## 2017-08-15 NOTE — Progress Notes (Signed)
Pt with pain 8/10. Notified MD. No new orders at this time. Will continue to monitor and assess.

## 2017-08-15 NOTE — Telephone Encounter (Signed)
Pt states SDJ asked for her to call to request a work note for her job at [phone] & the dates need to be between the 8th & the 13th. [phone]

## 2017-08-16 LAB — TYPE AND SCREEN
ABO/RH(D): O NEG
ANTIBODY SCREEN: POSITIVE
Unit division: 0
Unit division: 0

## 2017-08-16 LAB — HIV ANTIBODY (ROUTINE TESTING W REFLEX): HIV Screen 4th Generation wRfx: NONREACTIVE

## 2017-08-16 LAB — BPAM RBC
Blood Product Expiration Date: 201902092359
Blood Product Expiration Date: 201902092359
Unit Type and Rh: 9500
Unit Type and Rh: 9500

## 2017-08-16 NOTE — Telephone Encounter (Signed)
Please advise on specific work restrictions. Does it just need to state that pt will be out?

## 2017-08-16 NOTE — Telephone Encounter (Signed)
Patient to be out of work until at least through Monday, depending on how things go.

## 2017-08-16 NOTE — Telephone Encounter (Signed)
done

## 2017-08-17 ENCOUNTER — Ambulatory Visit: Payer: Medicaid Other | Admitting: Obstetrics and Gynecology

## 2017-08-17 ENCOUNTER — Encounter: Payer: Self-pay | Admitting: Obstetrics and Gynecology

## 2017-08-17 VITALS — BP 100/70 | HR 96 | Ht 64.0 in | Wt 136.0 lb

## 2017-08-17 DIAGNOSIS — O009 Unspecified ectopic pregnancy without intrauterine pregnancy: Secondary | ICD-10-CM | POA: Diagnosis not present

## 2017-08-17 LAB — CBC
HEMOGLOBIN: 12.6 g/dL (ref 11.1–15.9)
Hematocrit: 37 % (ref 34.0–46.6)
MCH: 30.9 pg (ref 26.6–33.0)
MCHC: 34.1 g/dL (ref 31.5–35.7)
MCV: 91 fL (ref 79–97)
PLATELETS: 227 10*3/uL (ref 150–379)
RBC: 4.08 x10E6/uL (ref 3.77–5.28)
RDW: 13.6 % (ref 12.3–15.4)
WBC: 7.3 10*3/uL (ref 3.4–10.8)

## 2017-08-17 LAB — BETA HCG QUANT (REF LAB): hCG Quant: 413 m[IU]/mL

## 2017-08-17 NOTE — Progress Notes (Signed)
Obstetrics & Gynecology Office Visit   Chief Complaint  Patient presents with  . Follow-up    tubal preg; ovarian cyst; bleeding c clots; dizziness, a lot of pain    History of Present Illness: 27 y.o. [id number] female presenting in follow up for ectopic pregnancy of unknown anatomic location.  She was admitted overnight to the hospital 3 days ago for monitoring of symptoms.  She had severe right lower quadrant pain. Ultrasound showed signs suspicious for ectopic pregnancy on the right side. However, she has already received methotrexate and has had a significant drop in her quantitative hCG since that time.  She continues to have pain without much relief from any pain medication except for IV dilaudid (fentanyl didn't help while in the ED).  She states that she continues to have bleeding, passing a golf-ball sized clot yesterday. Has intermittent dizzyness (not new since hospital discharge).  She notes no new symptoms otherwise.  She was supposed to get lab work done yesterday, but did not.   Past Medical History:  Diagnosis Date  . Double aortic arch   . Double aortic arch   . Ectopic pregnancy 08/2017  . Hx of varicella   . Ovarian cyst     Past Surgical History:  Procedure Laterality Date  . NO PAST SURGERIES    . WISDOM TOOTH EXTRACTION  2009   all four    Gynecologic History: No LMP recorded.  Obstetric History: [id number]  Family History  Problem Relation Age of Onset  . Drug abuse Mother   . Cancer Mother        cervix  . Alcohol abuse Father   . Hypertension Maternal Grandmother   . Diabetes Maternal Grandmother   . Hypertension Maternal Grandfather   . Diabetes Maternal Grandfather   . Hypertension Paternal Grandmother   . Hypertension Paternal Grandfather     Social History   Socioeconomic History  . Marital status: Single    Spouse name: Not on file  . Number of children: Not on file  . Years of education: Not on file  . Highest education level: Not on  file  Social Needs  . Financial resource strain: Not on file  . Food insecurity - worry: Not on file  . Food insecurity - inability: Not on file  . Transportation needs - medical: Not on file  . Transportation needs - non-medical: Not on file  Occupational History  . Not on file  Tobacco Use  . Smoking status: Current Every Day Smoker    Packs/day: 0.25    Types: Cigarettes  . Smokeless tobacco: Never Used  Substance and Sexual Activity  . Alcohol use: Yes    Comment: Occasionally  . Drug use: No  . Sexual activity: Yes    Birth control/protection: None  Other Topics Concern  . Not on file  Social History Narrative  . Not on file    Allergies  Allergen Reactions  . Other Other (See Comments)    Pt reports a reaction "vital sign changes" to a beta blocker. She does not know the name of the medication.  . Beta Adrenergic Blockers Other (See Comments)    Vitals dropped "I was pretty much dying"    Prior to Admission medications   Medication Sig Start Date End Date Taking? Authorizing Provider  oxyCODONE-acetaminophen (ROXICET) 5-325 MG tablet Take 1 tablet by mouth every 6 (six) hours as needed for up to 3 days for severe pain. 08/15/17 08/18/17  Conard Novak, MD  Review of Systems  Constitutional: Negative for chills and fever.  HENT: Negative.   Eyes: Negative.   Respiratory: Negative.   Cardiovascular: Negative.  Negative for chest pain and palpitations.  Gastrointestinal: Positive for abdominal pain (see hpi). Negative for constipation, diarrhea, nausea and vomiting.  Genitourinary: Negative.        See HPI  Musculoskeletal: Negative.   Skin: Negative.   Neurological: Positive for dizziness (intermittent, unchanged from prior visit) and weakness.  Psychiatric/Behavioral: Negative.      Physical Exam BP 100/70   Pulse 96   Ht 5\' 4"  (1.626 m)   Wt 136 lb (61.7 kg)   BMI 23.34 kg/m  No LMP recorded. Physical Exam  Constitutional: She is oriented to  person, place, and time. She appears well-developed and well-nourished. No distress.  HENT:  Head: Normocephalic and atraumatic.  Eyes: EOM are normal. No scleral icterus.  Neck: Normal range of motion. Neck supple.  Cardiovascular: Normal rate and regular rhythm.  Pulmonary/Chest: Effort normal and breath sounds normal. No respiratory distress. She has no wheezes. She has no rales.  Abdominal: Soft. Bowel sounds are normal. She exhibits no distension and no mass. There is tenderness (mild ttp RLQ). There is no rebound and no guarding.  Musculoskeletal: Normal range of motion. She exhibits no edema.  Neurological: She is alert and oriented to person, place, and time. No cranial nerve deficit.  Skin: Skin is warm and dry. No erythema.  Psychiatric: She has a normal mood and affect. Her behavior is normal. Judgment normal.   Assessment: 27 y.o. [id number] female here for  1. Ectopic pregnancy without intrauterine pregnancy, unspecified location     Plan: Problem List Items Addressed This Visit      Other   Ectopic pregnancy - Primary   Relevant Orders   Beta HCG, Quant   CBC     Continue with current plan of care. Strongly recommend she get back into care with her cardiologist for surgical clearance for future purposes.  Strongly recommend she continue to follow up to track her beta hCG to zero.   Will see her back in 3 days to recheck her hCG level.  15 minutes spent in face to face discussion with > 50% spent in counseling, management, and coordination of care of  her ectopic pregnancy.   Thomasene MohairStephen Kaikoa Magro, MD 08/17/2017 2:28 PM

## 2017-08-20 ENCOUNTER — Ambulatory Visit (INDEPENDENT_AMBULATORY_CARE_PROVIDER_SITE_OTHER): Payer: Medicaid Other | Admitting: Obstetrics and Gynecology

## 2017-08-20 ENCOUNTER — Encounter: Payer: Self-pay | Admitting: Obstetrics and Gynecology

## 2017-08-20 ENCOUNTER — Other Ambulatory Visit
Admission: RE | Admit: 2017-08-20 | Discharge: 2017-08-20 | Disposition: A | Discharge status: Home / Self Care | Payer: Medicaid Other | Source: Ambulatory Visit | Attending: Obstetrics and Gynecology | Admitting: Obstetrics and Gynecology

## 2017-08-20 VITALS — BP 122/80 | Ht 64.0 in | Wt 134.0 lb

## 2017-08-20 DIAGNOSIS — O009 Unspecified ectopic pregnancy without intrauterine pregnancy: Secondary | ICD-10-CM | POA: Diagnosis not present

## 2017-08-20 DIAGNOSIS — Z3A00 Weeks of gestation of pregnancy not specified: Secondary | ICD-10-CM | POA: Diagnosis not present

## 2017-08-20 DIAGNOSIS — Z362 Encounter for other antenatal screening follow-up: Secondary | ICD-10-CM | POA: Diagnosis not present

## 2017-08-20 LAB — HCG, QUANTITATIVE, PREGNANCY: hCG, Beta Chain, Quant, S: 119 m[IU]/mL — ABNORMAL HIGH (ref <5)

## 2017-08-20 NOTE — Progress Notes (Signed)
Obstetrics & Gynecology Office Visit   Chief Complaint  Patient presents with  . Follow-up  ectopic pregnancy  History of Present Illness: 27 y.o. [id number] female presenting in follow up for ectopic pregnancy of unknown anatomic location.  She returns after a clinic visit three days ago. She continues to have right lower quadrant abdominal pain that does not radiate. The pain is noted to be severe. She is out of pain medication. She is using a heating pad mainly for pain. She notes her bleeding is much lighter. Nothing else make the pain better or worse.  She has had the following changes to her hCG levels:  10 days ago: 2,218 9 days ago: 2,521 7 days ago: 1,838 (received methotrexate this date) 6 days ago: 1,031 5  Days ago: 738 3 days ago: 413  She denies feeling dizzy and lightheaded.  She has no other associated symptoms.    Past Medical History:  Diagnosis Date  . Double aortic arch   . Double aortic arch   . Ectopic pregnancy 08/2017  . Hx of varicella   . Ovarian cyst     Past Surgical History:  Procedure Laterality Date  . NO PAST SURGERIES    . WISDOM TOOTH EXTRACTION  2009   all four    Gynecologic History: No LMP recorded.  Obstetric History: [id number]  Family History  Problem Relation Age of Onset  . Drug abuse Mother   . Cancer Mother        cervix  . Alcohol abuse Father   . Hypertension Maternal Grandmother   . Diabetes Maternal Grandmother   . Hypertension Maternal Grandfather   . Diabetes Maternal Grandfather   . Hypertension Paternal Grandmother   . Hypertension Paternal Grandfather     Social History   Socioeconomic History  . Marital status: Single    Spouse name: Not on file  . Number of children: Not on file  . Years of education: Not on file  . Highest education level: Not on file  Social Needs  . Financial resource strain: Not on file  . Food insecurity - worry: Not on file  . Food insecurity - inability: Not on file  .  Transportation needs - medical: Not on file  . Transportation needs - non-medical: Not on file  Occupational History  . Not on file  Tobacco Use  . Smoking status: Current Every Day Smoker    Packs/day: 0.25    Types: Cigarettes  . Smokeless tobacco: Never Used  Substance and Sexual Activity  . Alcohol use: Yes    Comment: Occasionally  . Drug use: No  . Sexual activity: Yes    Birth control/protection: None  Other Topics Concern  . Not on file  Social History Narrative  . Not on file    Allergies  Allergen Reactions  . Other Other (See Comments)    Pt reports a reaction "vital sign changes" to a beta blocker. She does not know the name of the medication.  . Beta Adrenergic Blockers Other (See Comments)    Vitals dropped "I was pretty much dying"    Prior to Admission medications   None   Review of Systems  Constitutional: Negative.   HENT: Negative.   Eyes: Negative.   Respiratory: Negative.   Cardiovascular: Negative.   Gastrointestinal: Positive for abdominal pain (RLQ as per HPI). Negative for constipation, diarrhea, heartburn, nausea and vomiting.  Genitourinary: Negative.        Minimal vaginal bleeding  Musculoskeletal:  Negative.   Skin: Negative.   Neurological: Negative.   Psychiatric/Behavioral: Negative.      Physical Exam BP 122/80   Ht 5\' 4"  (1.626 m)   Wt 134 lb (60.8 kg)   BMI 23.00 kg/m  No LMP recorded. Physical Exam  Constitutional: She is oriented to person, place, and time. She appears well-developed and well-nourished. No distress.  HENT:  Head: Normocephalic and atraumatic.  Eyes: EOM are normal. No scleral icterus.  Neck: Normal range of motion. Neck supple.  Cardiovascular: Normal rate and regular rhythm.  Pulmonary/Chest: Effort normal and breath sounds normal. No respiratory distress. She has no wheezes. She has no rales.  Abdominal: Soft. Bowel sounds are normal. She exhibits no distension and no mass. There is tenderness (mild  RLQ). There is no rebound and no guarding.  Musculoskeletal: Normal range of motion. She exhibits no edema.  Neurological: She is alert and oriented to person, place, and time. No cranial nerve deficit.  Skin: Skin is warm and dry. No erythema.  Psychiatric: She has a normal mood and affect. Her behavior is normal. Judgment normal.    Assessment: 27 y.o. [id number] female here for  1. Ectopic pregnancy without intrauterine pregnancy, unspecified location      Plan: Problem List Items Addressed This Visit      Other   Ectopic pregnancy - Primary   Relevant Orders   Beta HCG, Quant     Will check quantitative hCG to insure she continues to have a suitable decline. Will have follow up based on these results.   Heating pad and Tylenol for pain. No new narcotic prescription given.  She signed ROI to send information to her cardiologist. She would also like to transfer her gynecologic care here from [facility].  ROI sto be signed for this release of information, as well.   15 minutes spent in face to face discussion with > 50% spent in counseling, management, and coordination of care of her ectopic pregnancy.   Thomasene Mohair, MD 08/20/2017 5:30 PM

## 2017-09-04 ENCOUNTER — Encounter: Payer: Medicaid Other | Admitting: Obstetrics & Gynecology

## 2017-10-16 ENCOUNTER — Ambulatory Visit (INDEPENDENT_AMBULATORY_CARE_PROVIDER_SITE_OTHER): Payer: Medicaid Other | Admitting: Obstetrics and Gynecology

## 2017-10-16 ENCOUNTER — Other Ambulatory Visit (INDEPENDENT_AMBULATORY_CARE_PROVIDER_SITE_OTHER): Payer: Medicaid Other

## 2017-10-16 ENCOUNTER — Encounter: Payer: Self-pay | Admitting: Obstetrics and Gynecology

## 2017-10-16 VITALS — BP 118/70 | Ht 64.0 in | Wt 135.0 lb

## 2017-10-16 DIAGNOSIS — N83202 Unspecified ovarian cyst, left side: Secondary | ICD-10-CM

## 2017-10-16 DIAGNOSIS — O009 Unspecified ectopic pregnancy without intrauterine pregnancy: Secondary | ICD-10-CM

## 2017-10-16 DIAGNOSIS — N83201 Unspecified ovarian cyst, right side: Secondary | ICD-10-CM

## 2017-10-16 DIAGNOSIS — R1031 Right lower quadrant pain: Secondary | ICD-10-CM | POA: Diagnosis not present

## 2017-10-16 HISTORY — DX: Right lower quadrant pain: R10.31

## 2017-10-16 HISTORY — DX: Unspecified ovarian cyst, right side: N83.201

## 2017-10-16 NOTE — Progress Notes (Signed)
Obstetrics & Gynecology Office Visit   Chief Complaint  Patient presents with  . Pelvic Pain   History of Present Illness: 27 y.o. [id number] female with history of ectopic pregnancy in January, treated with methotrexate.   She continues to have pain on right side.  The pain has been present since January.  The pain is always present.  The pain is severe.  It does not radiate, but she does have occasional left-sided pain.  Nothing makes the pain feel better.  Intercourse makes it worse.  She does have nausea, not sure if it is related. She has also had really bad migraines lately.  In February, she had a period that lasted a few days and was very light, which was unusual for her (normally very heavy). Her menses was on 2/15.  Her periods lasted for 3 days.  She has a history of a redundant aortic arch.  She has not seen her cardiologist   hCG pattern: 08/20/17: 119 08/17/17: 413 08/15/17: 738 08/14/17: 1,031  U/s today reveals  Findings:  The uterus is anteverted and measures 8.23 x 5.95 x 3.85. Echo texture is homogenous without evidence of focal masses.  The Endometrium measures 5.59 mm.  Right Ovary measures 7.68 x 6.94 x 5.93 cm. It is not normal in appearance.  - there is a large hemorrhagic cyst within the right ovary (6.54 x 5.26cm)  - arterial and venous blood flow are identified within th right ovary ( no evidence of torsion) Left Ovary measures 2.33 x 1.58 x 1.70 cm. It is normal in appearance. Survey of the adnexa demonstrates no adnexal masses. There is minimal free fluid in the cul de sac.  Past Medical History:  Diagnosis Date  . Double aortic arch   . Double aortic arch   . Ectopic pregnancy 08/2017  . Hx of varicella   . Ovarian cyst     Past Surgical History:  Procedure Laterality Date  . NO PAST SURGERIES    . WISDOM TOOTH EXTRACTION  2009   all four    Gynecologic History: Patient's last menstrual period was 09/21/2017.  Obstetric History:  [id number]  Family History  Problem Relation Age of Onset  . Drug abuse Mother   . Cancer Mother        cervix  . Alcohol abuse Father   . Hypertension Maternal Grandmother   . Diabetes Maternal Grandmother   . Hypertension Maternal Grandfather   . Diabetes Maternal Grandfather   . Hypertension Paternal Grandmother   . Hypertension Paternal Grandfather     Social History   Socioeconomic History  . Marital status: Single    Spouse name: Not on file  . Number of children: Not on file  . Years of education: Not on file  . Highest education level: Not on file  Social Needs  . Financial resource strain: Not on file  . Food insecurity - worry: Not on file  . Food insecurity - inability: Not on file  . Transportation needs - medical: Not on file  . Transportation needs - non-medical: Not on file  Occupational History  . Not on file  Tobacco Use  . Smoking status: Current Every Day Smoker    Packs/day: 0.25    Types: Cigarettes  . Smokeless tobacco: Never Used  Substance and Sexual Activity  . Alcohol use: Yes    Comment: Occasionally  . Drug use: No  . Sexual activity: Yes    Birth control/protection: None  Other Topics Concern  .  Not on file  Social History Narrative  . Not on file    Allergies  Allergen Reactions  . Other Other (See Comments)    Pt reports a reaction "vital sign changes" to a beta blocker. She does not know the name of the medication.  . Beta Adrenergic Blockers Other (See Comments)    Vitals dropped "I was pretty much dying"    Medications   Denies    Review of Systems  Constitutional: Negative.   HENT: Negative.   Eyes: Negative.   Respiratory: Negative.   Cardiovascular: Negative.   Gastrointestinal: Positive for abdominal pain (see HPI). Negative for blood in stool, constipation, diarrhea, heartburn, melena, nausea and vomiting.  Genitourinary: Negative.   Musculoskeletal: Negative.   Skin: Negative.   Neurological: Negative.    Psychiatric/Behavioral: Negative.      Physical Exam BP 118/70   Ht 5\' 4"  (1.626 m)   Wt 135 lb (61.2 kg)   LMP 09/21/2017   BMI 23.17 kg/m  Patient's last menstrual period was 09/21/2017. Physical Exam  Constitutional: She is oriented to person, place, and time. She appears well-developed and well-nourished. No distress.  HENT:  Head: Normocephalic and atraumatic.  Eyes: EOM are normal. No scleral icterus.  Neck: Normal range of motion. Neck supple. No thyromegaly present.  Cardiovascular: Normal rate and regular rhythm. Exam reveals no gallop and no friction rub.  No murmur heard. Pulmonary/Chest: Effort normal and breath sounds normal. No respiratory distress. She has no wheezes. She has no rales. Right breast exhibits no inverted nipple, no mass, no nipple discharge, no skin change and no tenderness. Left breast exhibits no inverted nipple, no mass, no nipple discharge, no skin change and no tenderness.  Abdominal: Soft. Bowel sounds are normal. She exhibits no distension and no mass. There is tenderness (mild lower abdominal tenderness). There is no rebound and no guarding.  Musculoskeletal: Normal range of motion. She exhibits no edema or tenderness.  Lymphadenopathy:    She has no cervical adenopathy.  Neurological: She is alert and oriented to person, place, and time. No cranial nerve deficit.  Skin: Skin is warm and dry. No rash noted. No erythema.  Psychiatric: She has a normal mood and affect. Her behavior is normal. Judgment normal.   Assessment: 27 y.o. [id number] female here for  1. Ectopic pregnancy without intrauterine pregnancy, unspecified location   2. Right lower quadrant abdominal pain   3. Right ovarian cyst      Plan: Problem List Items Addressed This Visit      Genitourinary   Right ovarian cyst     Other   Ectopic pregnancy - Primary   Relevant Orders   US PELVIS TRANSVANGINAL NON-OB (TV ONLY)   Beta hCG quant (ref lab)   Right lower quadrant  abdominal pain   Relevant Orders   US PELVIS TRANSVANGINAL NON-OB (TV ONLY)   Beta hCG quant (ref lab)     Will schedule surgery for as soon as we can to remove ovarian cyst, which is and has been causing her significant pain. Based on anesthesiology request from when she was hospitalized in January, will need clearance from her cardiologist. Release of information sent to cardiologist. Patient to call cardiologist to see whether an appointment is necessary.  Will go ahead and schedule for 2-3 weeks out to give her time to schedule.   Thomasene Mohair, MD 10/16/2017 5:23 PM

## 2017-10-17 ENCOUNTER — Telehealth: Payer: Self-pay | Admitting: Obstetrics and Gynecology

## 2017-10-17 LAB — BETA HCG QUANT (REF LAB): hCG Quant: <1 m[IU]/mL

## 2017-10-17 NOTE — Telephone Encounter (Signed)
-----   Message from Conard NovakStephen D [name], MD sent at 10/16/2017  5:25 PM EDT ----- Regarding: Schedule surgery Surgery Booking Request Patient Full Name:  Alexa CarinaLauren N Capes  MRN: [id number]  DOB: 12-30-1990  Surgeon: Thomasene MohairStephen [name], MD  Requested Surgery Date and Time: 2-3 weeks Primary Diagnosis AND Code: right ovarian cyst, history of ectopic Secondary Diagnosis and Code:  Surgical Procedure: operative laparoscopy, right ovarian cystectomy, possible right salpingectomy L&D Notification: No Admission Status: same day surgery Length of Surgery: 60 minutes Special Case Needs: none H&P: tbd (date) Phone Interview???: no Interpreter: Language:  Medical Clearance: yes. From Cardiologist. Patient aware is trying to obtain Special Scheduling Instructions: no

## 2017-10-17 NOTE — Telephone Encounter (Signed)
Patient is aware of [facility] on 10/24/17 @ 10:10am w/ Dr Jean RosenthalJackson, Pre-admit Testing to be scheduled (10/24/17 @ 11:15am requested), and OR on 11/01/17. Patient is aware she needs cardiology clearance, and said her cardiologist is Dr Andree CossJonathon Berry, who is located in [place] off of [address], but she has not been seen since 2014. Patient is aware she must call Dr Hazle CocaBerry's office to make an appointment for clearance, that she will not be cleared without being seen.

## 2017-10-17 NOTE — Telephone Encounter (Signed)
Patient called back to say the earliest available appointment for Dr Allyson SabalBerry is 11/09/17. H&P is rescheduled for 11/08/17 @ 10:50am and surgery is rescheduled for 11/15/17.

## 2017-10-17 NOTE — Telephone Encounter (Signed)
Per JB 

## 2017-10-17 NOTE — Telephone Encounter (Signed)
It doesn't appear that I've seen [facility] [address]

## 2017-10-18 NOTE — Telephone Encounter (Signed)
The patient is scheduled to see MD on 11/09/17 @ 0930. Her last visit was prior to Epic, of which Dr. Hazle CocaBerry's office started in spring 2014

## 2017-10-24 ENCOUNTER — Encounter: Payer: Self-pay | Admitting: Obstetrics and Gynecology

## 2017-10-26 ENCOUNTER — Ambulatory Visit: Payer: Self-pay | Admitting: Cardiovascular Disease

## 2017-10-31 ENCOUNTER — Other Ambulatory Visit: Payer: Self-pay | Admitting: Obstetrics and Gynecology

## 2017-10-31 DIAGNOSIS — Q2545 Double aortic arch: Secondary | ICD-10-CM

## 2017-11-08 ENCOUNTER — Encounter: Payer: Self-pay | Admitting: Obstetrics and Gynecology

## 2017-11-08 ENCOUNTER — Encounter
Admission: RE | Admit: 2017-11-08 | Discharge: 2017-11-08 | Disposition: A | Discharge status: Home / Self Care | Payer: Medicaid Other | Source: Ambulatory Visit | Attending: Obstetrics and Gynecology | Admitting: Obstetrics and Gynecology

## 2017-11-08 ENCOUNTER — Other Ambulatory Visit: Payer: Self-pay

## 2017-11-08 ENCOUNTER — Ambulatory Visit (INDEPENDENT_AMBULATORY_CARE_PROVIDER_SITE_OTHER): Payer: Medicaid Other | Admitting: Obstetrics and Gynecology

## 2017-11-08 VITALS — BP 114/70 | Ht 64.0 in | Wt 134.0 lb

## 2017-11-08 DIAGNOSIS — N83201 Unspecified ovarian cyst, right side: Secondary | ICD-10-CM

## 2017-11-08 DIAGNOSIS — Q2545 Double aortic arch: Secondary | ICD-10-CM | POA: Insufficient documentation

## 2017-11-08 DIAGNOSIS — R1031 Right lower quadrant pain: Secondary | ICD-10-CM

## 2017-11-08 DIAGNOSIS — Z01818 Encounter for other preprocedural examination: Secondary | ICD-10-CM | POA: Insufficient documentation

## 2017-11-08 DIAGNOSIS — F17210 Nicotine dependence, cigarettes, uncomplicated: Secondary | ICD-10-CM | POA: Diagnosis not present

## 2017-11-08 HISTORY — DX: Bipolar disorder, unspecified: F31.9

## 2017-11-08 HISTORY — DX: Hypothyroidism, unspecified: E03.9

## 2017-11-08 HISTORY — DX: Major depressive disorder, single episode, unspecified: F32.9

## 2017-11-08 HISTORY — DX: Depression, unspecified: F32.A

## 2017-11-08 HISTORY — DX: Anxiety disorder, unspecified: F41.9

## 2017-11-08 LAB — CBC
HCT: 39.2 % (ref 35.0–47.0)
Hemoglobin: 13.1 g/dL (ref 12.0–16.0)
MCH: 30.3 pg (ref 26.0–34.0)
MCHC: 33.4 g/dL (ref 32.0–36.0)
MCV: 90.6 fL (ref 80.0–100.0)
Platelets: 237 10*3/uL (ref 150–440)
RBC: 4.33 MIL/uL (ref 3.80–5.20)
RDW: 12.2 % (ref 11.5–14.5)
WBC: 9.3 10*3/uL (ref 3.6–11.0)

## 2017-11-08 NOTE — Progress Notes (Signed)
Preoperative History and Physical  Alexa Guerra is a 27 y.o. [id number] here for surgical management of right ovarian cyst.   No significant preoperative concerns.  History of Present Illness: 27 y.o. [id number] female with right ovarian cyst that is symptomatic.  Her pain is in her right lower quadrant and does not radiate.  The pain is severe. Nothing makes the pain better or worse. Her most recent hCG is <1 (she had an ectopic pregnancy in January this year.  She has a cardiac clearance appointment tomorrow.   Proposed surgery: laparoscopic right ovarian cystectomy  Past Medical History:  Diagnosis Date  . Double aortic arch   . Double aortic arch   . Ectopic pregnancy 08/2017  . Hx of varicella   . Ovarian cyst    Past Surgical History:  Procedure Laterality Date  . NO PAST SURGERIES    . WISDOM TOOTH EXTRACTION  2009   all four   OB History  Gravida Para Term Preterm AB Living  3 1 1   2 1   SAB TAB Ectopic Multiple Live Births  1   1   1     # Outcome Date GA Lbr Len/2nd Weight Sex Delivery Anes PTL Lv  3 Ectopic 08/15/17          2 Term 11/13/12 [redacted]w[redacted]d 13:45 / 01:35 8 lb (3.63 kg) F Vag-Spont EPI  LIV  1 SAB 2010          Patient denies any other pertinent gynecologic issues.   Current Outpatient Medications on File Prior to Visit  Medication Sig Dispense Refill  . FLUoxetine (PROZAC) 40 MG capsule Take by mouth daily.  1  . lamoTRIgine (LAMICTAL) 25 MG tablet TAKE 1 TABLET BY MOUTH EVERY DAY FOR 2 WKS, THEN 2 TABS DAILY FOR 2 WEEKS, THEN FOLLOWUP FOR RECHECK  0  . methimazole (TAPAZOLE) 5 MG tablet Take 5 mg by mouth daily.  1  . OLANZapine (ZYPREXA) 10 MG tablet TAKE 1 TABLET BY MOUTH EVERY DAY **DISCONTINUE OLANZAPINE-FLUOXETINE**  1   Current Facility-Administered Medications on File Prior to Visit  Medication Dose Route Frequency Provider Last Rate Last Dose  . methotrexate (50 mg/ml) chemo injection 80 mg  50 mg/m2 Intramuscular Once Vena Austria, MD        Allergies  Allergen Reactions  . Other Other (See Comments)    Pt reports a reaction "vital sign changes" to a beta blocker. She does not know the name of the medication.  . Beta Adrenergic Blockers Other (See Comments)    Vitals dropped "I was pretty much dying"    Social History:   reports that she has been smoking cigarettes.  She has been smoking about 0.25 packs per day. She has never used smokeless tobacco. She reports that she drinks alcohol. She reports that she does not use drugs.  Family History  Problem Relation Age of Onset  . Drug abuse Mother   . Cancer Mother        cervix  . Alcohol abuse Father   . Hypertension Maternal Grandmother   . Diabetes Maternal Grandmother   . Hypertension Maternal Grandfather   . Diabetes Maternal Grandfather   . Hypertension Paternal Grandmother   . Hypertension Paternal Grandfather     Review of Systems:  Review of Systems  Constitutional: Negative.   HENT: Negative.   Eyes: Negative.   Respiratory: Negative.   Cardiovascular: Negative.   Gastrointestinal: Negative.   Genitourinary: Negative.   Musculoskeletal: Negative.  Skin: Negative.   Neurological: Negative.   Psychiatric/Behavioral: Negative.      PHYSICAL EXAM: Blood pressure 114/70, height 5\' 4"  (1.626 m), weight 134 lb (60.8 kg). Physical Exam  Constitutional: She is oriented to person, place, and time. She appears well-developed and well-nourished. No distress.  HENT:  Head: Normocephalic and atraumatic.  Eyes: Conjunctivae are normal. No scleral icterus.  Neck: Normal range of motion. Neck supple.  Cardiovascular: Normal rate and regular rhythm.  Pulmonary/Chest: Effort normal and breath sounds normal. No respiratory distress.  Abdominal: Soft. Bowel sounds are normal. She exhibits no distension and no mass. There is no rebound and no guarding.  Musculoskeletal: Normal range of motion. She exhibits no edema.  Neurological: She is alert and oriented to  person, place, and time. No cranial nerve deficit.  Psychiatric: She has a normal mood and affect. Her behavior is normal. Judgment normal.     Labs: No results found for this or any previous visit (from the past 336 hour(s)).  Imaging Studies: [place] Pelvis Transvanginal Non-ob (tv Only)  Result Date: 10/22/2017 ULTRASOUND REPORT Location: [facility] Date of Service: 10/16/2017 Indications:Pelvic Pain Findings: The uterus is anteverted and measures 8.23 x 5.95 x 3.85. Echo texture is homogenous without evidence of focal masses. The Endometrium measures 5.59 mm. Right Ovary measures 7.68 x 6.94 x 5.93 cm. It is not normal in appearance.  - there is a large hemorrhagic cyst within the right ovary (6.54 x 5.26cm)  - arterial and venous blood flow are identified within th right ovary ( no evidence of torsion) Left Ovary measures 2.33 x 1.58 x 1.70 cm. It is normal in appearance. Survey of the adnexa demonstrates no adnexal masses. There is minimal free fluid in the cul de sac. Impression: 1. Large right hemorrhagic ovarian cyst without evidence of ovarian torsion Recommendations: 1.Clinical correlation with the patient's History and Physical Exam. Willette AlmaKristen Priestley, RDMS, RVT The ultrasound images and findings were reviewed by me and I agree with the above report. Thomasene MohairStephen Skylene Deremer, MD, Merlinda FrederickFACOG [facility], [facility] 10/22/2017 10:30 PM     Assessment: Patient Active Problem List   Diagnosis Date Noted  . Right ovarian cyst 10/16/2017  . Right lower quadrant abdominal pain 10/16/2017  . Ectopic pregnancy 08/14/2017    Plan: Patient will undergo surgical management with the above procedure.   The risks of surgery were discussed in detail with the patient including but not limited to: bleeding which may require transfusion or reoperation; infection which may require antibiotics; injury to surrounding organs which may involve bowel, bladder, ureters ; need for additional procedures  including laparoscopy or laparotomy; thromboembolic phenomenon, surgical site problems and other postoperative/anesthesia complications. Likelihood of success in alleviating the patient's condition was discussed. Routine postoperative instructions will be reviewed with the patient and her family in detail after surgery.  The patient concurred with the proposed plan, giving informed written consent for the surgery.  Preoperative prophylactic antibiotics, as indicated, and SCDs ordered on call to the OR.  Patient to get ultrasound prior to surgery to verify surgery is still necessary.  Cardiac clearance on 11/09/17 still pending.   Thomasene MohairStephen Doren Kaspar, MD 11/08/2017 11:32 AM

## 2017-11-08 NOTE — Patient Instructions (Signed)
Your procedure is scheduled on: Thursday 11/15/17 Report to [facility]. To find out your arrival time please call [phone] between 1PM - 3PM on Wednesday 11/14/17.  Remember: Instructions that are not followed completely may result in serious medical risk, up to and including death, or upon the discretion of your surgeon and anesthesiologist your surgery may need to be rescheduled.     _X__ 1. Do not eat food after midnight the night before your procedure.                 No gum chewing or hard candies. You may drink clear liquids up to 2 hours                 before you are scheduled to arrive for your surgery- DO not drink clear                 liquids within 2 hours of the start of your surgery.                 Clear Liquids include:  water, apple juice without pulp, clear carbohydrate                 drink such as Clearfast or Gatorade, Black Coffee or Tea (Do not add                 anything to coffee or tea).  __X__2.  On the morning of surgery brush your teeth with toothpaste and water, you                 may rinse your mouth with mouthwash if you wish.  Do not swallow any              toothpaste of mouthwash.     _X__ 3.  No Alcohol for 24 hours before or after surgery.   _X__ 4.  Do Not Smoke or use e-cigarettes For 24 Hours Prior to Your Surgery.                 Do not use any chewable tobacco products for at least 6 hours prior to                 surgery.  ____  5.  Bring all medications with you on the day of surgery if instructed.   __X__  6.  Notify your doctor if there is any change in your medical condition      (cold, fever, infections).     Do not wear jewelry, make-up, hairpins, clips or nail polish. Do not wear lotions, powders, or perfumes.  Do not shave 48 hours prior to surgery. Men may shave face and neck. Do not bring valuables to the hospital.    [facility] is not responsible for any belongings or  valuables.  Contacts, dentures/partials or body piercings may not be worn into surgery. Bring a case for your contacts, glasses or hearing aids, a denture cup will be supplied. Leave your suitcase in the car. After surgery it may be brought to your room. For patients admitted to the hospital, discharge time is determined by your treatment team.   Patients discharged the day of surgery will not be allowed to drive home.   Please read over the following fact sheets that you were given:   MRSA Information  __X__ Take these medicines the morning of surgery with A SIP OF WATER:  1. methimazole  2.   3.   4.  5.  6.  ____ Fleet Enema (as directed)   __X__ Use CHG Soap/SAGE wipes as directed  ____ Use inhalers on the day of surgery  ____ Stop metformin/Janumet/Farxiga 2 days prior to surgery    ____ Take 1/2 of usual insulin dose the night before surgery. No insulin the morning          of surgery.   ____ Stop Blood Thinners Coumadin/Plavix/Xarelto/Pleta/Pradaxa/Eliquis/Effient/Aspirin  on   Or contact your Surgeon, Cardiologist or Medical Doctor regarding  ability to stop your blood thinners  __X__ Stop Anti-inflammatories 7 days before surgery such as Advil, Ibuprofen, Motrin,  BC or Goodies Powder, Naprosyn, Naproxen, Aleve, Aspirin  You may take Tylenol   __X__ Stopall herbal supplements, fish oil or vitamin E until after surgery.    ____ Bring C-Pap to the hospital.

## 2017-11-09 ENCOUNTER — Ambulatory Visit (INDEPENDENT_AMBULATORY_CARE_PROVIDER_SITE_OTHER): Payer: Medicaid Other | Admitting: Cardiovascular Disease

## 2017-11-09 ENCOUNTER — Encounter: Payer: Self-pay | Admitting: Cardiovascular Disease

## 2017-11-09 ENCOUNTER — Other Ambulatory Visit (HOSPITAL_COMMUNITY): Payer: Medicaid Other

## 2017-11-09 ENCOUNTER — Ambulatory Visit: Payer: Self-pay | Admitting: Cardiovascular Disease

## 2017-11-09 VITALS — BP 106/66 | HR 85 | Ht 64.0 in | Wt 134.4 lb

## 2017-11-09 DIAGNOSIS — Q249 Congenital malformation of heart, unspecified: Secondary | ICD-10-CM

## 2017-11-09 DIAGNOSIS — Z01818 Encounter for other preprocedural examination: Secondary | ICD-10-CM | POA: Diagnosis not present

## 2017-11-09 DIAGNOSIS — Q2545 Double aortic arch: Secondary | ICD-10-CM | POA: Diagnosis not present

## 2017-11-09 HISTORY — DX: Congenital malformation of heart, unspecified: Q24.9

## 2017-11-09 LAB — TYPE AND SCREEN
ABO/RH(D): O NEG
Antibody Screen: POSITIVE
Extend sample reason: UNDETERMINED

## 2017-11-09 NOTE — Assessment & Plan Note (Signed)
Alexa Guerra returns today after being seen 5 years ago by me during her pregnancy. She is the mother of one healthy child and had no problems during her pregnancy. 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 She has no murmur on exam today.She scheduled for laparoscopic ovarian cyst surgery next week as an outpatient. I'm going to the echo to further evaluate prior to clearing her. I do not think she will have a probable general anesthesia however.

## 2017-11-09 NOTE — Progress Notes (Signed)
11/09/2017 Alexa Guerra   08-14-1990  [id number]  Primary Physician Marletta LorBarr, Julie, NP Primary Cardiologist: Runell GessJonathan J Berry MD Nicholes CalamityFACP, FACC, FAHA, [place]  HPI:  Alexa Guerra is a 27 y.o. thin and fit appearing single Caucasian female other one child who works at [employer]. She was referred for preoperative clearance prior to ovarian cyst removal next Thursday which apparently will be done laparoscopically as an outpatient. I last saw her 5 years ago during her pregnancy. 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 I do not have records of that visit nor do I have records of prior 2-D echo. She does smoke a half pack a day and is been smoking off and on for 15 years. She has no other significant past medical history. She has minimal shortness of breath probably related to smoking.    Current Meds  Medication Sig  . FLUoxetine (PROZAC) 40 MG capsule Take by mouth every evening.   . lamoTRIgine (LAMICTAL) 25 MG tablet TAKE 1 TABLET BY MOUTH EVERY DAY FOR 2 WKS, THEN 2 TABS DAILY FOR 2 WEEKS, THEN FOLLOWUP FOR RECHECK  . methimazole (TAPAZOLE) 5 MG tablet Take 5 mg by mouth daily.  . Multiple Vitamin (MULTIVITAMIN) tablet Take 1 tablet by mouth daily.  Marland Kitchen. OLANZapine (ZYPREXA) 10 MG tablet TAKE 1 TABLET BY MOUTH EVERY DAY **DISCONTINUE OLANZAPINE-FLUOXETINE**     Allergies  Allergen Reactions  . Other Other (See Comments)    Pt reports a reaction "vital sign changes" to a beta blocker. She does not know the name of the medication.  . Beta Adrenergic Blockers Other (See Comments)    Vitals dropped "I was pretty much dying"    Social History   Socioeconomic History  . Marital status: Single    Spouse name: Not on file  . Number of children: Not on file  . Years of education: Not on file  . Highest education level: Not on file  Occupational History  . Not on file  Social Needs  .  Financial resource strain: Not on file  . Food insecurity:    Worry: Not on file    Inability: Not on file  . Transportation needs:    Medical: Not on file    Non-medical: Not on file  Tobacco Use  . Smoking status: Current Every Day Smoker    Packs/day: 0.25    Types: Cigarettes  . Smokeless tobacco: Never Used  Substance and Sexual Activity  . Alcohol use: Yes    Comment: Occasionally  . Drug use: No  . Sexual activity: Yes    Birth control/protection: None  Lifestyle  . Physical activity:    Days per week: Not on file    Minutes per session: Not on file  . Stress: Not on file  Relationships  . Social connections:    Talks on phone: Not on file    Gets together: Not on file    Attends religious service: Not on file    Active member of club or organization: Not on file    Attends meetings of clubs or organizations: Not on file    Relationship status: Not on file  . Intimate partner violence:    Fear of current or ex partner: Not on file    Emotionally abused: Not on file    Physically abused: Not on file    Forced sexual activity: Not on  file  Other Topics Concern  . Not on file  Social History Narrative  . Not on file     Review of Systems: General: negative for chills, fever, night sweats or weight changes.  Cardiovascular: negative for chest pain, dyspnea on exertion, edema, orthopnea, palpitations, paroxysmal nocturnal dyspnea or shortness of breath Dermatological: negative for rash Respiratory: negative for cough or wheezing Urologic: negative for hematuria Abdominal: negative for nausea, vomiting, diarrhea, bright red blood per rectum, melena, or hematemesis Neurologic: negative for visual changes, syncope, or dizziness All other systems reviewed and are otherwise negative except as noted above.    Blood pressure 106/66, pulse 85, height 5\' 4"  (1.626 m), weight 134 lb 6.4 oz (61 kg), last menstrual period 10/21/2017.  General appearance: alert and no  distress Neck: no adenopathy, no carotid bruit, no JVD, supple, symmetrical, trachea midline and thyroid not enlarged, symmetric, no tenderness/mass/nodules Lungs: clear to auscultation bilaterally Heart: normal apical impulse Extremities: extremities normal, atraumatic, no cyanosis or edema Pulses: 2+ and symmetric Skin: Skin color, texture, turgor normal. No rashes or lesions Neurologic: Alert and oriented X 3, normal strength and tone. Normal symmetric reflexes. Normal coordination and gait  EKG sinus rhythm 85 without ST or T-wave changes. I personally reviewed EKG  ASSESSMENT AND PLAN:   Adult congenital heart disease Alexa Guerra returns today after being seen 5 years ago by me during her pregnancy. She is the mother of one healthy child and had no problems during her pregnancy. 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 She has no murmur on exam today.She scheduled for laparoscopic ovarian cyst surgery next week as an outpatient. I'm going to the echo to further evaluate prior to clearing her. I do not think she will have a probable general anesthesia however.      Runell Gess MD FACP,FACC,FAHA, [facility] 11/09/2017 10:01 AM

## 2017-11-09 NOTE — Patient Instructions (Signed)
Medication Instructions: Your physician recommends that you continue on your current medications as directed. Please refer to the Current Medication list given to you today.   Testing/Procedures: Your physician has requested that you have an echocardiogram today. Echocardiography is a painless test that uses sound waves to create images of your heart. It provides your doctor with information about the size and shape of your heart and how well your heart's chambers and valves are working. This procedure takes approximately one hour. There are no restrictions for this procedure.  Follow-Up: Your physician recommends that you schedule a follow-up appointment as needed with Dr. Allyson SabalBerry.

## 2017-11-12 ENCOUNTER — Other Ambulatory Visit: Payer: Self-pay

## 2017-11-12 ENCOUNTER — Ambulatory Visit (HOSPITAL_COMMUNITY): Payer: Medicaid Other | Attending: Cardiology

## 2017-11-12 DIAGNOSIS — Q2545 Double aortic arch: Secondary | ICD-10-CM | POA: Diagnosis not present

## 2017-11-12 DIAGNOSIS — F17210 Nicotine dependence, cigarettes, uncomplicated: Secondary | ICD-10-CM | POA: Diagnosis not present

## 2017-11-12 DIAGNOSIS — Z01818 Encounter for other preprocedural examination: Secondary | ICD-10-CM | POA: Diagnosis not present

## 2017-11-13 ENCOUNTER — Ambulatory Visit (INDEPENDENT_AMBULATORY_CARE_PROVIDER_SITE_OTHER): Payer: Medicaid Other | Admitting: Obstetrics and Gynecology

## 2017-11-13 ENCOUNTER — Ambulatory Visit (INDEPENDENT_AMBULATORY_CARE_PROVIDER_SITE_OTHER): Payer: Medicaid Other

## 2017-11-13 ENCOUNTER — Encounter: Payer: Self-pay | Admitting: Obstetrics and Gynecology

## 2017-11-13 VITALS — BP 102/60 | Ht 64.0 in | Wt 133.0 lb

## 2017-11-13 DIAGNOSIS — R1031 Right lower quadrant pain: Secondary | ICD-10-CM

## 2017-11-13 DIAGNOSIS — N83201 Unspecified ovarian cyst, right side: Secondary | ICD-10-CM

## 2017-11-13 DIAGNOSIS — N941 Unspecified dyspareunia: Secondary | ICD-10-CM | POA: Insufficient documentation

## 2017-11-13 HISTORY — DX: Unspecified dyspareunia: N94.10

## 2017-11-13 NOTE — Progress Notes (Signed)
Gynecology Ultrasound Follow Up   Chief Complaint  Patient presents with  . Follow-up  right ovarian cyst, decision for surgery  History of Present Illness: Patient is a 27 y.o. female who presents today for ultrasound evaluation of the above .  Ultrasound demonstrates the following findings Adnexa:  A right ovarian cyst measuring 2.5 x 2.0 cm (previously 6.5 x 5.25 cm). Uterus: anteverted with endometrial stripe  4.9 mm Additional: no other findings,  Moderate free fluid in pelvis.  She also wants to discuss painful intercourse. She has been having this for about 1 year. She has pain on deep penetration only. She is also sore after.  She has no pain between intercourse episodes. She describes her menses as "the usual painful periods."  Nothing makes the pain better or worse.  There are no associated symptoms. For the past 4 months she has had some sort of pelvic process occurring (ectopic pregnancy, right ovarian cyst). I have no information on her pelvic status prior to that.  She had a negative gonorrhea/chlamydia test about 1.5 years ago.  She has had no testing since that time. She has no symptoms of infection and her partner has not changed. She is not currently on contraception. She is undecided as to whether she wants a pregnancy right now.   Past Medical History:  Diagnosis Date  . Anxiety   . Bipolar disorder (HCC)   . Depression   . Double aortic arch   . Double aortic arch   . Double aortic arch    w/ ring  . Ectopic pregnancy 08/2017  . Hx of varicella   . Hypothyroidism   . Ovarian cyst     Past Surgical History:  Procedure Laterality Date  . NO PAST SURGERIES    . WISDOM TOOTH EXTRACTION  2009   all four   Family History  Problem Relation Age of Onset  . Drug abuse Mother   . Cancer Mother        cervix  . Alcohol abuse Father   . Hypertension Maternal Grandmother   . Diabetes Maternal Grandmother   . Hypertension Maternal Grandfather   . Diabetes  Maternal Grandfather   . Hypertension Paternal Grandmother   . Hypertension Paternal Grandfather     Social History   Socioeconomic History  . Marital status: Single    Spouse name: Not on file  . Number of children: Not on file  . Years of education: Not on file  . Highest education level: Not on file  Occupational History  . Not on file  Social Needs  . Financial resource strain: Not on file  . Food insecurity:    Worry: Not on file    Inability: Not on file  . Transportation needs:    Medical: Not on file    Non-medical: Not on file  Tobacco Use  . Smoking status: Current Every Day Smoker    Packs/day: 0.25    Types: Cigarettes  . Smokeless tobacco: Never Used  Substance and Sexual Activity  . Alcohol use: Yes    Comment: Occasionally  . Drug use: No  . Sexual activity: Yes    Birth control/protection: None  Lifestyle  . Physical activity:    Days per week: Not on file    Minutes per session: Not on file  . Stress: Not on file  Relationships  . Social connections:    Talks on phone: Not on file    Gets together: Not on file  Attends religious service: Not on file    Active member of club or organization: Not on file    Attends meetings of clubs or organizations: Not on file    Relationship status: Not on file  . Intimate partner violence:    Fear of current or ex partner: Not on file    Emotionally abused: Not on file    Physically abused: Not on file    Forced sexual activity: Not on file  Other Topics Concern  . Not on file  Social History Narrative  . Not on file    Allergies  Allergen Reactions  . Other Other (See Comments)    Pt reports a reaction "vital sign changes" to a beta blocker. She does not know the name of the medication.  . Beta Adrenergic Blockers Other (See Comments)    Vitals dropped "I was pretty much dying"    Prior to Admission medications   Medication Sig Start Date End Date Taking? Authorizing Provider  FLUoxetine  (PROZAC) 40 MG capsule Take by mouth every evening.  10/29/17  Yes [provider]  lamoTRIgine (LAMICTAL) 25 MG tablet TAKE 1 TABLET BY MOUTH EVERY DAY FOR 2 WKS, THEN 2 TABS DAILY FOR 2 WEEKS, THEN FOLLOWUP FOR RECHECK 10/26/17  Yes [provider]  methimazole (TAPAZOLE) 5 MG tablet Take 5 mg by mouth daily. 10/29/17  Yes [provider]  Multiple Vitamin (MULTIVITAMIN) tablet Take 1 tablet by mouth daily.   Yes [provider]  OLANZapine (ZYPREXA) 10 MG tablet TAKE 1 TABLET BY MOUTH EVERY DAY **DISCONTINUE OLANZAPINE-FLUOXETINE** 10/29/17  Yes [provider]    Physical Exam BP 102/60   Ht 5\' 4"  (1.626 m)   Wt 133 lb (60.3 kg)   LMP 10/21/2017 (Exact Date)   BMI 22.83 kg/m    General: NAD HEENT: normocephalic, anicteric Pulmonary: No increased work of breathing Extremities: no edema, erythema, or tenderness Neurologic: Grossly intact, normal gait Psychiatric: mood appropriate, affect full   Assessment: 27 y.o. [id number]  1. Right ovarian cyst   2. Dyspareunia, female      Plan: Problem List Items Addressed This Visit      Genitourinary   Right ovarian cyst - Primary     Other   Dyspareunia, female     Will hold on surgery for now, given resolving ovarian cyst. For dyspareunia, she would like to consider her options based on her desire to get pregnant now versus later.  Discussed in detail potential causes of dyspareunia and empirical treatment. She will consider and let me know.  15 minutes spent in face to face discussion with > 50% spent in counseling,management, and coordination of care of her right ovarian cyst and dyspareunia.   Thomasene Mohair, MD, [facility], [facility] 11/13/2017 5:51 PM

## 2017-11-14 ENCOUNTER — Telehealth: Payer: Self-pay | Admitting: Cardiovascular Disease

## 2017-11-14 NOTE — Telephone Encounter (Signed)
CLEARANCE LETTER RECV'D by Dr. Rocky LinkSteven Guerra's office. Dr. Edison PaceJackson's office states pt's surgery has been cancelled, though they will place clearance letter in pt's chart. Dr. Edison PaceJackson's office thanked me for all of our help.

## 2017-11-14 NOTE — Telephone Encounter (Signed)
   Primary Cardiologist: Nanetta BattyJonathan Berry, MD  Chart reviewed as part of pre-operative protocol coverage.  27 y.o. female with a history of congenital heart disease with double aortic arch, vascular ring with some valvular involvement.  She was evaluated by Dr. Allyson SabalBerry 11/09/17 for surgical clearance.  An echocardiogram was obtained 11/12/17 which demonstrated normal LV function and no significant valvular abnormalities.  Dr. Allyson SabalBerry reviewed the study and noted the patient was cleared to proceed with her gyn surgery.  Preop call back staff: Letter faxed to requesting surgeon.  Please ensure that letter was received by his/her office.  Tereso NewcomerScott Eduard Penkala, PA-C 11/14/2017, 2:07 PM

## 2017-11-14 NOTE — Telephone Encounter (Signed)
New message  Call from [facility] Pre-operative Risk Assessment    Request for surgical clearance:  1. What type of surgery is being performed? Lap right ovarian cystectomy  2. When is this surgery scheduled? 11/15/2017  3. What type of clearance is required (medical clearance vs. Pharmacy clearance to hold med vs. Both)? Both  4. Are there any medications that need to be held prior to surgery and how long? n/a  5. Practice name and name of physician performing surgery? Dr Donneta Romberg  6. What is your office phone number? [phone]   7.   What is your office fax number- fax [phone]  8.   Anesthesia type (None, local, MAC, general) ? general   Laurier Nancy 11/14/2017, 11:05 AM  _________________________________________________________________   (provider comments below)

## 2017-11-15 ENCOUNTER — Ambulatory Visit
Admission: RE | Admit: 2017-11-15 | Payer: Medicaid Other | Source: Ambulatory Visit | Admitting: Obstetrics and Gynecology

## 2017-11-15 ENCOUNTER — Encounter: Admission: RE | Payer: Self-pay | Source: Ambulatory Visit

## 2017-11-15 SURGERY — EXCISION, CYST, OVARY, LAPAROSCOPIC
Anesthesia: Choice | Laterality: Right

## 2017-11-26 ENCOUNTER — Other Ambulatory Visit: Payer: Self-pay | Admitting: Physician Assistant

## 2017-11-26 DIAGNOSIS — H903 Sensorineural hearing loss, bilateral: Secondary | ICD-10-CM

## 2018-06-09 ENCOUNTER — Other Ambulatory Visit: Payer: Self-pay

## 2018-06-09 DIAGNOSIS — Z79899 Other long term (current) drug therapy: Secondary | ICD-10-CM | POA: Diagnosis not present

## 2018-06-09 DIAGNOSIS — E039 Hypothyroidism, unspecified: Secondary | ICD-10-CM | POA: Insufficient documentation

## 2018-06-09 DIAGNOSIS — N83291 Other ovarian cyst, right side: Secondary | ICD-10-CM | POA: Diagnosis not present

## 2018-06-09 DIAGNOSIS — R1031 Right lower quadrant pain: Secondary | ICD-10-CM | POA: Diagnosis present

## 2018-06-09 DIAGNOSIS — F17210 Nicotine dependence, cigarettes, uncomplicated: Secondary | ICD-10-CM | POA: Diagnosis not present

## 2018-06-09 LAB — COMPREHENSIVE METABOLIC PANEL
ALBUMIN: 3.6 g/dL (ref 3.5–5.0)
ALT: 15 U/L (ref 0–44)
AST: 21 U/L (ref 15–41)
Alkaline Phosphatase: 47 U/L (ref 38–126)
Anion gap: 7 (ref 5–15)
BILIRUBIN TOTAL: 0.5 mg/dL (ref 0.3–1.2)
BUN: 15 mg/dL (ref 6–20)
CALCIUM: 8.7 mg/dL — AB (ref 8.9–10.3)
CO2: 26 mmol/L (ref 22–32)
CREATININE: 0.79 mg/dL (ref 0.44–1.00)
Chloride: 106 mmol/L (ref 98–111)
GFR calc non Af Amer: >60 mL/min (ref >60)
GLUCOSE: 94 mg/dL (ref 70–99)
POTASSIUM: 3.9 mmol/L (ref 3.5–5.1)
SODIUM: 139 mmol/L (ref 135–145)
TOTAL PROTEIN: 6 g/dL — AB (ref 6.5–8.1)

## 2018-06-09 LAB — URINALYSIS, COMPLETE (UACMP) WITH MICROSCOPIC
Bacteria, UA: NONE SEEN
Bilirubin Urine: NEGATIVE
GLUCOSE, UA: NEGATIVE mg/dL
Hgb urine dipstick: NEGATIVE
Ketones, ur: NEGATIVE mg/dL
LEUKOCYTES UA: NEGATIVE
Nitrite: NEGATIVE
PH: 5 (ref 5.0–8.0)
Protein, ur: NEGATIVE mg/dL
SPECIFIC GRAVITY, URINE: 1.025 (ref 1.005–1.030)

## 2018-06-09 LAB — CBC
HCT: 41 % (ref 36.0–46.0)
Hemoglobin: 13.7 g/dL (ref 12.0–15.0)
MCH: 30 pg (ref 26.0–34.0)
MCHC: 33.4 g/dL (ref 30.0–36.0)
MCV: 89.9 fL (ref 80.0–100.0)
NRBC: 0 % (ref 0.0–0.2)
PLATELETS: 218 10*3/uL (ref 150–400)
RBC: 4.56 MIL/uL (ref 3.87–5.11)
RDW: 12.1 % (ref 11.5–15.5)
WBC: 9.6 10*3/uL (ref 4.0–10.5)

## 2018-06-09 LAB — LIPASE, BLOOD: Lipase: 40 U/L (ref 11–51)

## 2018-06-09 LAB — POCT PREGNANCY, URINE: Preg Test, Ur: NEGATIVE

## 2018-06-09 NOTE — ED Triage Notes (Addendum)
RLQ pain x months. States had a miscarriage in august when she was [redacted] weeks pregnant. Hx cysts. A&O, ambulatory. Nausea. No vomiting or diarrhea. States had a negative preg test at home yesterday.

## 2018-06-10 ENCOUNTER — Emergency Department
Admission: EM | Admit: 2018-06-10 | Discharge: 2018-06-10 | Disposition: A | Discharge status: Home / Self Care | Payer: Medicaid Other | Attending: Emergency Medicine | Admitting: Emergency Medicine

## 2018-06-10 ENCOUNTER — Emergency Department: Payer: Medicaid Other

## 2018-06-10 DIAGNOSIS — N83209 Unspecified ovarian cyst, unspecified side: Secondary | ICD-10-CM

## 2018-06-10 MED ORDER — ONDANSETRON 4 MG PO TBDP: 4.0000 mg | ORAL_TABLET | Freq: Three times a day (TID) | ORAL | 0 refills | Status: DC | PRN

## 2018-06-10 MED ORDER — OXYCODONE-ACETAMINOPHEN 5-325 MG PO TABS: 1.0000 | ORAL_TABLET | ORAL | 0 refills | Status: DC | PRN

## 2018-06-10 MED ORDER — HYDROCODONE-ACETAMINOPHEN 5-325 MG PO TABS
1.0000 | ORAL_TABLET | Freq: Once | ORAL | Status: AC
  Administered 2018-06-10: 1 via ORAL
  Filled 2018-06-10: qty 1

## 2018-06-10 MED ORDER — IBUPROFEN 600 MG PO TABS: 600.0000 mg | ORAL_TABLET | Freq: Three times a day (TID) | ORAL | 0 refills | Status: DC | PRN

## 2018-06-10 MED ORDER — IBUPROFEN 600 MG PO TABS
600.0000 mg | ORAL_TABLET | Freq: Once | ORAL | Status: AC
  Administered 2018-06-10: 600 mg via ORAL
  Filled 2018-06-10: qty 1

## 2018-06-10 NOTE — Discharge Instructions (Signed)
Fortunately today your lab work was reassuring.  Your ultrasound does show a large right-sided ovarian cyst that needs to be reevaluated by your OB gynecologist.  It does not look like it is torsed today but a cyst size certainly could become twisted.  Return immediately to the emergency department if your symptoms become intermittent the way we discussed earlier today.  It was a pleasure to take care of you today, and thank you for coming to our emergency department.  If you have any questions or concerns before leaving please ask the nurse to grab me and I'm more than happy to go through your aftercare instructions again.  If you were prescribed any opioid pain medication today such as Norco, Vicodin, Percocet, morphine, hydrocodone, or oxycodone please make sure you do not drive when you are taking this medication as it can alter your ability to drive safely.  If you have any concerns once you are home that you are not improving or are in fact getting worse before you can make it to your follow-up appointment, please do not hesitate to call 911 and come back for further evaluation.  Merrily Brittle, MD  Results for orders placed or performed during the hospital encounter of 06/10/18  Lipase, blood  Result Value Ref Range   Lipase 40 11 - 51 U/L  Comprehensive metabolic panel  Result Value Ref Range   Sodium 139 135 - 145 mmol/L   Potassium 3.9 3.5 - 5.1 mmol/L   Chloride 106 98 - 111 mmol/L   CO2 26 22 - 32 mmol/L   Glucose, Bld 94 70 - 99 mg/dL   BUN 15 6 - 20 mg/dL   Creatinine, Ser 1.61 0.44 - 1.00 mg/dL   Calcium 8.7 (L) 8.9 - 10.3 mg/dL   Total Protein 6.0 (L) 6.5 - 8.1 g/dL   Albumin 3.6 3.5 - 5.0 g/dL   AST 21 15 - 41 U/L   ALT 15 0 - 44 U/L   Alkaline Phosphatase 47 38 - 126 U/L   Total Bilirubin 0.5 0.3 - 1.2 mg/dL   GFR calc non Af Amer >60 >60 mL/min   GFR calc Af Amer >60 >60 mL/min   Anion gap 7 5 - 15  CBC  Result Value Ref Range   WBC 9.6 4.0 - 10.5 K/uL   RBC  4.56 3.87 - 5.11 MIL/uL   Hemoglobin 13.7 12.0 - 15.0 g/dL   HCT 09.6 04.5 - 40.9 %   MCV 89.9 80.0 - 100.0 fL   MCH 30.0 26.0 - 34.0 pg   MCHC 33.4 30.0 - 36.0 g/dL   RDW 81.1 91.4 - 78.2 %   Platelets 218 150 - 400 K/uL   nRBC 0.0 0.0 - 0.2 %  Urinalysis, Complete w Microscopic  Result Value Ref Range   Color, Urine YELLOW (A) YELLOW   APPearance HAZY (A) CLEAR   Specific Gravity, Urine 1.025 1.005 - 1.030   pH 5.0 5.0 - 8.0   Glucose, UA NEGATIVE NEGATIVE mg/dL   Hgb urine dipstick NEGATIVE NEGATIVE   Bilirubin Urine NEGATIVE NEGATIVE   Ketones, ur NEGATIVE NEGATIVE mg/dL   Protein, ur NEGATIVE NEGATIVE mg/dL   Nitrite NEGATIVE NEGATIVE   Leukocytes, UA NEGATIVE NEGATIVE   RBC / HPF 0-5 0 - 5 RBC/hpf   WBC, UA 0-5 0 - 5 WBC/hpf   Bacteria, UA NONE SEEN NONE SEEN   Squamous Epithelial / LPF 0-5 0 - 5   Mucus PRESENT   Pregnancy, urine  POC  Result Value Ref Range   Preg Test, Ur NEGATIVE NEGATIVE   US Transvaginal Non-ob  Result Date: 06/10/2018 CLINICAL DATA:  Right lower quadrant pain for months. History of ovarian cyst. No menstrual cycle since August. EXAM: TRANSABDOMINAL AND TRANSVAGINAL ULTRASOUND OF PELVIS DOPPLER ULTRASOUND OF OVARIES TECHNIQUE: Both transabdominal and transvaginal ultrasound examinations of the pelvis were performed. Transabdominal technique was performed for global imaging of the pelvis including uterus, ovaries, adnexal regions, and pelvic cul-de-sac. It was necessary to proceed with endovaginal exam following the transabdominal exam to visualize the ovaries and endometrium. Color and duplex Doppler ultrasound was utilized to evaluate blood flow to the ovaries. COMPARISON:  Ob ultrasound 08/14/2017 FINDINGS: Uterus Measurements: 7.9 x 3.8 x 4.5 cm = volume: 70.54 mL. No fibroids or other mass visualized. Endometrium Thickness: 6 mm.  No focal abnormality visualized. Right ovary Measurements: 7 x 6.5 x 5.6 cm = volume: 132 mL mL. Complex cystic mass with  septation and internal debris measuring 6.1 x 4.9 x 6.1 cm. No flow demonstrated in the mass on color flow Doppler imaging. Left ovary Measurements: 3.1 x 2.5 x 2.4 cm = volume: 9.8 mL. Normal appearance/no adnexal mass. Pulsed Doppler evaluation of both ovaries demonstrates normal low-resistance arterial and venous waveforms. Other findings Moderate free fluid in the pelvis. IMPRESSION: 1. Uterus and endometrium are unremarkable. 2. Complex cystic mass with septation and internal debris in the right ovary measuring up to 6.1 cm diameter. Probably a hemorrhagic cyst but due to size, follow-up in 6-12 weeks is recommended to ensure resolution. 3. Moderate free pelvic fluid. Electronically Signed   By: Burman Nieves M.D.   On: 06/10/2018 04:37   US Pelvis Complete  Result Date: 06/10/2018 CLINICAL DATA:  Right lower quadrant pain for months. History of ovarian cyst. No menstrual cycle since August. EXAM: TRANSABDOMINAL AND TRANSVAGINAL ULTRASOUND OF PELVIS DOPPLER ULTRASOUND OF OVARIES TECHNIQUE: Both transabdominal and transvaginal ultrasound examinations of the pelvis were performed. Transabdominal technique was performed for global imaging of the pelvis including uterus, ovaries, adnexal regions, and pelvic cul-de-sac. It was necessary to proceed with endovaginal exam following the transabdominal exam to visualize the ovaries and endometrium. Color and duplex Doppler ultrasound was utilized to evaluate blood flow to the ovaries. COMPARISON:  Ob ultrasound 08/14/2017 FINDINGS: Uterus Measurements: 7.9 x 3.8 x 4.5 cm = volume: 70.54 mL. No fibroids or other mass visualized. Endometrium Thickness: 6 mm.  No focal abnormality visualized. Right ovary Measurements: 7 x 6.5 x 5.6 cm = volume: 132 mL mL. Complex cystic mass with septation and internal debris measuring 6.1 x 4.9 x 6.1 cm. No flow demonstrated in the mass on color flow Doppler imaging. Left ovary Measurements: 3.1 x 2.5 x 2.4 cm = volume: 9.8 mL.  Normal appearance/no adnexal mass. Pulsed Doppler evaluation of both ovaries demonstrates normal low-resistance arterial and venous waveforms. Other findings Moderate free fluid in the pelvis. IMPRESSION: 1. Uterus and endometrium are unremarkable. 2. Complex cystic mass with septation and internal debris in the right ovary measuring up to 6.1 cm diameter. Probably a hemorrhagic cyst but due to size, follow-up in 6-12 weeks is recommended to ensure resolution. 3. Moderate free pelvic fluid. Electronically Signed   By: Burman Nieves M.D.   On: 06/10/2018 04:37   [place] Art/ven Flow Abd Pelv Doppler  Result Date: 06/10/2018 CLINICAL DATA:  Right lower quadrant pain for months. History of ovarian cyst. No menstrual cycle since August. EXAM: TRANSABDOMINAL AND TRANSVAGINAL ULTRASOUND OF PELVIS DOPPLER ULTRASOUND  OF OVARIES TECHNIQUE: Both transabdominal and transvaginal ultrasound examinations of the pelvis were performed. Transabdominal technique was performed for global imaging of the pelvis including uterus, ovaries, adnexal regions, and pelvic cul-de-sac. It was necessary to proceed with endovaginal exam following the transabdominal exam to visualize the ovaries and endometrium. Color and duplex Doppler ultrasound was utilized to evaluate blood flow to the ovaries. COMPARISON:  Ob ultrasound 08/14/2017 FINDINGS: Uterus Measurements: 7.9 x 3.8 x 4.5 cm = volume: 70.54 mL. No fibroids or other mass visualized. Endometrium Thickness: 6 mm.  No focal abnormality visualized. Right ovary Measurements: 7 x 6.5 x 5.6 cm = volume: 132 mL mL. Complex cystic mass with septation and internal debris measuring 6.1 x 4.9 x 6.1 cm. No flow demonstrated in the mass on color flow Doppler imaging. Left ovary Measurements: 3.1 x 2.5 x 2.4 cm = volume: 9.8 mL. Normal appearance/no adnexal mass. Pulsed Doppler evaluation of both ovaries demonstrates normal low-resistance arterial and venous waveforms. Other findings Moderate free  fluid in the pelvis. IMPRESSION: 1. Uterus and endometrium are unremarkable. 2. Complex cystic mass with septation and internal debris in the right ovary measuring up to 6.1 cm diameter. Probably a hemorrhagic cyst but due to size, follow-up in 6-12 weeks is recommended to ensure resolution. 3. Moderate free pelvic fluid. Electronically Signed   By: Burman Nieves M.D.   On: 06/10/2018 04:37

## 2018-06-10 NOTE — ED Provider Notes (Signed)
[facility] Emergency Department Provider Note  ____________________________________________   First MD Initiated Contact with Patient 06/10/18 0308     (approximate)  I have reviewed the triage vital signs and the nursing notes.   HISTORY  Chief Complaint Abdominal Pain   HPI Alexa Guerra is a 27 y.o. female who comes to the emergency department with an acute exacerbation of chronic right lower quadrant abdominal pain.  She says she has had nearly daily right lower quadrant pain for the past several months however for the past 2 days the pain is become acutely worse.  The pain is moderate in severity described as throbbing and aching.  It is not intermittent.  It is currently constant.  Associated with some nausea but no vomiting.  No diarrhea.  She does have a known ovarian cyst on the right.  She had a miscarriage several months ago and was concerned that she could be having another however she checked a home pregnancy test which was negative.  She has no history of abdominal surgeries.    Past Medical History:  Diagnosis Date  . Anxiety   . Bipolar disorder (HCC)   . Depression   . Double aortic arch   . Double aortic arch   . Double aortic arch    w/ ring  . Ectopic pregnancy 08/2017  . Hx of varicella   . Hypothyroidism   . Ovarian cyst     Patient Active Problem List   Diagnosis Date Noted  . Dyspareunia, female 11/13/2017  . Preoperative clearance 11/09/2017  . 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 Right ovarian cyst 10/16/2017  . Right lower quadrant abdominal pain 10/16/2017  . Ectopic pregnancy 08/14/2017    Past Surgical History:  Procedure Laterality Date  . NO PAST SURGERIES    . WISDOM TOOTH EXTRACTION  2009   all four    Prior to Admission medications   Medication Sig Start Date End Date Taking? Authorizing Provider  FLUoxetine (PROZAC) 40 MG capsule Take by mouth every evening.  10/29/17   [provider]  ibuprofen (ADVIL,MOTRIN) 600 MG tablet Take 1 tablet (600 mg total) by mouth every 8 (eight) hours as needed. 06/10/18   Merrily Brittle, MD  lamoTRIgine (LAMICTAL) 25 MG tablet TAKE 1 TABLET BY MOUTH EVERY DAY FOR 2 WKS, THEN 2 TABS DAILY FOR 2 WEEKS, THEN FOLLOWUP FOR RECHECK 10/26/17   [provider]  methimazole (TAPAZOLE) 5 MG tablet Take 5 mg by mouth daily. 10/29/17   [provider]  Multiple Vitamin (MULTIVITAMIN) tablet Take 1 tablet by mouth daily.    [provider]  OLANZapine (ZYPREXA) 10 MG tablet TAKE 1 TABLET BY MOUTH EVERY DAY **DISCONTINUE OLANZAPINE-FLUOXETINE** 10/29/17   [provider]  ondansetron (ZOFRAN ODT) 4 MG disintegrating tablet Take 1 tablet (4 mg total) by mouth every 8 (eight) hours as needed for nausea or vomiting. 06/10/18   Merrily Brittle, MD  oxyCODONE-acetaminophen (PERCOCET/ROXICET) 5-325 MG tablet Take 1 tablet by mouth every 4 (four) hours as needed for severe pain. 06/10/18   Merrily Brittle, MD    Allergies Other and Beta adrenergic blockers  Family History  Problem Relation Age of Onset  . Drug abuse Mother   . Cancer Mother        cervix  . Alcohol abuse Father   . Hypertension Maternal Grandmother   . Diabetes Maternal Grandmother   . Hypertension Maternal Grandfather   . Diabetes Maternal Grandfather   .  Hypertension Paternal Grandmother   . Hypertension Paternal Grandfather     Social History Social History   Tobacco Use  . Smoking status: Current Every Day Smoker    Packs/day: 0.25    Types: Cigarettes  . Smokeless tobacco: Never Used  Substance Use Topics  . Alcohol use: Yes    Comment: Occasionally  . Drug use: No    Review of Systems Constitutional: No fever/chills Eyes: No visual changes. ENT: No sore throat. Cardiovascular: Denies chest pain. Respiratory: Denies shortness of breath. Gastrointestinal: Positive for abdominal pain.  Positive for nausea, no vomiting.  No  diarrhea.  No constipation. Genitourinary: Negative for dysuria. Musculoskeletal: Negative for back pain. Skin: Negative for rash. Neurological: Negative for headaches, focal weakness or numbness.   ____________________________________________   PHYSICAL EXAM:  VITAL SIGNS: ED Triage Vitals  Enc Vitals Group     BP 06/09/18 2230 121/66     Pulse Rate 06/09/18 2230 83     Resp 06/09/18 2230 16     Temp 06/09/18 2230 97.6 F (36.4 C)     Temp Source 06/09/18 2230 Oral     SpO2 06/09/18 2230 100 %     Weight 06/09/18 2232 145 lb (65.8 kg)     Height 06/09/18 2232 5\' 4"  (1.626 m)     Head Circumference --      Peak Flow --      Pain Score 06/09/18 2235 9     Pain Loc --      Pain Edu? --      Excl. in GC? --     Constitutional: Alert and oriented x4 nontoxic no diaphoresis somewhat uncomfortable appearing Eyes: PERRL EOMI. Head: Atraumatic. Nose: No congestion/rhinnorhea. Mouth/Throat: No trismus Neck: No stridor.   Cardiovascular: Normal rate, regular rhythm. Grossly normal heart sounds.  Good peripheral circulation. Respiratory: Normal respiratory effort.  No retractions. Lungs CTAB and moving good air Gastrointestinal: Soft abdomen she is tender in the right lower quadrant although not over McBurney's.  Mild rebound although no frank peritonitis negative Rovsing's Musculoskeletal: No lower extremity edema   Neurologic:  Normal speech and language. No gross focal neurologic deficits are appreciated. Skin:  Skin is warm, dry and intact. No rash noted. Psychiatric: Mood and affect are normal. Speech and behavior are normal.    ____________________________________________   DIFFERENTIAL includes but not limited to  Appendicitis, urinary tract infection, PID, ovarian torsion, ovarian cyst ____________________________________________   LABS (all labs ordered are listed, but only abnormal results are displayed)  Labs Reviewed  COMPREHENSIVE METABOLIC PANEL -  Abnormal; Notable for the following components:      Result Value   Calcium 8.7 (*)    Total Protein 6.0 (*)    All other components within normal limits  URINALYSIS, COMPLETE (UACMP) WITH MICROSCOPIC - Abnormal; Notable for the following components:   Color, Urine YELLOW (*)    APPearance HAZY (*)    All other components within normal limits  LIPASE, BLOOD  CBC  POC URINE PREG, ED  POCT PREGNANCY, URINE    Lab work reviewed by me confirms the patient is not pregnant __________________________________________  EKG   ____________________________________________  RADIOLOGY  Pelvic ultrasound reviewed by me shows 6 cm cyst on her right ovary that appears to have hemorrhaged.  She does have good flow ____________________________________________   PROCEDURES  Procedure(s) performed: no  Procedures  Critical Care performed: no  ____________________________________________   INITIAL IMPRESSION / ASSESSMENT AND PLAN / ED COURSE  Pertinent labs &  imaging results that were available during my care of the patient were reviewed by me and considered in my medical decision making (see chart for details).   As part of my medical decision making, I reviewed the following data within the electronic MEDICAL RECORD NUMBER History obtained from family if available, nursing notes, old chart and ekg, as well as notes from prior ED visits.  The patient comes to the emergency department with an acute exacerbation of her chronic right lower quadrant abdominal pain.  She is not pregnant.  Lab work is reassuring.  Given Vicodin and ibuprofen with some improvement in her symptoms.  Given her known previous ovarian cyst we will begin with a pelvic ultrasound.  Her symptoms are not intermittent and do not sound like ovarian torsion.  The patient's pain is improved.  Pelvic ultrasound shows a large complex cyst on the right that appears to have hemorrhaged along with some pelvic free fluid.  She is  tolerating orals and is not frankly peritoneal it.  Had a lengthy discussion with the patient regarding her findings and the importance of close [facility] gynecology follow-up.  I explained that while it appears she is not having ovarian torsion at this time a 6 cm cyst is certainly a risk factor for future torsion.  She understands to return immediately should her pain worsen, if she cannot eat or drink, or if it becomes colicky.  I will discharge her home with a short course of Percocet ibuprofen and Zofran.  Strict return precautions have been given.      ____________________________________________   FINAL CLINICAL IMPRESSION(S) / ED DIAGNOSES  Final diagnoses:  Ruptured ovarian cyst      NEW MEDICATIONS STARTED DURING THIS VISIT:  Discharge Medication List as of 06/10/2018  5:15 AM    START taking these medications   Details  ibuprofen (ADVIL,MOTRIN) 600 MG tablet Take 1 tablet (600 mg total) by mouth every 8 (eight) hours as needed., Starting Mon 06/10/2018, Print    ondansetron (ZOFRAN ODT) 4 MG disintegrating tablet Take 1 tablet (4 mg total) by mouth every 8 (eight) hours as needed for nausea or vomiting., Starting Mon 06/10/2018, Print    oxyCODONE-acetaminophen (PERCOCET/ROXICET) 5-325 MG tablet Take 1 tablet by mouth every 4 (four) hours as needed for severe pain., Starting Mon 06/10/2018, Print         Note:  This document was prepared using Dragon voice recognition software and may include unintentional dictation errors.     Merrily Brittle, MD 06/12/18 [phone]

## 2018-06-14 ENCOUNTER — Ambulatory Visit (INDEPENDENT_AMBULATORY_CARE_PROVIDER_SITE_OTHER): Payer: Medicaid Other | Admitting: Obstetrics and Gynecology

## 2018-06-14 ENCOUNTER — Encounter: Payer: Self-pay | Admitting: Obstetrics and Gynecology

## 2018-06-14 VITALS — BP 122/74 | Ht 64.0 in | Wt 150.0 lb

## 2018-06-14 DIAGNOSIS — Z30011 Encounter for initial prescription of contraceptive pills: Secondary | ICD-10-CM | POA: Diagnosis not present

## 2018-06-14 DIAGNOSIS — R1031 Right lower quadrant pain: Secondary | ICD-10-CM | POA: Diagnosis not present

## 2018-06-14 DIAGNOSIS — N83201 Unspecified ovarian cyst, right side: Secondary | ICD-10-CM | POA: Diagnosis not present

## 2018-06-14 MED ORDER — HYDROCODONE-ACETAMINOPHEN 5-325 MG PO TABS: 1.0000 | ORAL_TABLET | Freq: Four times a day (QID) | ORAL | 0 refills | Status: DC | PRN

## 2018-06-14 MED ORDER — NORETHINDRONE 0.35 MG PO TABS: 1.0000 | ORAL_TABLET | Freq: Every day | ORAL | 11 refills | Status: DC

## 2018-06-14 NOTE — Progress Notes (Signed)
Obstetrics & Gynecology Office Visit    Chief Complaint  Patient presents with  . Follow-up    ER Follow Up  Right lower quadrant abdominal pain and right ovarian cyst  History of Present Illness: 27 y.o. [id number] female who presents in follow up from the ER for right lower quadrant abdominal pain. The pain has been constant for the past few months.  The pain is in the right lower quadrant.  She describes the pain as a constant pressure.  She rates the pain as a 6/10 today. Normally, the pain is worse.  The pain occasionally radiates to her left side.  Aggravating factors include; movement, laughing, pushing on the area. Sex also makes the pain worse.  Alleviating factors: nothing.  She has tried medications, body positions, heating pad. Associated symptoms: headaches, back pain, nausea (with occasional nausea). She has urinary frequency.  She denies diarrhea and constipation.  Her last menstrual period was in August this year.      She went to the ER on 11/4 and was evaluated. She has had two negative pregnancy tests so far.  An ultrasound in the ER showed a right ovarian cyst, complex with a septation, no internal flow on doppler, measuring 6.1 x 4.9 x 6.1 cm.  She has a history of right ovarian cysts this year.  In March a pelvic ultrasound showed a right hemorrhagic cyst measuring 6.5 x 5.3 cm.  In April the cyst measured 2.5 x 2 cm on repeat ultrasound.  In January, she had a 2.5 x 2.0 x 2.1 cm, but this was an ectopic pregnancy that resolved without surgery.   Her cardiologist is Dr. Allyson Sabal and she was cleared for surgery in April, but her surgery was cancelled.   She does have a history of migraine headaches without aura.       Past Medical History:  Diagnosis Date  . Anxiety   . Bipolar disorder (HCC)   . Depression   . Double aortic arch   . Double aortic arch   . Double aortic arch    w/ ring  . Ectopic pregnancy 08/2017  . Hx of varicella   . Hypothyroidism   . Ovarian cyst     Past Surgical History:  Procedure Laterality Date  . WISDOM TOOTH EXTRACTION  2009   all four   Gynecologic History: Patient's last menstrual period was 03/14/2018.  Obstetric History: [id number]  Family History  Problem Relation Age of Onset  . Drug abuse Mother   . Cancer Mother        cervix  . Alcohol abuse Father   . Hypertension Maternal Grandmother   . Diabetes Maternal Grandmother   . Hypertension Maternal Grandfather   . Diabetes Maternal Grandfather   . Hypertension Paternal Grandmother   . Hypertension Paternal Grandfather     Social History   Socioeconomic History  . Marital status: Single    Spouse name: Not on file  . Number of children: Not on file  . Years of education: Not on file  . Highest education level: Not on file  Occupational History  . Not on file  Social Needs  . Financial resource strain: Not on file  . Food insecurity:    Worry: Not on file    Inability: Not on file  . Transportation needs:    Medical: Not on file    Non-medical: Not on file  Tobacco Use  . Smoking status: Current Every Day Smoker    Packs/day: 0.25  Types: Cigarettes  . Smokeless tobacco: Never Used  Substance and Sexual Activity  . Alcohol use: Yes    Comment: Occasionally  . Drug use: No  . Sexual activity: Yes    Birth control/protection: None  Lifestyle  . Physical activity:    Days per week: Not on file    Minutes per session: Not on file  . Stress: Not on file  Relationships  . Social connections:    Talks on phone: Not on file    Gets together: Not on file    Attends religious service: Not on file    Active member of club or organization: Not on file    Attends meetings of clubs or organizations: Not on file    Relationship status: Not on file  . Intimate partner violence:    Fear of current or ex partner: Not on file    Emotionally abused: Not on file    Physically abused: Not on file    Forced sexual activity: Not on file  Other Topics  Concern  . Not on file  Social History Narrative  . Not on file    Allergies  Allergen Reactions  . Other Other (See Comments)    Pt reports a reaction "vital sign changes" to a beta blocker. She does not know the name of the medication.  . Beta Adrenergic Blockers Other (See Comments)    Vitals dropped "I was pretty much dying"    Prior to Admission medications   Medication Sig Start Date End Date Taking? Authorizing Provider  ibuprofen (ADVIL,MOTRIN) 600 MG tablet Take 1 tablet (600 mg total) by mouth every 8 (eight) hours as needed. 06/10/18  Yes Merrily Brittle, MD  ondansetron (ZOFRAN ODT) 4 MG disintegrating tablet Take 1 tablet (4 mg total) by mouth every 8 (eight) hours as needed for nausea or vomiting. 06/10/18  Yes Merrily Brittle, MD  FLUoxetine (PROZAC) 40 MG capsule Take by mouth every evening.  10/29/17   [provider]  lamoTRIgine (LAMICTAL) 25 MG tablet TAKE 1 TABLET BY MOUTH EVERY DAY FOR 2 WKS, THEN 2 TABS DAILY FOR 2 WEEKS, THEN FOLLOWUP FOR RECHECK 10/26/17   [provider]  methimazole (TAPAZOLE) 5 MG tablet Take 5 mg by mouth daily. 10/29/17   [provider]  Multiple Vitamin (MULTIVITAMIN) tablet Take 1 tablet by mouth daily.    [provider]  OLANZapine (ZYPREXA) 10 MG tablet TAKE 1 TABLET BY MOUTH EVERY DAY **DISCONTINUE OLANZAPINE-FLUOXETINE** 10/29/17   [provider]  oxyCODONE-acetaminophen (PERCOCET/ROXICET) 5-325 MG tablet Take 1 tablet by mouth every 4 (four) hours as needed for severe pain. Patient not taking: Reported on 06/14/2018 06/10/18   Merrily Brittle, MD    Review of Systems  Constitutional: Negative.   HENT: Negative.   Eyes: Negative.   Respiratory: Negative.   Cardiovascular: Negative.   Gastrointestinal: Positive for abdominal pain and nausea. Negative for blood in stool, constipation, diarrhea, heartburn, melena and vomiting.  Genitourinary: Negative.   Musculoskeletal: Negative.   Skin:  Negative.   Neurological: Negative.   Psychiatric/Behavioral: Negative.      Physical Exam BP 122/74   Ht 5\' 4"  (1.626 m)   Wt 150 lb (68 kg)   LMP 03/14/2018   BMI 25.75 kg/m  Patient's last menstrual period was 03/14/2018. Physical Exam  Constitutional: She is oriented to person, place, and time. She appears well-developed and well-nourished. No distress.  Eyes: EOM are normal. No scleral icterus.  Neck: Normal range of motion.  Neck supple.  Cardiovascular: Normal rate and regular rhythm.  Pulmonary/Chest: Effort normal and breath sounds normal. No respiratory distress. She has no wheezes. She has no rales.  Abdominal: Soft. Bowel sounds are normal. She exhibits no distension and no mass. There is tenderness (mild in RLQ). There is no rebound and no guarding.  Musculoskeletal: Normal range of motion. She exhibits no edema.  Neurological: She is alert and oriented to person, place, and time. No cranial nerve deficit.  Skin: Skin is warm and dry. No erythema.  Psychiatric: She has a normal mood and affect. Her behavior is normal. Judgment normal.   Assessment: 27 y.o. [id number] female here for  1. Right lower quadrant abdominal pain   2. Right ovarian cyst   3. Encounter for initial prescription of contraceptive pills      Plan: Problem List Items Addressed This Visit      Endocrine   Right ovarian cyst   Relevant Orders   US PELVIS TRANSVANGINAL NON-OB (TV ONLY)     Other   Right lower quadrant abdominal pain - Primary   Relevant Medications   HYDROcodone-acetaminophen (NORCO) 5-325 MG tablet    Other Visit Diagnoses    Encounter for initial prescription of contraceptive pills       Relevant Medications   norethindrone (MICRONOR,CAMILA,ERRIN) 0.35 MG tablet     Right ovarian cyst: She was given precautions for ovarian torsion symptoms.  If necessary, she has been cleared by her cardiologist for the surgery that would be required to treat her cyst.  However, would like  to avoid this if possible.  If she does not have concerning symptoms in the meantime, we will repeat ultrasound in 2 to 3 months to assess resolution of her cyst.  As noted above, she had a cyst earlier in the year that may or may not be the same cyst.  The other cyst was in the state of actively resolving when last checked in April.  Right lower right lower quadrant abdominal pain: Precautions given for severe worsening pain.  She was also provided a small prescription for pain medication.  Contraceptive management: We briefly discussed her lack of regular periods.  She has a tendency to form cysts, as well.  She has not tolerated other forms of progesterone only contraception in the past (Nexplanon).  We will try progesterone only pills.  I did discuss with her that about 50% of women will continue to ovulate with this medication, but we have no short-term alternatives at the moment.  If she fails this treatment, we could consider a longer acting agent that will suppress ovulation.  She agrees to this plan.  Health maintenance: She is long overdue for a Pap smear and general examination.  I recommended that at her follow-up ultrasound visit that we perform an annual exam with Pap smear at that time.  Thomasene Mohair, MD 06/14/2018 10:34 AM

## 2018-08-15 ENCOUNTER — Other Ambulatory Visit: Payer: Self-pay

## 2018-08-15 ENCOUNTER — Ambulatory Visit: Payer: Self-pay | Admitting: Obstetrics and Gynecology

## 2018-08-16 ENCOUNTER — Ambulatory Visit: Payer: Self-pay

## 2018-08-16 ENCOUNTER — Ambulatory Visit: Payer: Self-pay | Admitting: Obstetrics and Gynecology

## 2018-10-11 IMAGING — US US OB COMP LESS 14 WK
1 series · 13 of 28 positions shown · non-contrast
Comparison: None.

CLINICAL DATA: First-trimester pregnancy with vaginal bleeding for
1 hour.

EXAM:
OBSTETRIC <14 WK US AND TRANSVAGINAL OB US
TECHNIQUE: Both transabdominal and transvaginal ultrasound examinations were
performed for complete evaluation of the gestation as well as the
maternal uterus, adnexal regions, and pelvic cul-de-sac.
Transvaginal technique was performed to assess early pregnancy.

[Series 1: us ob comp less 14 wk · 0.20mm/px · 102 acquisitions, 13 frames shown]
[im 4/102]
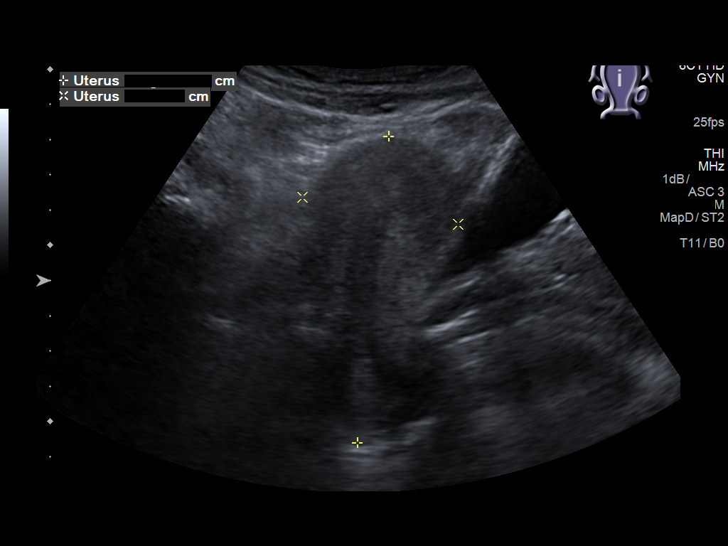
[im 12/102]
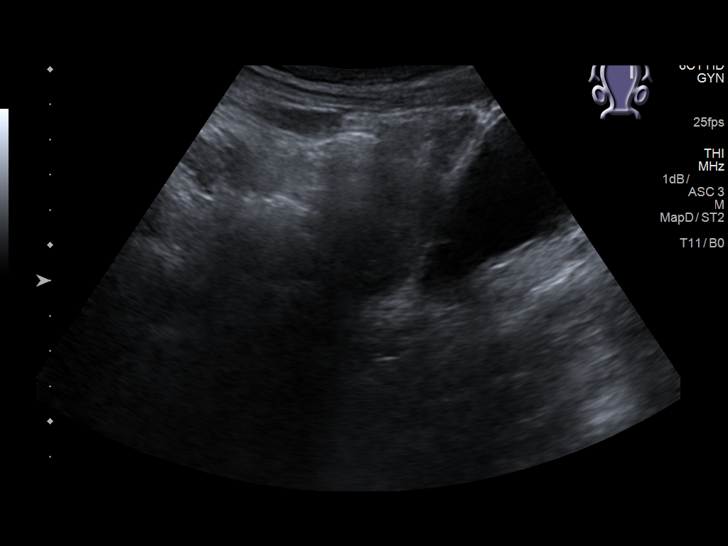
[im 19/102]
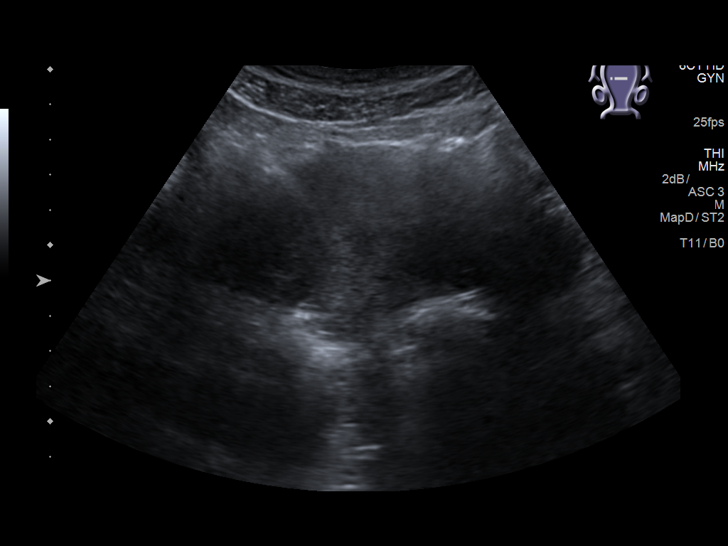
[im 27/102]
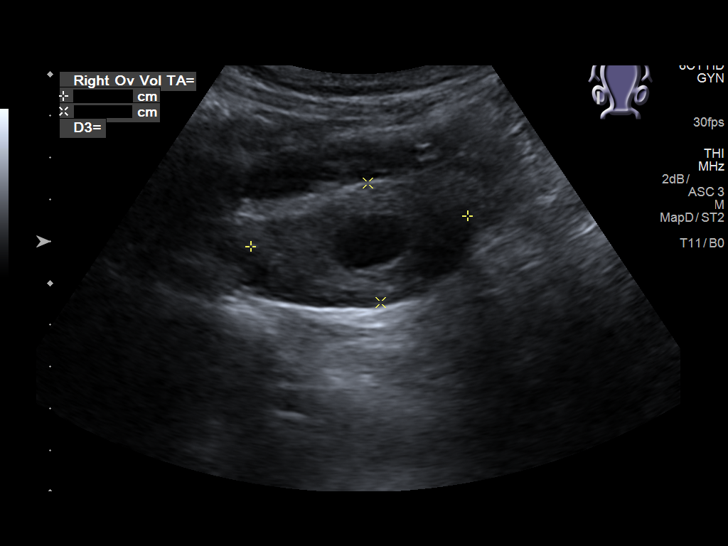
[im 34/102]
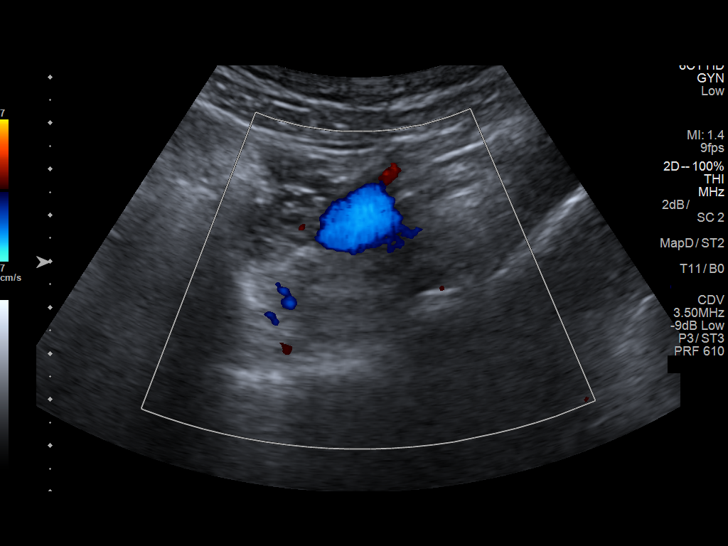
[im 42/102]
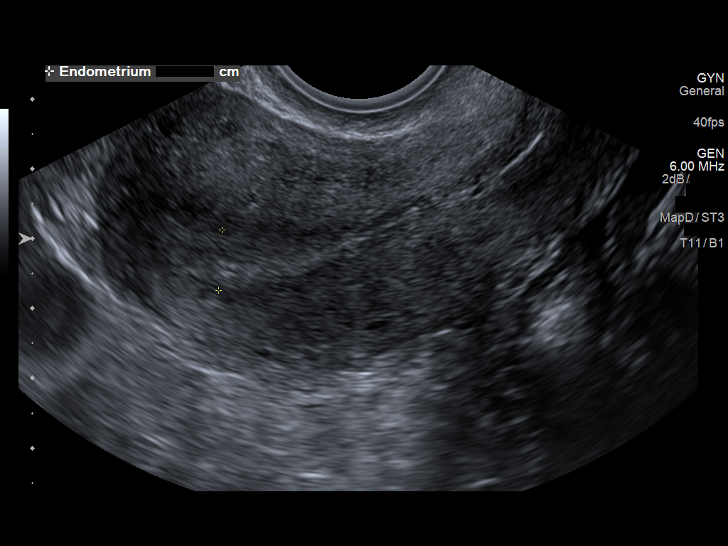
[im 53/102]
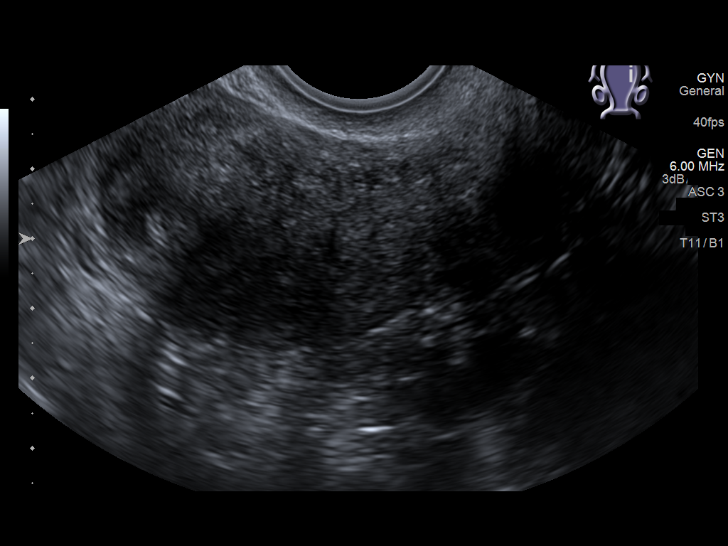
[im 60/102]
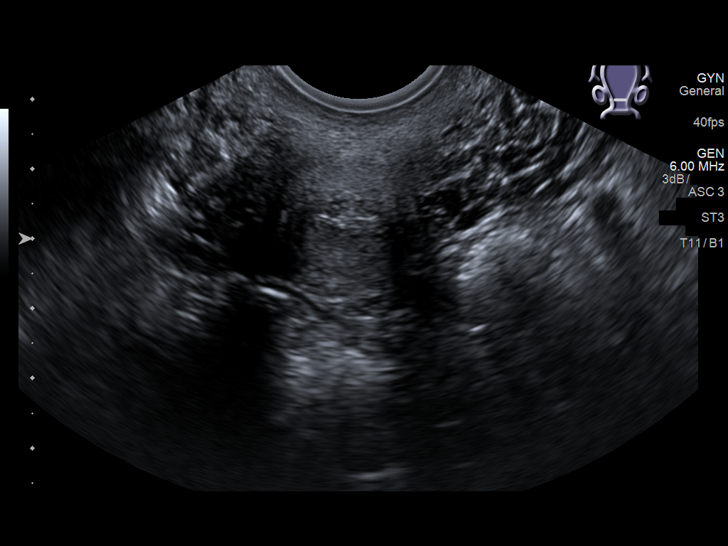
[im 68/102]
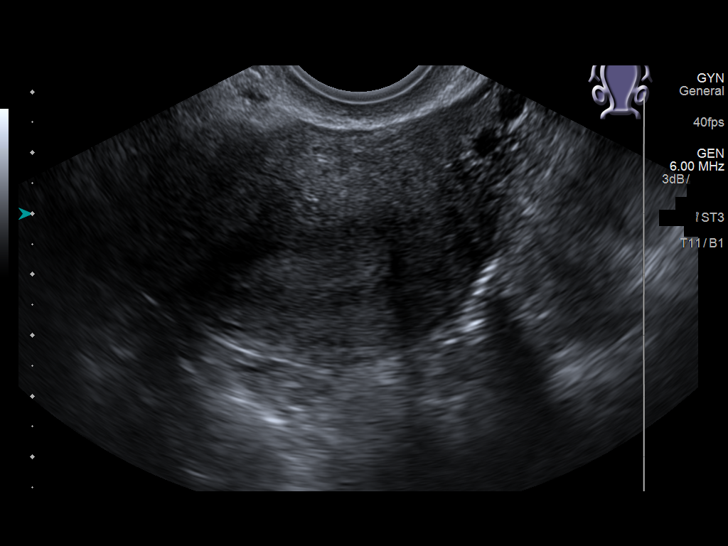
[im 75/102]
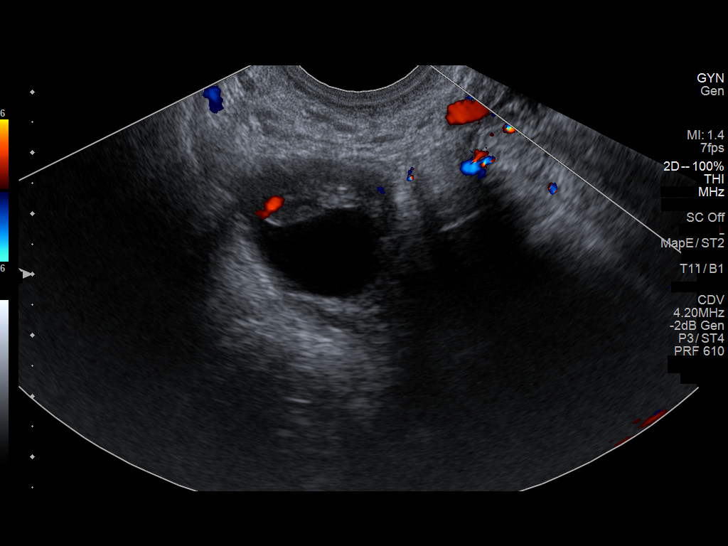
[im 83/102]
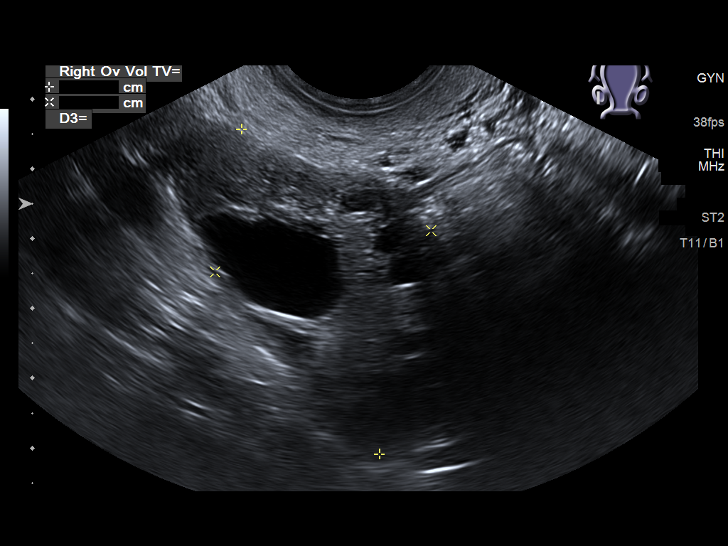
[im 90/102]
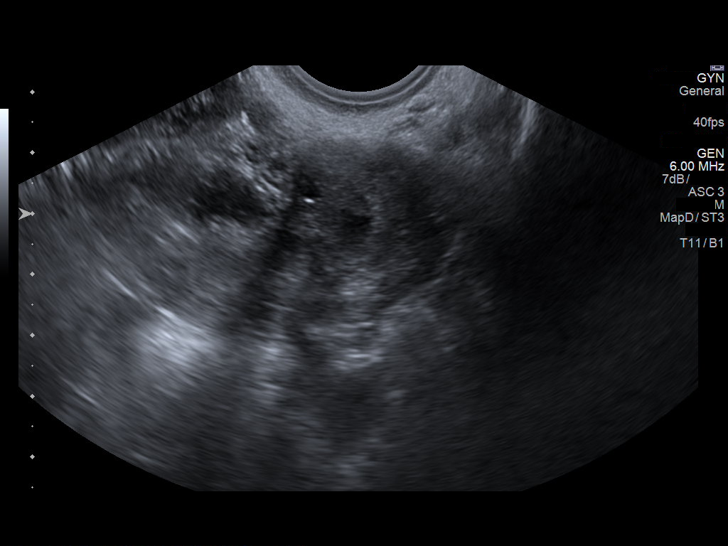
[im 98/102]
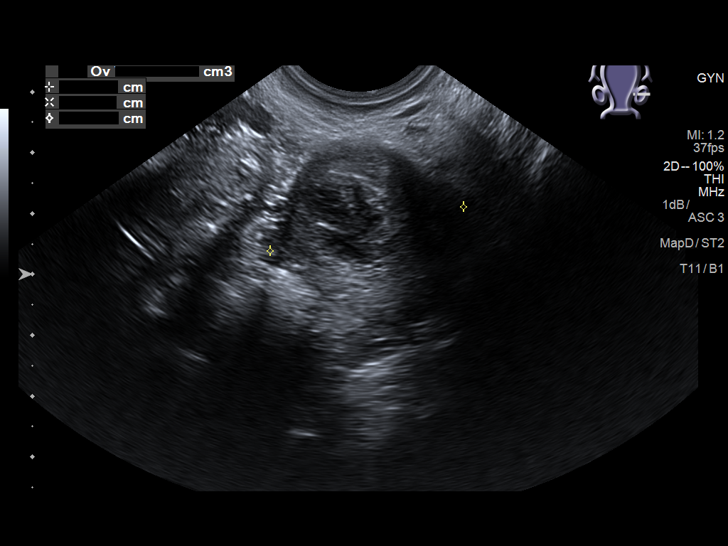

[13 of 28 positions shown; findings below may reference images not displayed]

FINDINGS: No intrauterine gestational sac or cyst is seen. Reportedly the
patient has had minimal spotting and presented for is diffuse pelvic
pain which is increasing. In the left adnexa is a complex cystic
mass measuring up to 2.1 cm. A discrete left ovary is not identified
to determine if this is intra- or extraovarian. Cyst/ follicle in
the right ovary measuring up to 27 mm. Pelvic fluid that is small
volume simple appearing.

These results were called by telephone at the time of interpretation
on 08/09/2017 at [DATE] to Dr. PIMENT GABON , who verbally
acknowledged these results.
IMPRESSION: 1. Complex cystic mass in the left adnexa measuring up to 2.1 cm, a
discrete left ovary is not identified to determine if this is intra-
or extra-ovarian. Given beta and lack of intrauterine gestational
sac, findings are primarily concerning for a left ectopic pregnancy.
2. Small volume simple appearing pelvic fluid.

## 2018-10-16 IMAGING — US US OB TRANSVAGINAL
1 series · 13 of 28 positions shown · non-contrast
Comparison: 08/14/2017.

CLINICAL DATA: Worsening pain. Methotrexate treatment yesterday for
ectopic pregnancy.

EXAM:
OBSTETRIC <14 WK US AND TRANSVAGINAL OB US
TECHNIQUE: Both transabdominal and transvaginal ultrasound examinations were
performed for complete evaluation of the gestation as well as the
maternal uterus, adnexal regions, and pelvic cul-de-sac.
Transvaginal technique was performed to assess early pregnancy.

[Series 1: us ob transvaginal · 0.19mm/px · 13 of 121 slices shown]
[im 5/121]
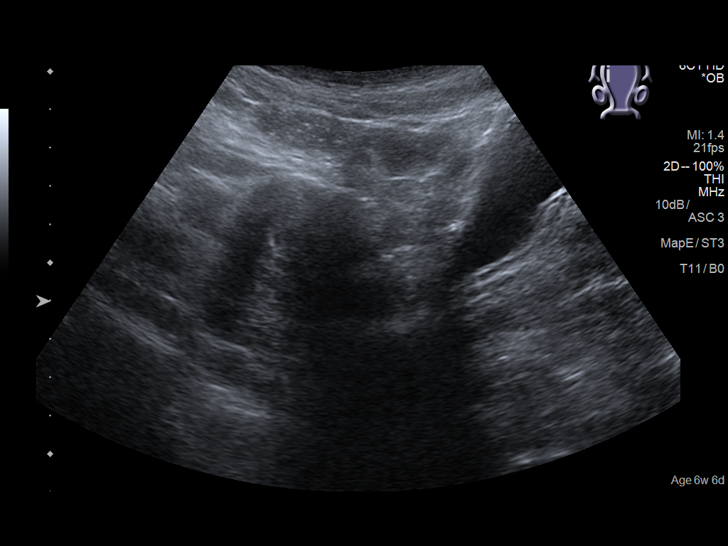
[im 14/121]
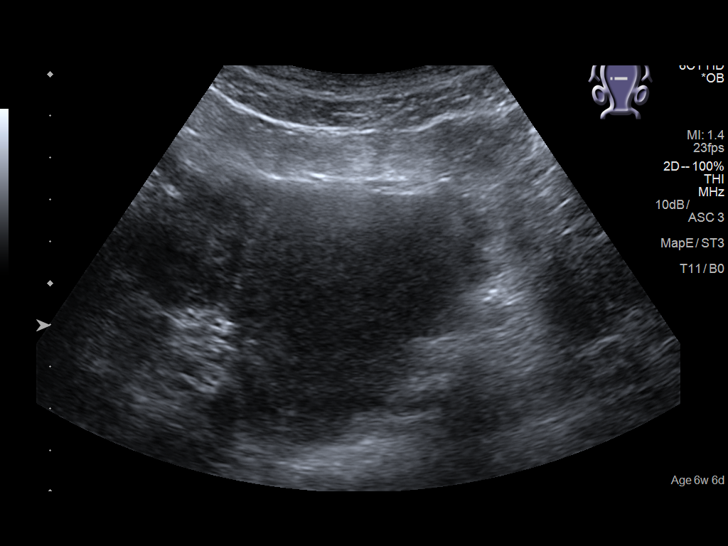
[im 23/121]
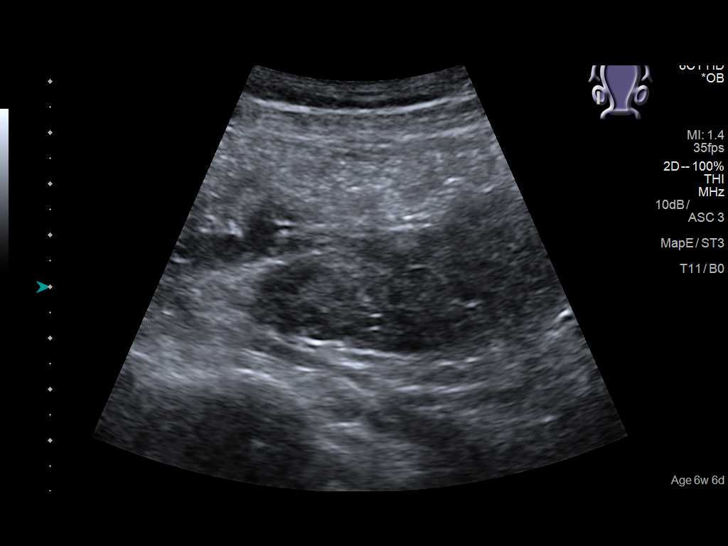
[im 32/121]
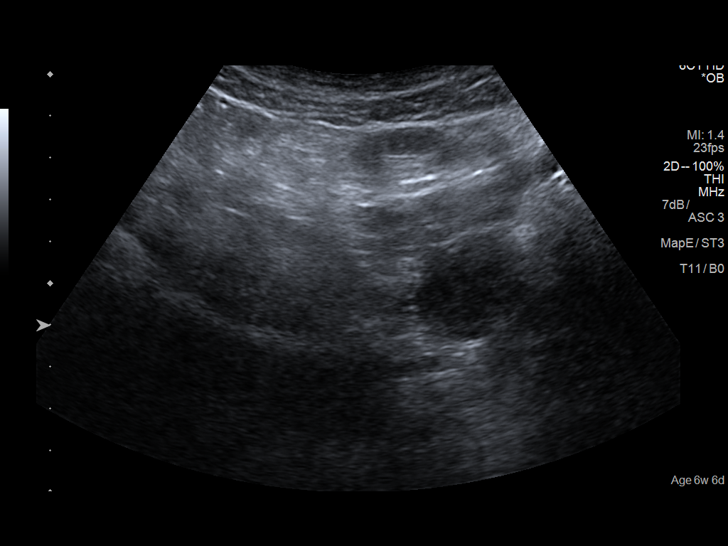
[im 41/121]
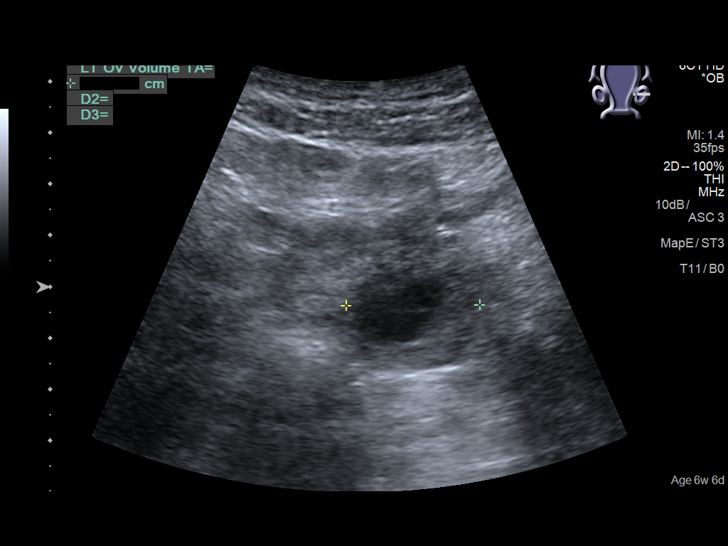
[im 49/121]
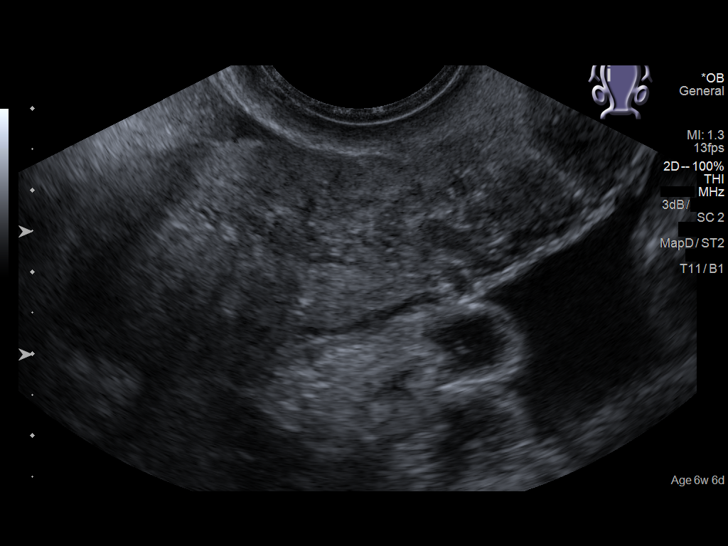
[im 63/121]
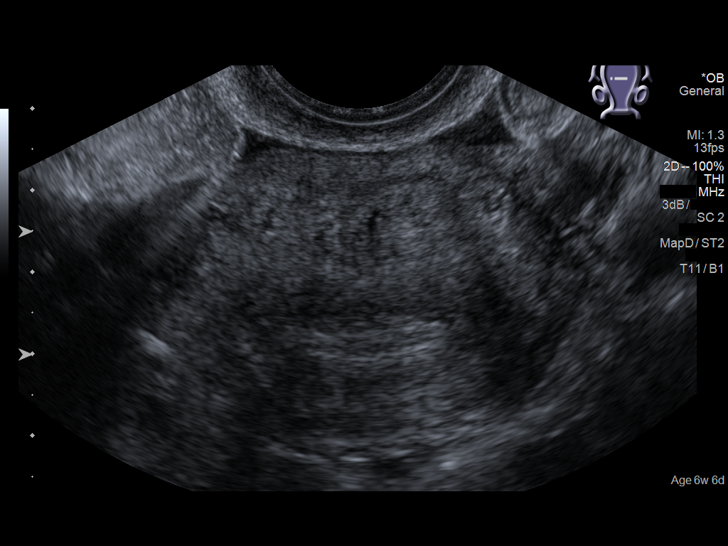
[im 72/121]
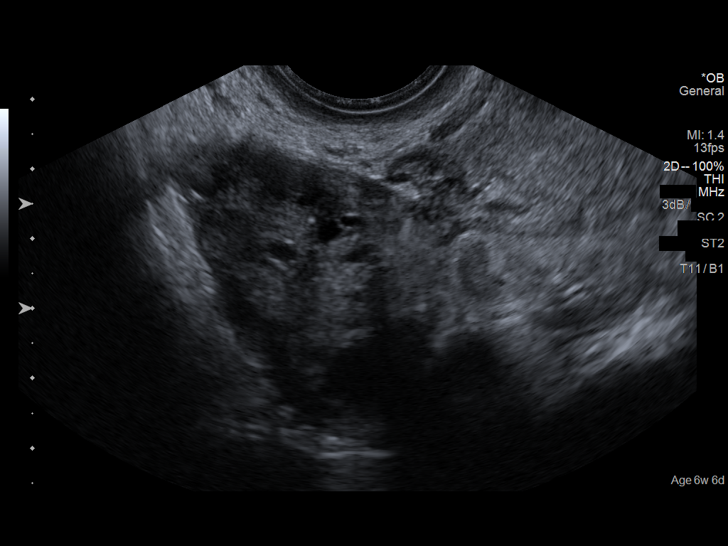
[im 81/121]
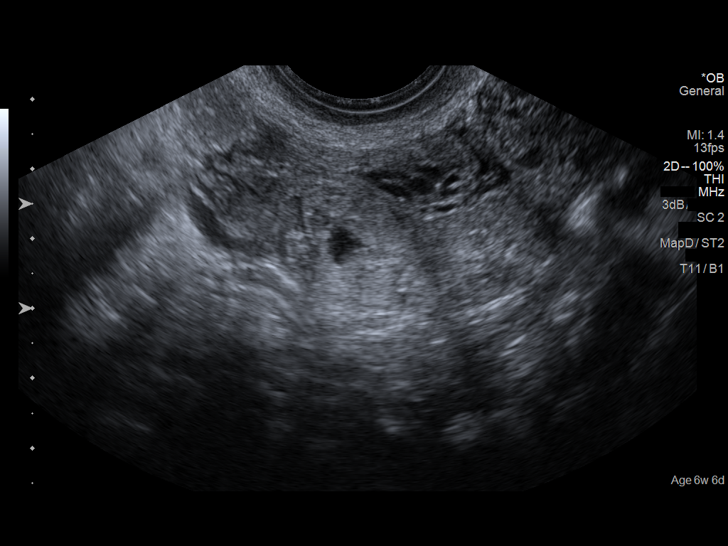
[im 89/121]
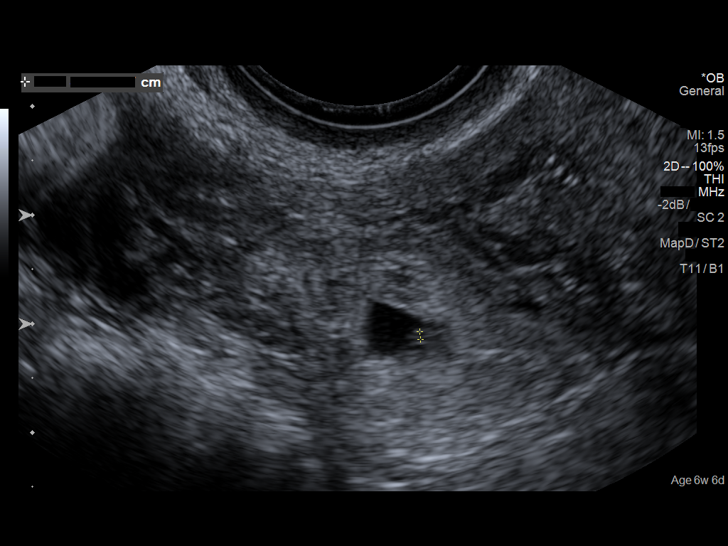
[im 98/121]
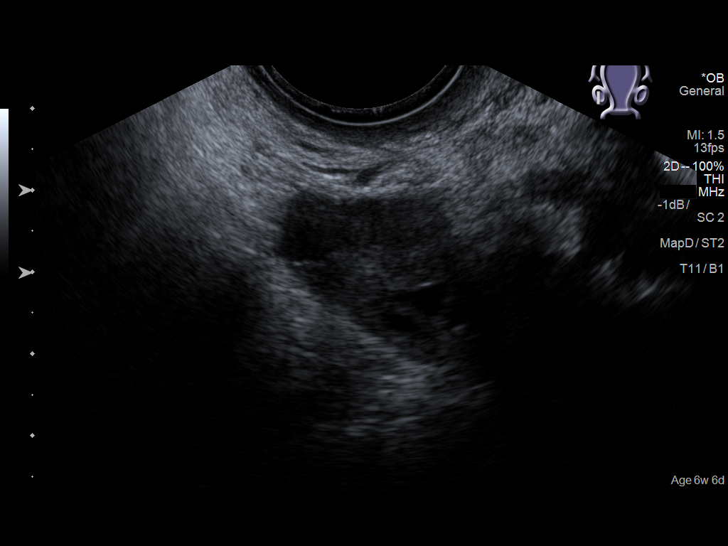
[im 107/121]
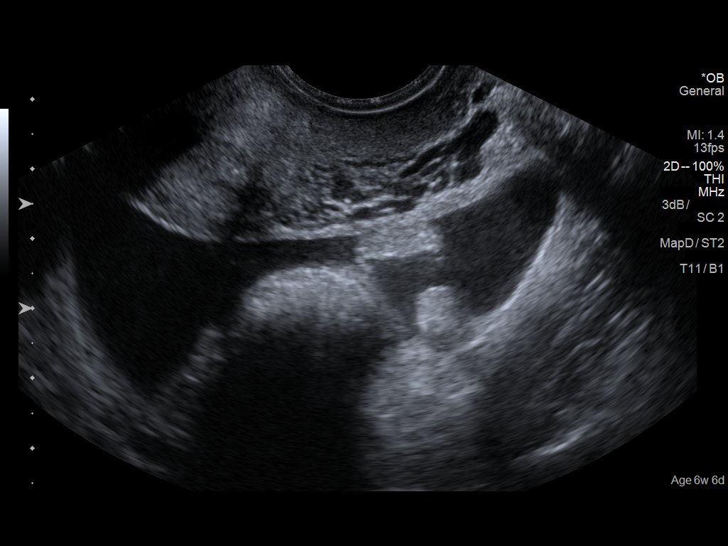
[im 116/121]
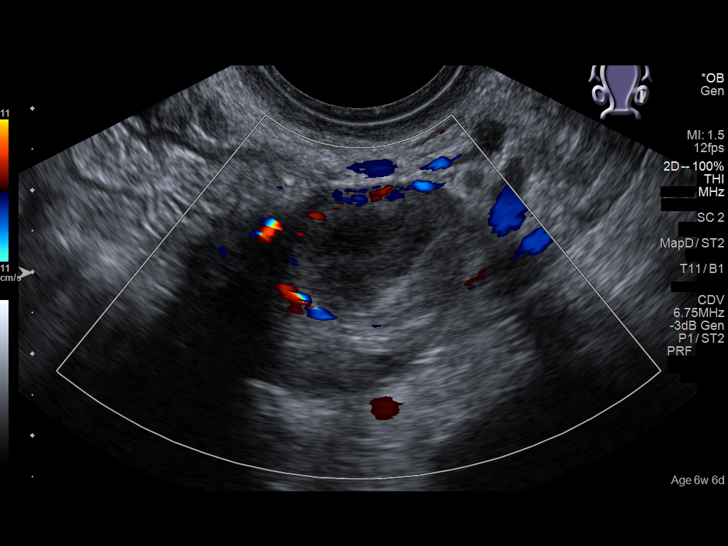

[13 of 28 positions shown; findings below may reference images not displayed]

FINDINGS: Intrauterine gestational sac: No intrauterine gestational sac

Yolk sac: No intrauterine yolk sac

Embryo:  None visualized

Cardiac Activity: None visualized

Subchorionic hemorrhage:  None visualized.

Maternal uterus/adnexae: Right adnexal 2.5 x 2.0 x 2.1 cm complex
mass which appears to contain a gestational sac and yolk sac.
Findings suggest ectopic pregnancy. Gestational sac diameter 6 mm.
2.1 x 1.7 x 1.8 cm left ovarian complex cyst. Findings suggest
corpus luteal cyst. Moderate free fluid in the pelvis.
IMPRESSION: 1. 2.5 x 2.0 x 2.1 cm complex right adnexal mass and what appears to
be a gestational sac and yolk sac within this complex mass. Findings
most consistent with right ectopic pregnancy. Moderate free pelvic
fluid noted.

2. 2.1 x 1.7 x 1.8 cm left ovarian complex cyst again noted. Again
although this could represent an ectopic pregnancy given the
possibility of intrauterine gestational sac and yolk sac this
complex cyst may represent a corpus luteal cyst. Moderate free
pelvic fluid. Close follow-up exams suggested.

Critical Value/emergent results were called by telephone at the time
of interpretation on 08/14/2017 at [DATE] to Dr. Tiger, who
verbally acknowledged these results.

## 2019-04-15 ENCOUNTER — Ambulatory Visit (HOSPITAL_COMMUNITY)
Admission: EM | Admit: 2019-04-15 | Discharge: 2019-04-15 | Discharge status: Against Medical Advice | Payer: Medicaid Other

## 2019-04-18 ENCOUNTER — Encounter (HOSPITAL_COMMUNITY): Payer: Self-pay

## 2019-04-18 ENCOUNTER — Ambulatory Visit (HOSPITAL_COMMUNITY)
Admission: EM | Admit: 2019-04-18 | Discharge: 2019-04-18 | Disposition: A | Discharge status: Home / Self Care | Payer: Medicaid Other | Attending: Emergency Medicine | Admitting: Emergency Medicine

## 2019-04-18 ENCOUNTER — Other Ambulatory Visit: Payer: Self-pay

## 2019-04-18 DIAGNOSIS — F419 Anxiety disorder, unspecified: Secondary | ICD-10-CM | POA: Insufficient documentation

## 2019-04-18 DIAGNOSIS — E039 Hypothyroidism, unspecified: Secondary | ICD-10-CM | POA: Insufficient documentation

## 2019-04-18 DIAGNOSIS — R197 Diarrhea, unspecified: Secondary | ICD-10-CM | POA: Insufficient documentation

## 2019-04-18 DIAGNOSIS — F17210 Nicotine dependence, cigarettes, uncomplicated: Secondary | ICD-10-CM | POA: Insufficient documentation

## 2019-04-18 DIAGNOSIS — F319 Bipolar disorder, unspecified: Secondary | ICD-10-CM | POA: Insufficient documentation

## 2019-04-18 DIAGNOSIS — Z20822 Contact with and (suspected) exposure to covid-19: Secondary | ICD-10-CM

## 2019-04-18 DIAGNOSIS — R109 Unspecified abdominal pain: Secondary | ICD-10-CM | POA: Diagnosis not present

## 2019-04-18 DIAGNOSIS — Z20828 Contact with and (suspected) exposure to other viral communicable diseases: Secondary | ICD-10-CM | POA: Diagnosis not present

## 2019-04-18 DIAGNOSIS — K0889 Other specified disorders of teeth and supporting structures: Secondary | ICD-10-CM | POA: Diagnosis not present

## 2019-04-18 DIAGNOSIS — Z79899 Other long term (current) drug therapy: Secondary | ICD-10-CM | POA: Insufficient documentation

## 2019-04-18 MED ORDER — HYDROCODONE-ACETAMINOPHEN 5-325 MG PO TABS: 2.0000 | ORAL_TABLET | ORAL | 0 refills | Status: DC | PRN

## 2019-04-18 MED ORDER — AMOXICILLIN 500 MG PO CAPS: 500.0000 mg | ORAL_CAPSULE | Freq: Three times a day (TID) | ORAL | 0 refills | Status: DC

## 2019-04-18 NOTE — ED Provider Notes (Signed)
[facility]    CSN: [id number] Arrival date & time: 04/18/19  1012      History   Chief Complaint Chief Complaint  Patient presents with  . Abdominal Pain  . Diarrhea  . Dental Pain    HPI Alexa Guerra is a 28 y.o. female.   Patient presents with request for return to work note.  She states she had one episode of emesis on 04/14/2019 and one episode of diarrhea on 04/15/2019 but none since.  She denies fever, chills, sore throat, cough, shortness of breath.  She states she has a tooth ache on the left lower side; she is having difficulty finding a dentist that will accept Medicaid.  She denies difficulty swallowing or breathing.  LMP: 04/16/2019.  The history is provided by the patient.    Past Medical History:  Diagnosis Date  . Anxiety   . Bipolar disorder (HCC)   . Depression   . Double aortic arch   . Double aortic arch   . Double aortic arch    w/ ring  . Ectopic pregnancy 08/2017  . Hx of varicella   . Hypothyroidism   . Ovarian cyst     Patient Active Problem List   Diagnosis Date Noted  . Dyspareunia, female 11/13/2017  . Preoperative clearance 11/09/2017  . 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 Right ovarian cyst 10/16/2017  . Right lower quadrant abdominal pain 10/16/2017  . Ectopic pregnancy 08/14/2017    Past Surgical History:  Procedure Laterality Date  . WISDOM TOOTH EXTRACTION  2009   all four    OB History    Gravida  3   Para  1   Term  1   Preterm      AB  2   Living  1     SAB  1   TAB      Ectopic  1   Multiple      Live Births  1            Home Medications    Prior to Admission medications   Medication Sig Start Date End Date Taking? Authorizing Provider  FLUoxetine (PROZAC) 40 MG capsule Take by mouth. 10/29/17  Yes [provider]  amoxicillin (AMOXIL) 500 MG capsule Take 1 capsule (500 mg total) by mouth 3 (three) times daily. 04/18/19   Mickie Bail, NP   HYDROcodone-acetaminophen (NORCO/VICODIN) 5-325 MG tablet Take 2 tablets by mouth every 4 (four) hours as needed. 04/18/19   Mickie Bail, NP  ibuprofen (ADVIL,MOTRIN) 600 MG tablet Take 1 tablet (600 mg total) by mouth every 8 (eight) hours as needed. 06/10/18   Merrily Brittle, MD  lamoTRIgine (LAMICTAL) 25 MG tablet TAKE 1 TABLET BY MOUTH EVERY DAY FOR 2 WKS, THEN 2 TABS DAILY FOR 2 WEEKS, THEN FOLLOWUP FOR RECHECK 10/26/17   [provider]  methimazole (TAPAZOLE) 5 MG tablet Take 5 mg by mouth daily. 10/29/17   [provider]  Multiple Vitamin (MULTIVITAMIN) tablet Take 1 tablet by mouth daily.    [provider]  norethindrone (MICRONOR,CAMILA,ERRIN) 0.35 MG tablet Take 1 tablet (0.35 mg total) by mouth daily. 06/14/18   Conard Novak, MD  OLANZapine (ZYPREXA) 10 MG tablet TAKE 1 TABLET BY MOUTH EVERY DAY **DISCONTINUE OLANZAPINE-FLUOXETINE** 10/29/17   [provider]  ondansetron (ZOFRAN ODT) 4 MG disintegrating tablet Take 1 tablet (4 mg total) by mouth every 8 (eight) hours as needed for nausea or vomiting. 06/10/18  Darel Hong, MD  oxyCODONE-acetaminophen (PERCOCET/ROXICET) 5-325 MG tablet Take 1 tablet by mouth every 4 (four) hours as needed for severe pain. Patient not taking: Reported on 06/14/2018 06/10/18   Darel Hong, MD    Family History Family History  Problem Relation Age of Onset  . Drug abuse Mother   . Cancer Mother        cervix  . Alcohol abuse Father   . Hypertension Maternal Grandmother   . Diabetes Maternal Grandmother   . Hypertension Maternal Grandfather   . Diabetes Maternal Grandfather   . Hypertension Paternal Grandmother   . Hypertension Paternal Grandfather     Social History Social History   Tobacco Use  . Smoking status: Current Every Day Smoker    Packs/day: 0.25    Types: Cigarettes  . Smokeless tobacco: Never Used  Substance Use Topics  . Alcohol use: Yes    Comment: Occasionally  . Drug use:  No     Allergies   Other and Beta adrenergic blockers   Review of Systems Review of Systems  Constitutional: Negative for chills and fever.  HENT: Positive for dental problem. Negative for ear pain and sore throat.   Eyes: Negative for pain and visual disturbance.  Respiratory: Negative for cough and shortness of breath.   Cardiovascular: Negative for chest pain and palpitations.  Gastrointestinal: Positive for diarrhea and vomiting. Negative for abdominal pain.  Genitourinary: Negative for dysuria and hematuria.  Musculoskeletal: Negative for arthralgias and back pain.  Skin: Negative for color change and rash.  Neurological: Negative for seizures and syncope.  All other systems reviewed and are negative.    Physical Exam Triage Vital Signs ED Triage Vitals  Enc Vitals Group     BP 04/18/19 1116 (!) 104/57     Pulse Rate 04/18/19 1116 82     Resp 04/18/19 1116 16     Temp 04/18/19 1116 98.9 F (37.2 C)     Temp Source 04/18/19 1116 Temporal     SpO2 04/18/19 1116 97 %     Weight --      Height --      Head Circumference --      Peak Flow --      Pain Score 04/18/19 1111 5     Pain Loc --      Pain Edu? --      Excl. in [place]? --    No data found.  Updated Vital Signs BP (!) 104/57 (BP Location: Left Arm)   Pulse 82   Temp 98.9 F (37.2 C) (Temporal)   Resp 16   SpO2 97%   Visual Acuity Right Eye Distance:   Left Eye Distance:   Bilateral Distance:    Right Eye Near:   Left Eye Near:    Bilateral Near:     Physical Exam Vitals signs and nursing note reviewed.  Constitutional:      General: She is not in acute distress.    Appearance: She is well-developed.  HENT:     Head: Normocephalic and atraumatic.     Right Ear: Tympanic membrane normal.     Left Ear: Tympanic membrane normal.     Mouth/Throat:     Mouth: Mucous membranes are moist.     Dentition: Dental caries present.     Pharynx: Oropharynx is clear. Uvula midline.      Comments:  Broken tooth. Eyes:     Conjunctiva/sclera: Conjunctivae normal.  Neck:     Musculoskeletal: Neck supple.  Cardiovascular:     Rate and Rhythm: Normal rate and regular rhythm.     Heart sounds: No murmur.  Pulmonary:     Effort: Pulmonary effort is normal. No respiratory distress.     Breath sounds: Normal breath sounds.  Abdominal:     General: Bowel sounds are normal.     Palpations: Abdomen is soft.     Tenderness: There is no abdominal tenderness. There is no right CVA tenderness, left CVA tenderness, guarding or rebound.  Skin:    General: Skin is warm and dry.     Findings: No rash.  Neurological:     Mental Status: She is alert.      UC Treatments / Results  Labs (all labs ordered are listed, but only abnormal results are displayed) Labs Reviewed - No data to display  EKG   Radiology No results found.  Procedures Procedures (including critical care time)  Medications Ordered in UC Medications - No data to display  Initial Impression / Assessment and Plan / UC Course  I have reviewed the triage vital signs and the nursing notes.  Pertinent labs & imaging results that were available during my care of the patient were reviewed by me and considered in my medical decision making (see chart for details).    Tooth ache.  Diarrhea, suspected COVID.  Treating tooth ache with amoxicillin and 6 tablets of Norco.  Precautions for Norco given to patient including not to drive, operate machinery, or drink alcohol with this medication.  COVID test performed here.  Instructed patient to self quarantine until her test results are back.  Instructed patient to go to the emergency department if she develops high fever, shortness of breath, severe diarrhea, or other concerning symptoms.  Patient agrees with plan of care.    Final Clinical Impressions(s) / UC Diagnoses   Final diagnoses:  Toothache  Diarrhea, unspecified type  Suspected Covid-19 Virus Infection      Discharge Instructions     Take the amoxicillin as prescribed for your tooth ache.  You can take the prescribed Norco for the pain; do not drive, operate machinery, or drink alcohol with this medication as it may make you drowsy.    Your COVID test is pending.  You should self quarantine until your test result is back and is negative.    Go to the emergency department if you develop shortness of breath, high fever, severe diarrhea, or other concerning symptoms.       ED Prescriptions    Medication Sig Dispense Auth. Provider   amoxicillin (AMOXIL) 500 MG capsule Take 1 capsule (500 mg total) by mouth 3 (three) times daily. 21 capsule Wendee Beaversate, Anakin Varkey H, NP   HYDROcodone-acetaminophen (NORCO/VICODIN) 5-325 MG tablet Take 2 tablets by mouth every 4 (four) hours as needed. 6 tablet Mickie Bailate, Tzivia Oneil H, NP     Controlled Substance Prescriptions [place] Controlled Substance Registry consulted? Yes, I have consulted the [place] Controlled Substances Registry for this patient, and feel the risk/benefit ratio today is favorable for proceeding with this prescription for a controlled substance.   Mickie Bailate, Kaelah Hayashi H, NP 04/18/19 1146

## 2019-04-18 NOTE — Discharge Instructions (Addendum)
Take the amoxicillin as prescribed for your tooth ache.  You can take the prescribed Norco for the pain; do not drive, operate machinery, or drink alcohol with this medication as it may make you drowsy.    Your COVID test is pending.  You should self quarantine until your test result is back and is negative.    Go to the emergency department if you develop shortness of breath, high fever, severe diarrhea, or other concerning symptoms.

## 2019-04-18 NOTE — ED Triage Notes (Signed)
Pt report she's been having abdominal pain, diarrhea and  dental pain since 04/14/2019.

## 2019-04-20 LAB — NOVEL CORONAVIRUS, NAA (HOSP ORDER, SEND-OUT TO REF LAB; TAT 18-24 HRS): SARS-CoV-2, NAA: NOT DETECTED

## 2019-04-21 ENCOUNTER — Encounter (HOSPITAL_COMMUNITY): Payer: Self-pay

## 2019-06-20 ENCOUNTER — Other Ambulatory Visit: Payer: Self-pay | Admitting: Obstetrics and Gynecology

## 2019-06-20 DIAGNOSIS — Z30011 Encounter for initial prescription of contraceptive pills: Secondary | ICD-10-CM

## 2020-03-23 IMAGING — US US ART/VEN ABD/PELV/SCROTUM DOPPLER LTD
1 series · 13 of 25 positions shown · non-contrast
Comparison: Ob ultrasound 08/14/2017

CLINICAL DATA: Right lower quadrant pain for months. History of
ovarian cyst. No menstrual cycle since [REDACTED].



[Series 1: us art/ven abd/pelv/scrotum doppler ltd · 103 acquisitions, 13 frames shown]
[im 1/103]
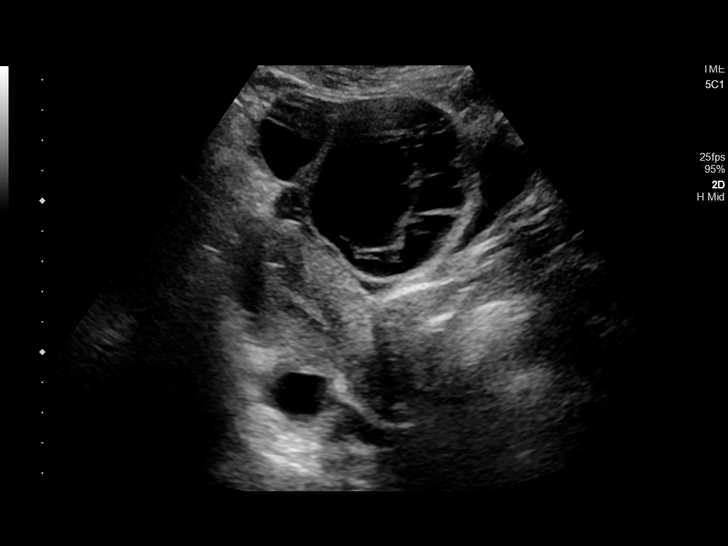
[im 9/103]
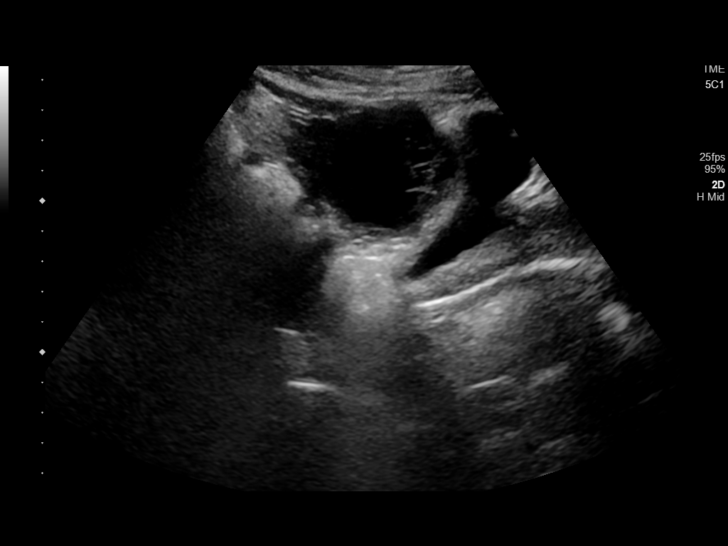
[im 18/103]
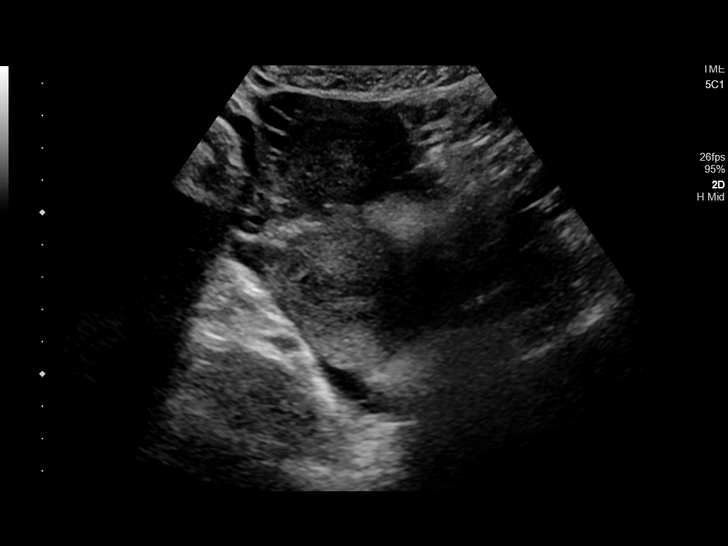
[im 26/103]
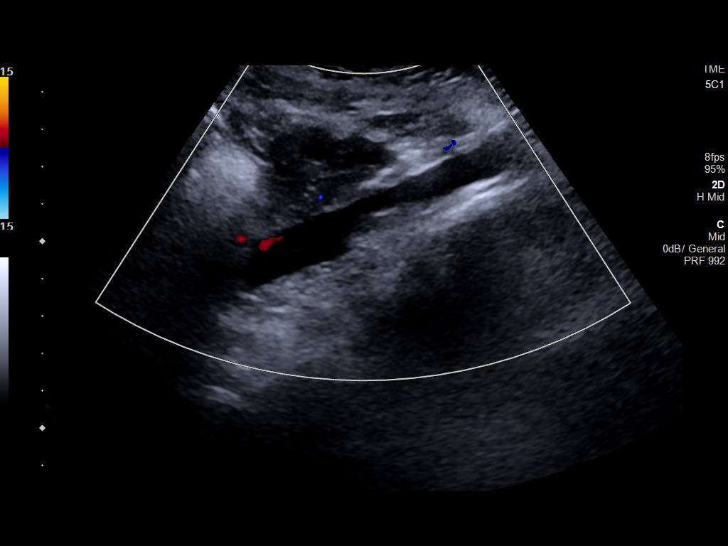
[im 35/103]
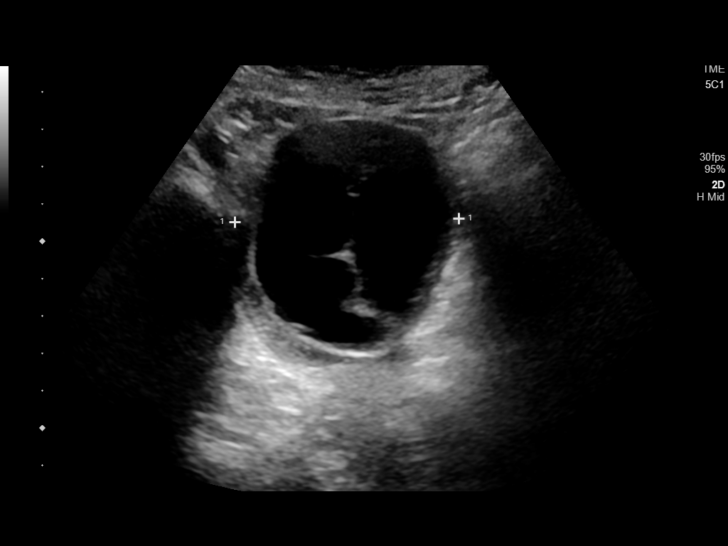
[im 43/103]
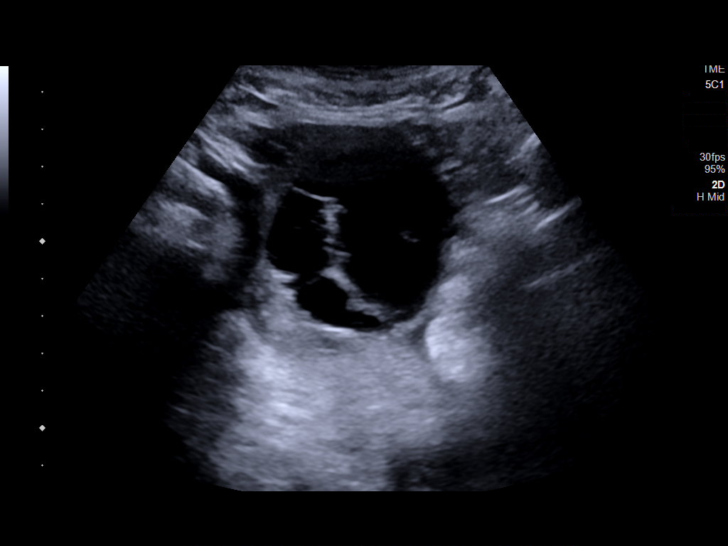
[im 52/103]
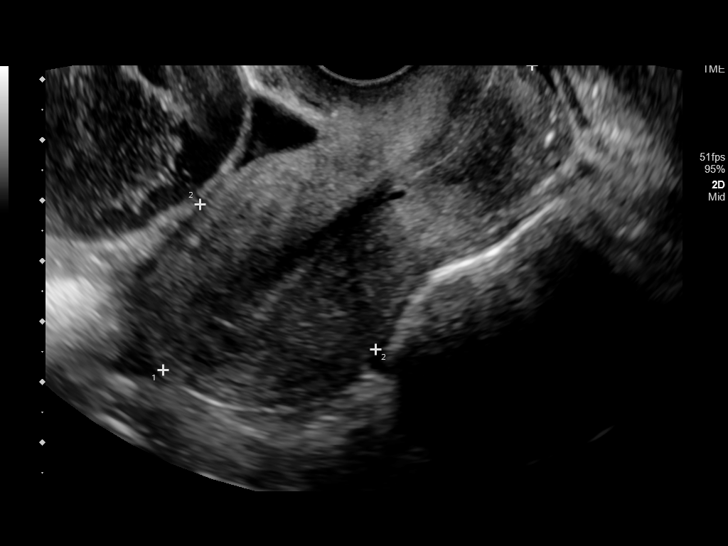
[im 60/103]
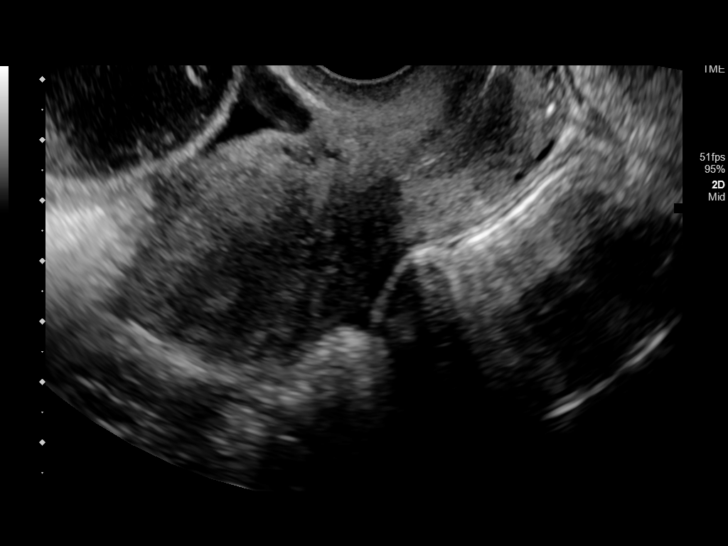
[im 69/103]
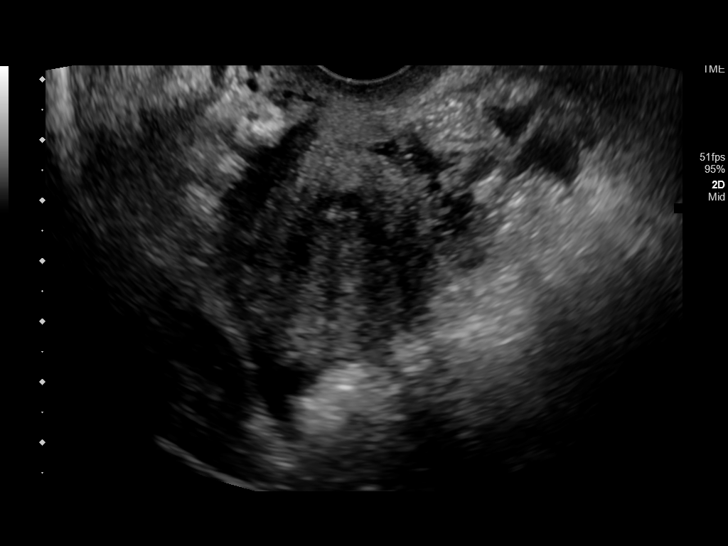
[im 77/103]
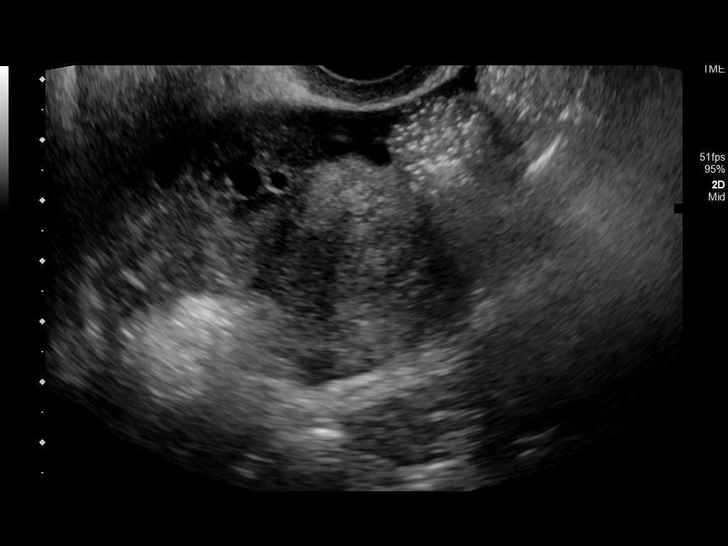
[im 86/103]
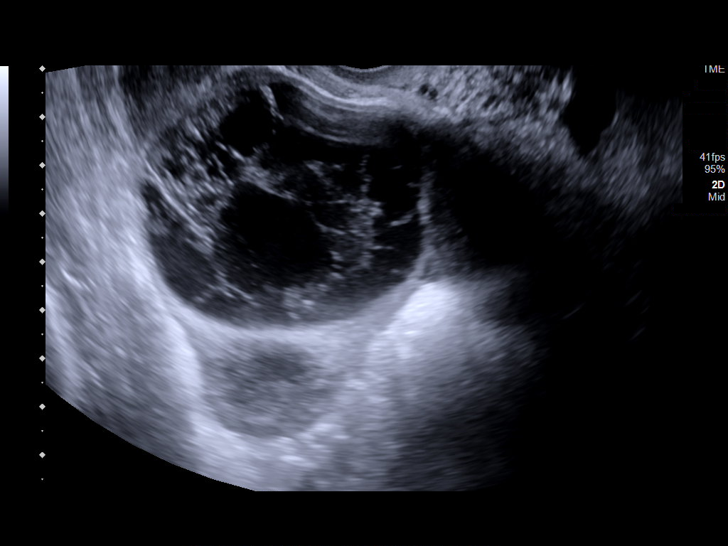
[im 94/103]
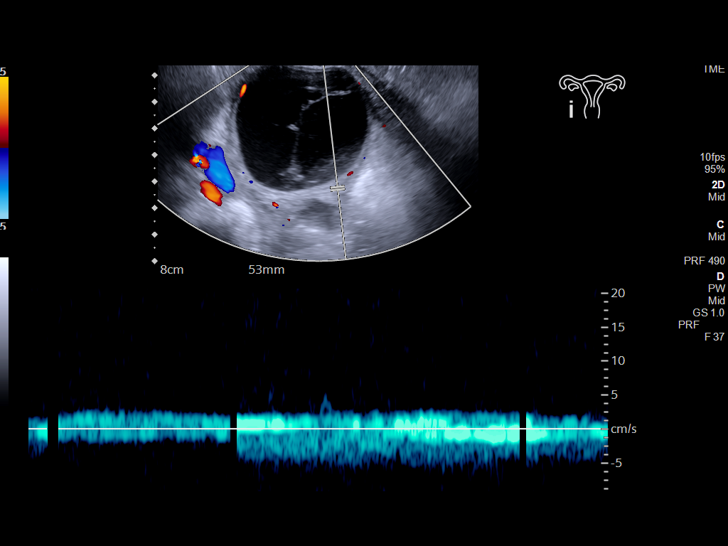
[im 103/103]
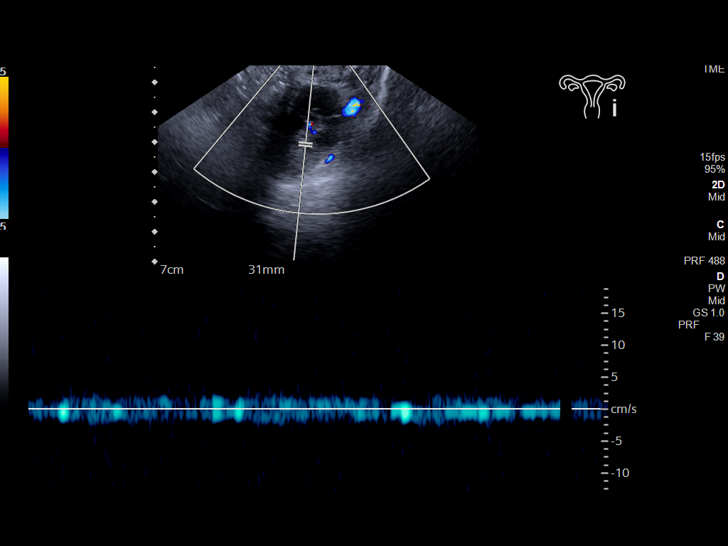

[13 of 25 positions shown; findings below may reference images not displayed]

FINDINGS: Uterus

Measurements: 7.9 x 3.8 x 4.5 cm = volume: 70.54 mL. No fibroids or
other mass visualized.

Endometrium

Thickness: 6 mm.  No focal abnormality visualized.

Right ovary

Measurements: 7 x 6.5 x 5.6 cm = volume: 132 mL mL. Complex cystic
mass with septation and internal debris measuring 6.1 x 4.9 x
cm. No flow demonstrated in the mass on color flow Doppler imaging.

Left ovary

Measurements: 3.1 x 2.5 x 2.4 cm = volume: 9.8 mL. Normal
appearance/no adnexal mass.

Pulsed Doppler evaluation of both ovaries demonstrates normal
low-resistance arterial and venous waveforms.

Other findings

Moderate free fluid in the pelvis.
IMPRESSION: 1. Uterus and endometrium are unremarkable.
2. Complex cystic mass with septation and internal debris in the
right ovary measuring up to 6.1 cm diameter. Probably a hemorrhagic
cyst but due to size, follow-up in 6-12 weeks is recommended to
ensure resolution.
3. Moderate free pelvic fluid.

## 2021-01-04 ENCOUNTER — Emergency Department: Payer: Medicaid Other

## 2021-01-04 ENCOUNTER — Emergency Department
Admission: EM | Admit: 2021-01-04 | Discharge: 2021-01-04 | Disposition: A | Discharge status: Home / Self Care | Payer: Medicaid Other | Attending: Emergency Medicine | Admitting: Emergency Medicine

## 2021-01-04 ENCOUNTER — Other Ambulatory Visit: Payer: Self-pay

## 2021-01-04 DIAGNOSIS — E039 Hypothyroidism, unspecified: Secondary | ICD-10-CM | POA: Insufficient documentation

## 2021-01-04 DIAGNOSIS — F17210 Nicotine dependence, cigarettes, uncomplicated: Secondary | ICD-10-CM | POA: Diagnosis not present

## 2021-01-04 DIAGNOSIS — R609 Edema, unspecified: Secondary | ICD-10-CM | POA: Diagnosis not present

## 2021-01-04 MED ORDER — FUROSEMIDE 20 MG PO TABS: 20.0000 mg | ORAL_TABLET | Freq: Every day | ORAL | 0 refills | Status: DC

## 2021-01-04 NOTE — Discharge Instructions (Signed)
The ultrasound was negative for DVTs.  Read and follow discharge care instructions.  Take medication as directed.

## 2021-01-04 NOTE — ED Notes (Signed)
Positive pulses to right foot    Very faint pulses noted to left  Provider aware

## 2021-01-04 NOTE — ED Triage Notes (Signed)
Pt comes with c/o bilateral leg swelling for about two weeks. Pt states she is currently living in her car and not sure if that has something to do with it. Pt states no long distance trips. Pt states she tries to get up and walk.

## 2021-01-04 NOTE — ED Provider Notes (Signed)
[facility] Emergency Department Provider Note   ____________________________________________   Event Date/Time   First MD Initiated Contact with Patient 01/04/21 1140     (approximate)  I have reviewed the triage vital signs and the nursing notes.   HISTORY  Chief Complaint Leg Pain    HPI Alexa Guerra is a 30 y.o. female patient presents with bilateral lower leg swelling for 2 weeks.  Patient she is currently living occurred on a lot of stress.  Patient states she is living in a car at this time.  Patient denies recent distant travel.  Patient denies chest pain or dyspnea.  Patient rates pain as a 10/10.  Described pain as "achy".  Patient states pain decreases when she is able to get off her feet and elevate her legs.  Patient said it is hard to do secondary to living conditions at this time.         Past Medical History:  Diagnosis Date  . Anxiety   . Bipolar disorder (HCC)   . Depression   . Double aortic arch   . Double aortic arch   . Double aortic arch    w/ ring  . Ectopic pregnancy 08/2017  . Hx of varicella   . Hypothyroidism   . Ovarian cyst     Patient Active Problem List   Diagnosis Date Noted  . Dyspareunia, female 11/13/2017  . Preoperative clearance 11/09/2017  . 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 Right ovarian cyst 10/16/2017  . Right lower quadrant abdominal pain 10/16/2017  . Ectopic pregnancy 08/14/2017    Past Surgical History:  Procedure Laterality Date  . WISDOM TOOTH EXTRACTION  2009   all four    Prior to Admission medications   Medication Sig Start Date End Date Taking? Authorizing Provider  furosemide (LASIX) 20 MG tablet Take 1 tablet (20 mg total) by mouth daily. 01/04/21 01/04/22 Yes Joni Reining, PA-C  FLUoxetine (PROZAC) 40 MG capsule Take by mouth. 10/29/17   [provider]  lamoTRIgine (LAMICTAL) 25 MG tablet TAKE 1 TABLET BY MOUTH EVERY DAY FOR 2 WKS, THEN 2 TABS DAILY  FOR 2 WEEKS, THEN FOLLOWUP FOR RECHECK 10/26/17   [provider]  methimazole (TAPAZOLE) 5 MG tablet Take 5 mg by mouth daily. 10/29/17   [provider]  Multiple Vitamin (MULTIVITAMIN) tablet Take 1 tablet by mouth daily.    [provider]  norethindrone (MICRONOR,CAMILA,ERRIN) 0.35 MG tablet Take 1 tablet (0.35 mg total) by mouth daily. 06/14/18   Conard Novak, MD  OLANZapine (ZYPREXA) 10 MG tablet TAKE 1 TABLET BY MOUTH EVERY DAY **DISCONTINUE OLANZAPINE-FLUOXETINE** 10/29/17   [provider]  ondansetron (ZOFRAN ODT) 4 MG disintegrating tablet Take 1 tablet (4 mg total) by mouth every 8 (eight) hours as needed for nausea or vomiting. 06/10/18   Merrily Brittle, MD    Allergies Other and Beta adrenergic blockers  Family History  Problem Relation Age of Onset  . Drug abuse Mother   . Cancer Mother        cervix  . Alcohol abuse Father   . Hypertension Maternal Grandmother   . Diabetes Maternal Grandmother   . Hypertension Maternal Grandfather   . Diabetes Maternal Grandfather   . Hypertension Paternal Grandmother   . Hypertension Paternal Grandfather     Social History Social History   Tobacco Use  . Smoking status: Current Every Day Smoker    Packs/day: 0.25    Types: Cigarettes  .  Smokeless tobacco: Never Used  Vaping Use  . Vaping Use: Never used  Substance Use Topics  . Alcohol use: Not Currently    Comment: Occasionally  . Drug use: No    Review of Systems Constitutional: No fever/chills Eyes: No visual changes. ENT: No sore throat. Cardiovascular: Denies chest pain. Respiratory: Denies shortness of breath. Gastrointestinal: No abdominal pain.  No nausea, no vomiting.  No diarrhea.  No constipation. Genitourinary: Negative for dysuria. Musculoskeletal: Negative for back pain. Skin: Negative for rash. Neurological: Negative for headaches, focal weakness or numbness. Psychiatric:  Anxiety, bipolar,  depression. Endocrine:  Hypothyroidism. Allergic/Immunilogical: Beta-blocker ____________________________________________   PHYSICAL EXAM:  VITAL SIGNS: ED Triage Vitals  Enc Vitals Group     BP 01/04/21 1135 122/71     Pulse Rate 01/04/21 1135 78     Resp 01/04/21 1135 19     Temp 01/04/21 1135 98 F (36.7 C)     Temp src --      SpO2 01/04/21 1135 97 %     Weight 01/04/21 1138 149 lb 14.6 oz (68 kg)     Height 01/04/21 1138 5\' 4"  (1.626 m)     Head Circumference --      Peak Flow --      Pain Score 01/04/21 1134 10     Pain Loc --      Pain Edu? --      Excl. in [place]? --    Constitutional: Alert and oriented. Well appearing and in no acute distress. Eyes: Conjunctivae are normal. PERRL. EOMI. Cardiovascular: Normal rate, regular rhythm. Grossly normal heart sounds.  Patient had good peripheral circulation lower extremity on the right.  Difficulty eliciting pulse on the right abdominal pelvis with palpation and Doppler.  Will reassess with ultrasound. Respiratory: Normal respiratory effort.  No retractions. Lungs CTAB. Gastrointestinal: Soft and nontender. No distention. No abdominal bruits. No CVA tenderness. Musculoskeletal: No lower extremity tenderness nor edema.  No joint effusions. Neurologic:  Normal speech and language. No gross focal neurologic deficits are appreciated. No gait instability. Skin:  Skin is warm, dry and intact. No rash noted. Psychiatric: Mood and affect are normal. Speech and behavior are normal.  ____________________________________________   LABS (all labs ordered are listed, but only abnormal results are displayed)  Labs Reviewed - No data to display ____________________________________________  EKG   ____________________________________________  RADIOLOGY I, 01/06/21, personally viewed and evaluated these images (plain radiographs) as part of my medical decision making, as well as reviewing the written report by the  radiologist.  ED MD interpretation:  Official radiology report(s): Joni Reining Venous Img Lower Unilateral Left  Result Date: 01/04/2021 CLINICAL DATA:  30 year old female with left lower extremity edema EXAM: LEFT LOWER EXTREMITY VENOUS DOPPLER ULTRASOUND TECHNIQUE: Gray-scale sonography with compression, as well as color and duplex ultrasound, were performed to evaluate the deep venous system(s) from the level of the common femoral vein through the popliteal and proximal calf veins. COMPARISON:  None. FINDINGS: VENOUS Normal compressibility of the common femoral, superficial femoral, and popliteal veins, as well as the visualized calf veins. Visualized portions of profunda femoral vein and great saphenous vein unremarkable. No filling defects to suggest DVT on grayscale or color Doppler imaging. Doppler waveforms show normal direction of venous flow, normal respiratory plasticity and response to augmentation. Limited views of the contralateral common femoral vein are unremarkable. OTHER None. Limitations: none IMPRESSION: Negative. Electronically Signed   By: 32 M.D.   On: 01/04/2021 13:29  ____________________________________________   PROCEDURES  Procedure(s) performed (including Critical Care):  Procedures   ____________________________________________   INITIAL IMPRESSION / ASSESSMENT AND PLAN / ED COURSE  As part of my medical decision making, I reviewed the following data within the electronic MEDICAL RECORD NUMBER         Patient presents with bilateral leg pain and edema.  Patient states complaint resolves when she is in supine position can elevate her legs.  Also the complaint for about 2 weeks since she started living out of her car.  Denies dyspnea or chest pain.  Discussed no acute findings on ultrasound of the left lower extremity.  Patient given discharge care instructions and advised take medication as directed.       ____________________________________________   FINAL CLINICAL IMPRESSION(S) / ED DIAGNOSES  Final diagnoses:  Peripheral edema     ED Discharge Orders         Ordered    furosemide (LASIX) 20 MG tablet  Daily        01/04/21 1338           Note:  This document was prepared using Dragon voice recognition software and may include unintentional dictation errors.    Joni Reining, PA-C 01/04/21 1345    Sharman Cheek, MD 01/04/21 [phone]

## 2021-01-04 NOTE — ED Notes (Signed)
See triage note  Presents with BLE edema  States they have been living in the car for about 2 weeks   States she has not had any injury or SOB   States this has happened to her in the past  But was relieved with elevation

## 2021-08-09 ENCOUNTER — Ambulatory Visit: Payer: Medicaid Other | Attending: Nurse Practitioner | Admitting: Audiologist

## 2021-08-25 ENCOUNTER — Ambulatory Visit: Payer: Medicaid Other | Admitting: Audiologist

## 2021-09-07 ENCOUNTER — Ambulatory Visit: Payer: Medicaid Other | Attending: Nurse Practitioner | Admitting: Audiologist

## 2022-10-18 IMAGING — US US EXTREM LOW VENOUS*L*
1 series · 14 of 24 positions shown · non-contrast
Comparison: None.

CLINICAL DATA: 30-year-old female with left lower extremity edema

EXAM:
LEFT LOWER EXTREMITY VENOUS DOPPLER ULTRASOUND
TECHNIQUE: Gray-scale sonography with compression, as well as color and duplex
ultrasound, were performed to evaluate the deep venous system(s)
from the level of the common femoral vein through the popliteal and
proximal calf veins.

[Series 1: lev · 14 of 33 slices shown]
[im 1/33]
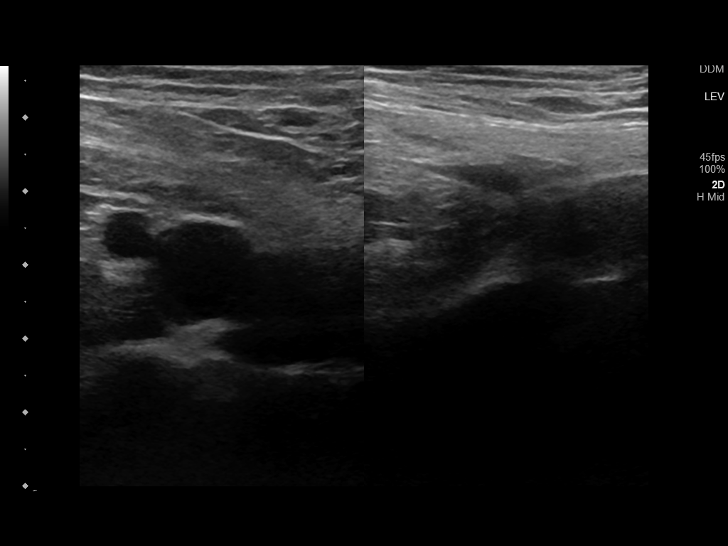
[im 3/33]
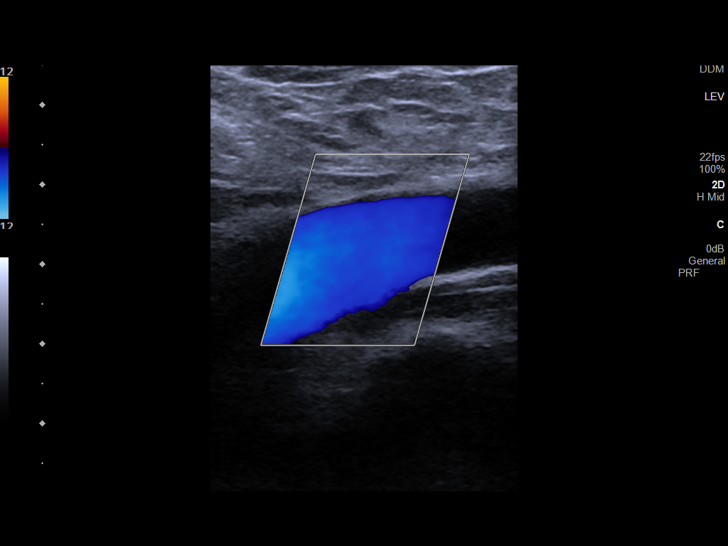
[im 6/33]
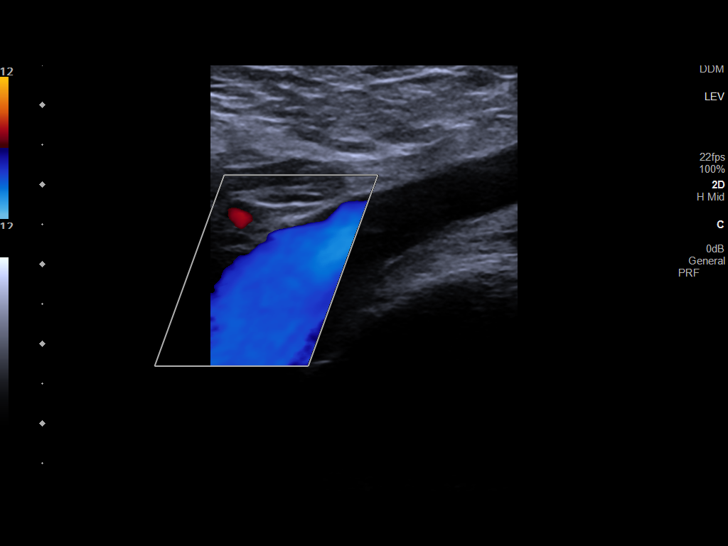
[im 9/33]
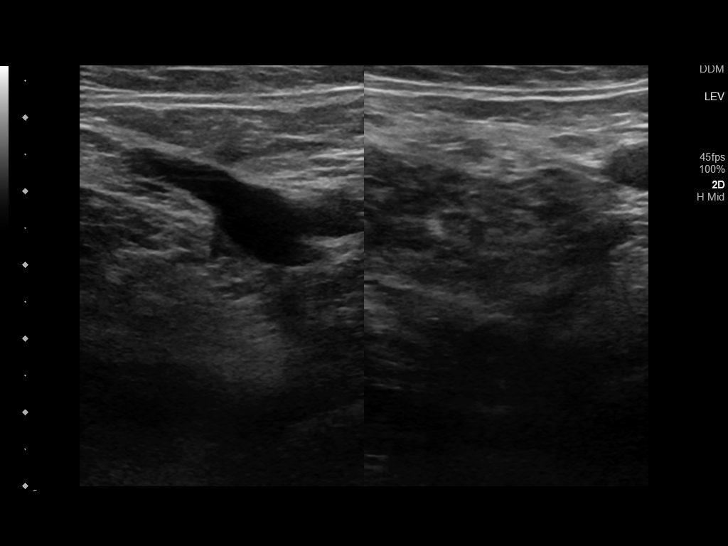
[im 10/33]
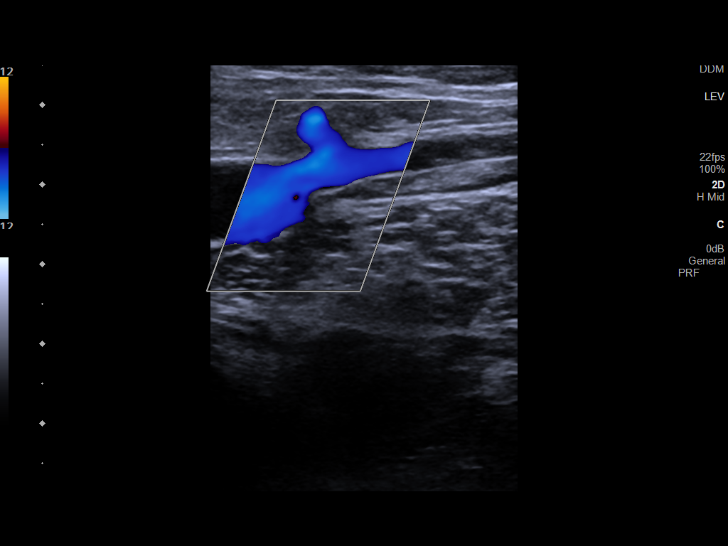
[im 13/33]
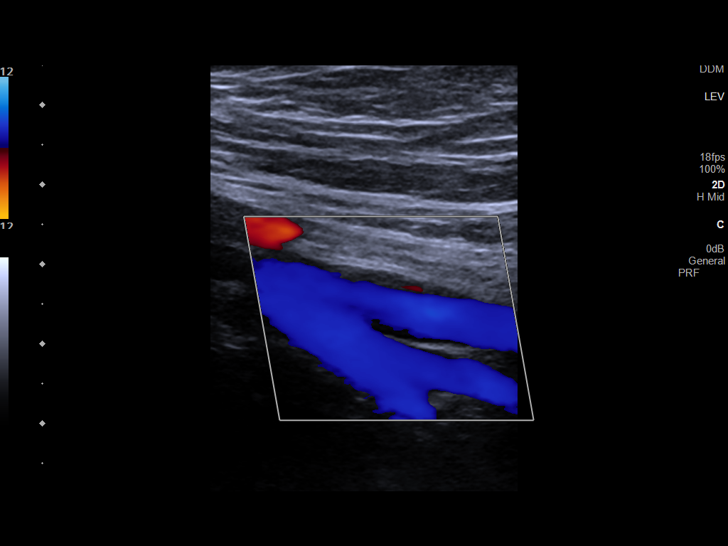
[im 16/33]
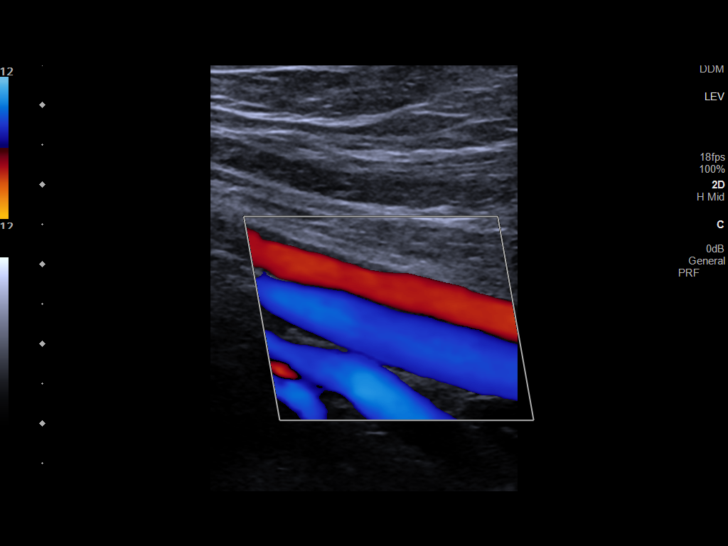
[im 17/33]
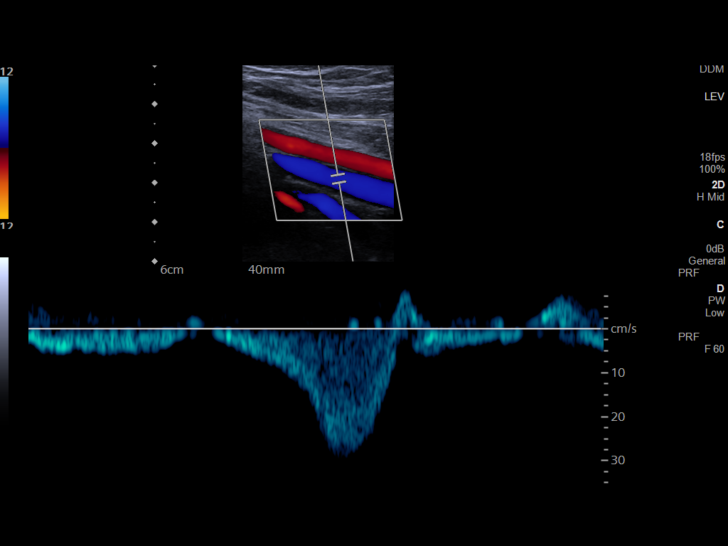
[im 20/33]
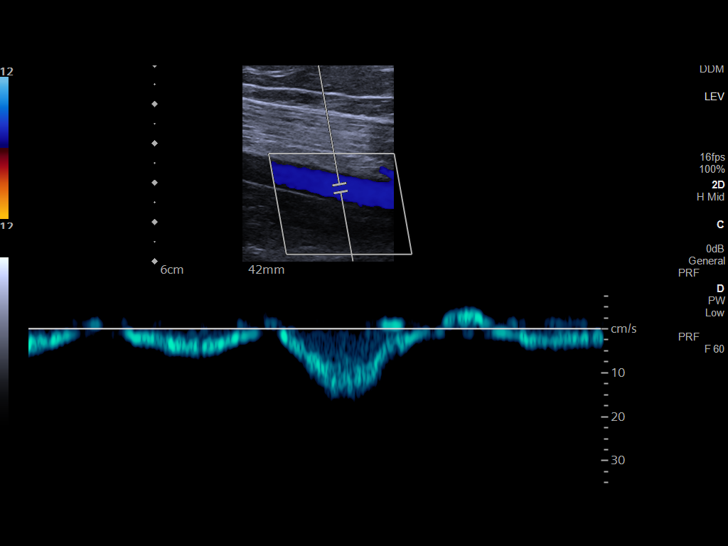
[im 23/33]
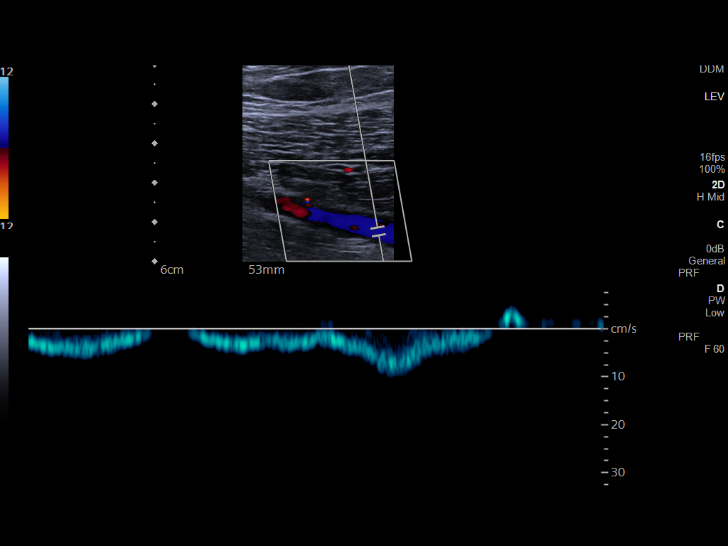
[im 26/33]
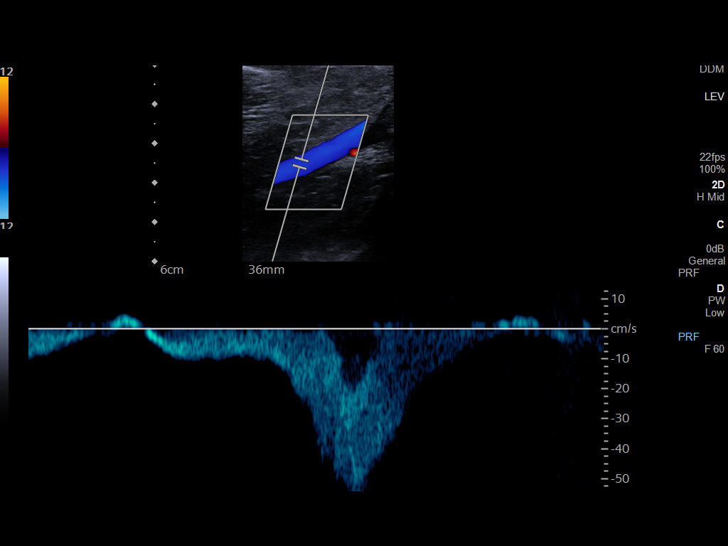
[im 27/33]
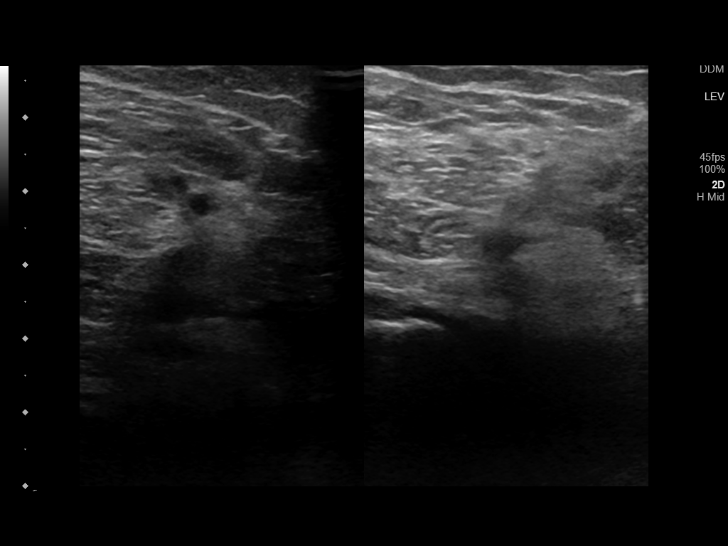
[im 30/33]
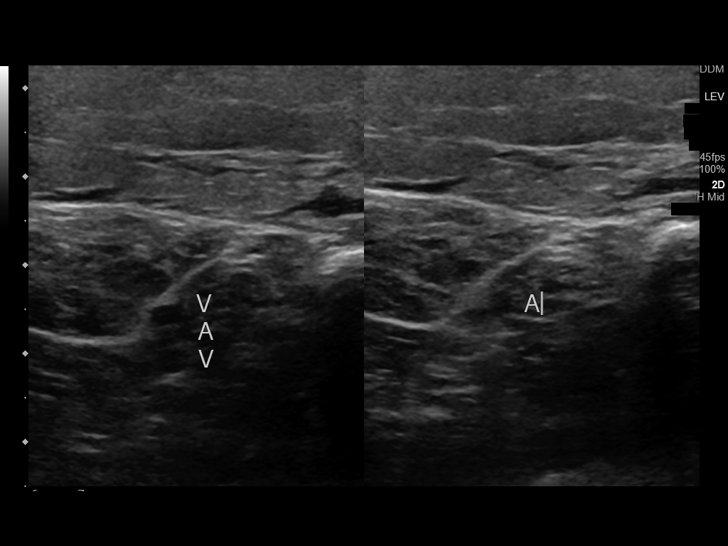
[im 33/33]
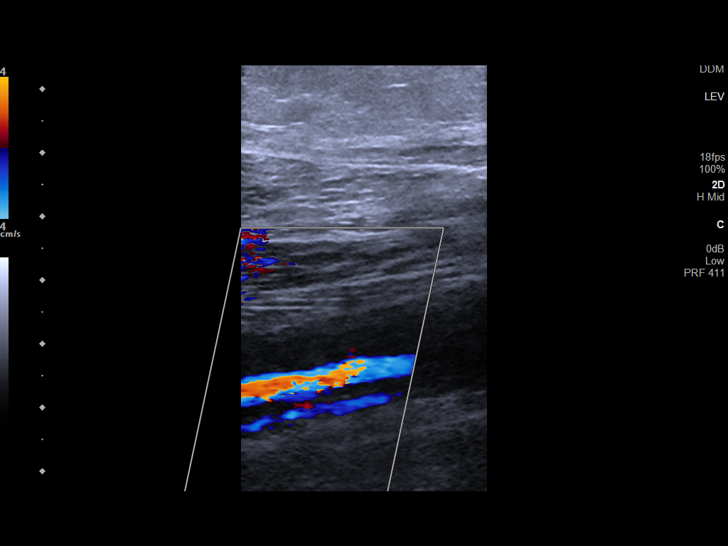

[14 of 24 positions shown; findings below may reference images not displayed]

FINDINGS: VENOUS

Normal compressibility of the common femoral, superficial femoral,
and popliteal veins, as well as the visualized calf veins.
Visualized portions of profunda femoral vein and great saphenous
vein unremarkable. No filling defects to suggest DVT on grayscale or
color Doppler imaging. Doppler waveforms show normal direction of
venous flow, normal respiratory plasticity and response to
augmentation.

Limited views of the contralateral common femoral vein are
unremarkable.

OTHER

None.

Limitations: none
IMPRESSION: Negative.

## 2023-10-25 ENCOUNTER — Emergency Department
Admission: EM | Admit: 2023-10-25 | Discharge: 2023-10-26 | Disposition: A | Discharge status: Other Acute Inpatient Hospital | Attending: Emergency Medicine | Admitting: Emergency Medicine

## 2023-10-25 ENCOUNTER — Other Ambulatory Visit: Payer: Self-pay

## 2023-10-25 ENCOUNTER — Encounter: Payer: Self-pay | Admitting: Intensive Care

## 2023-10-25 DIAGNOSIS — F1290 Cannabis use, unspecified, uncomplicated: Secondary | ICD-10-CM | POA: Diagnosis not present

## 2023-10-25 DIAGNOSIS — F329 Major depressive disorder, single episode, unspecified: Secondary | ICD-10-CM | POA: Diagnosis not present

## 2023-10-25 DIAGNOSIS — F331 Major depressive disorder, recurrent, moderate: Secondary | ICD-10-CM | POA: Diagnosis not present

## 2023-10-25 DIAGNOSIS — F1120 Opioid dependence, uncomplicated: Secondary | ICD-10-CM | POA: Diagnosis not present

## 2023-10-25 DIAGNOSIS — F1190 Opioid use, unspecified, uncomplicated: Secondary | ICD-10-CM | POA: Diagnosis not present

## 2023-10-25 DIAGNOSIS — R45851 Suicidal ideations: Secondary | ICD-10-CM | POA: Diagnosis not present

## 2023-10-25 DIAGNOSIS — F32A Depression, unspecified: Secondary | ICD-10-CM

## 2023-10-25 DIAGNOSIS — F1914 Other psychoactive substance abuse with psychoactive substance-induced mood disorder: Secondary | ICD-10-CM | POA: Insufficient documentation

## 2023-10-25 LAB — COMPREHENSIVE METABOLIC PANEL
ALT: 17 U/L (ref 0–44)
AST: 18 U/L (ref 15–41)
Albumin: 4.8 g/dL (ref 3.5–5.0)
Alkaline Phosphatase: 63 U/L (ref 38–126)
Anion gap: 10 (ref 5–15)
BUN: 7 mg/dL (ref 6–20)
CO2: 25 mmol/L (ref 22–32)
Calcium: 9.7 mg/dL (ref 8.9–10.3)
Chloride: 105 mmol/L (ref 98–111)
Creatinine, Ser: 0.8 mg/dL (ref 0.44–1.00)
GFR, Estimated: >60 mL/min (ref >60)
Glucose, Bld: 98 mg/dL (ref 70–99)
Potassium: 3.9 mmol/L (ref 3.5–5.1)
Sodium: 140 mmol/L (ref 135–145)
Total Bilirubin: 1.3 mg/dL — ABNORMAL HIGH (ref 0.0–1.2)
Total Protein: 7.7 g/dL (ref 6.5–8.1)

## 2023-10-25 LAB — ETHANOL: Alcohol, Ethyl (B): <10 mg/dL (ref <10)

## 2023-10-25 LAB — CBC
HCT: 47.1 % — ABNORMAL HIGH (ref 36.0–46.0)
Hemoglobin: 16.6 g/dL — ABNORMAL HIGH (ref 12.0–15.0)
MCH: 30 pg (ref 26.0–34.0)
MCHC: 35.2 g/dL (ref 30.0–36.0)
MCV: 85 fL (ref 80.0–100.0)
Platelets: 387 10*3/uL (ref 150–400)
RBC: 5.54 MIL/uL — ABNORMAL HIGH (ref 3.87–5.11)
RDW: 12.6 % (ref 11.5–15.5)
WBC: 9.6 10*3/uL (ref 4.0–10.5)
nRBC: 0 % (ref 0.0–0.2)

## 2023-10-25 LAB — URINE DRUG SCREEN, QUALITATIVE (ARMC ONLY)
Amphetamines, Ur Screen: NOT DETECTED
Barbiturates, Ur Screen: NOT DETECTED
Benzodiazepine, Ur Scrn: NOT DETECTED
Cannabinoid 50 Ng, Ur ~~LOC~~: POSITIVE — AB
Cocaine Metabolite,Ur ~~LOC~~: NOT DETECTED
MDMA (Ecstasy)Ur Screen: NOT DETECTED
Methadone Scn, Ur: NOT DETECTED
Opiate, Ur Screen: NOT DETECTED
Phencyclidine (PCP) Ur S: NOT DETECTED
Tricyclic, Ur Screen: NOT DETECTED

## 2023-10-25 LAB — SALICYLATE LEVEL: Salicylate Lvl: <7 mg/dL — ABNORMAL LOW (ref 7.0–30.0)

## 2023-10-25 LAB — ACETAMINOPHEN LEVEL: Acetaminophen (Tylenol), Serum: <10 ug/mL — ABNORMAL LOW (ref 10–30)

## 2023-10-25 MED ORDER — ONDANSETRON 4 MG PO TBDP
4.0000 mg | ORAL_TABLET | Freq: Three times a day (TID) | ORAL | Status: DC | PRN
  Administered 2023-10-25 – 2023-10-26 (×2): 4 mg via ORAL
  Filled 2023-10-25 (×2): qty 1

## 2023-10-25 MED ORDER — ACETAMINOPHEN 325 MG PO TABS
650.0000 mg | ORAL_TABLET | Freq: Four times a day (QID) | ORAL | Status: DC | PRN
Start: 2023-10-25 — End: 2023-10-26
  Administered 2023-10-25 – 2023-10-26 (×2): 650 mg via ORAL
  Filled 2023-10-25 (×2): qty 2

## 2023-10-25 MED ORDER — HYDROXYZINE HCL 10 MG PO TABS
10.0000 mg | ORAL_TABLET | Freq: Once | ORAL | Status: AC
  Administered 2023-10-25: 10 mg via ORAL
  Filled 2023-10-25: qty 1

## 2023-10-25 NOTE — ED Provider Notes (Signed)
 [facility] Provider Note    None    (approximate)   History   Suicidal   HPI  Alexa DROMGOOLE is a 33 y.o. female who presents to the emergency department today because of concerns for depression and suicidal ideation.  Patient states she has a history of bipolar and depression.  Has not been on her medications for months.  She has been feeling depressed recently.  This morning she had to grab a knife and threatened to cut her wrist.  Her boyfriend intervened before she could cut herself.  Patient states she has history of opioid abuse and is currently going through withdrawals.      Physical Exam   Triage Vital Signs: ED Triage Vitals  Encounter Vitals Group     BP 10/25/23 1417 125/76     Systolic BP Percentile --      Diastolic BP Percentile --      Pulse Rate 10/25/23 1417 79     Resp 10/25/23 1417 16     Temp 10/25/23 1417 98.3 F (36.8 C)     Temp Source 10/25/23 1417 Oral     SpO2 10/25/23 1417 100 %     Weight 10/25/23 1421 185 lb (83.9 kg)     Height 10/25/23 1421 5\' 3"  (1.6 m)     Head Circumference --      Peak Flow --      Pain Score 10/25/23 1421 6     Pain Loc --      Pain Education --      Exclude from Growth Chart --     Most recent vital signs: Vitals:   10/25/23 1417  BP: 125/76  Pulse: 79  Resp: 16  Temp: 98.3 F (36.8 C)  SpO2: 100%   General: Awake, alert, oriented. CV:  Good peripheral perfusion. Regular rate and rhythm. Resp:  Normal effort. Lungs clear. Abd:  No distention.   ED Results / Procedures / Treatments   Labs (all labs ordered are listed, but only abnormal results are displayed) Labs Reviewed  COMPREHENSIVE METABOLIC PANEL - Abnormal; Notable for the following components:      Result Value   Total Bilirubin 1.3 (*)    All other components within normal limits  SALICYLATE LEVEL - Abnormal; Notable for the following components:   Salicylate Lvl <7.0 (*)    All other components within  normal limits  ACETAMINOPHEN LEVEL - Abnormal; Notable for the following components:   Acetaminophen (Tylenol), Serum <10 (*)    All other components within normal limits  CBC - Abnormal; Notable for the following components:   RBC 5.54 (*)    Hemoglobin 16.6 (*)    HCT 47.1 (*)    All other components within normal limits  ETHANOL  URINE DRUG SCREEN, QUALITATIVE ([facility] ONLY)  POC URINE PREG, ED     EKG  None   RADIOLOGY None  PROCEDURES:  Critical Care performed: No    MEDICATIONS ORDERED IN ED: Medications - No data to display   IMPRESSION / MDM / ASSESSMENT AND PLAN / ED COURSE  I reviewed the triage vital signs and the nursing notes.                              Differential diagnosis includes, but is not limited to, depression, drug induced mood disorder  Patient's presentation is most consistent with acute presentation with potential threat to  life or bodily function.   Patient presented to the emergency department today because of concerns for depression and suicidal ideation.  Patient also complaining of some withdrawal symptoms.  Will have psychiatry evaluate.  Will order medication to help with withdrawal symptoms.  The patient has been placed in psychiatric observation due to the need to provide a safe environment for the patient while obtaining psychiatric consultation and evaluation, as well as ongoing medical and medication management to treat the patient's condition.  The patient has not been placed under full IVC at this time.      FINAL CLINICAL IMPRESSION(S) / ED DIAGNOSES   Final diagnoses:  Depression, unspecified depression type  Opioid use disorder     Note:  This document was prepared using Dragon voice recognition software and may include unintentional dictation errors.    Phineas Semen, MD 10/25/23 [phone]

## 2023-10-25 NOTE — ED Notes (Signed)
 VOL/  PENDING  CONSULT

## 2023-10-25 NOTE — ED Triage Notes (Signed)
 Patient presents with suicidal thoughts. Her plan was to cut herself today and her boyfriend stopped her.   Reports history of hurting self. Has not been on her medication in months. Diagnosed with bipolar depression, anxiety, and PTSD  Reports she is detoxing from heroin and fentanyl. Last use about 20 hours ago per patient

## 2023-10-26 ENCOUNTER — Encounter (HOSPITAL_COMMUNITY): Payer: Self-pay | Admitting: Psychiatry

## 2023-10-26 ENCOUNTER — Other Ambulatory Visit: Payer: Self-pay

## 2023-10-26 ENCOUNTER — Inpatient Hospital Stay (HOSPITAL_COMMUNITY)
Admission: AD | Admit: 2023-10-26 | Discharge: 2023-11-01 | DRG: 897 | Disposition: A | Discharge status: Home / Self Care | Source: Intra-hospital | Attending: Psychiatry | Admitting: Psychiatry

## 2023-10-26 DIAGNOSIS — F1123 Opioid dependence with withdrawal: Secondary | ICD-10-CM | POA: Diagnosis present

## 2023-10-26 DIAGNOSIS — Z888 Allergy status to other drugs, medicaments and biological substances status: Secondary | ICD-10-CM

## 2023-10-26 DIAGNOSIS — F410 Panic disorder [episodic paroxysmal anxiety] without agoraphobia: Secondary | ICD-10-CM | POA: Diagnosis present

## 2023-10-26 DIAGNOSIS — F1994 Other psychoactive substance use, unspecified with psychoactive substance-induced mood disorder: Secondary | ICD-10-CM | POA: Diagnosis not present

## 2023-10-26 DIAGNOSIS — Z79899 Other long term (current) drug therapy: Secondary | ICD-10-CM

## 2023-10-26 DIAGNOSIS — Z811 Family history of alcohol abuse and dependence: Secondary | ICD-10-CM

## 2023-10-26 DIAGNOSIS — Z9151 Personal history of suicidal behavior: Secondary | ICD-10-CM

## 2023-10-26 DIAGNOSIS — F1120 Opioid dependence, uncomplicated: Secondary | ICD-10-CM | POA: Diagnosis not present

## 2023-10-26 DIAGNOSIS — F331 Major depressive disorder, recurrent, moderate: Secondary | ICD-10-CM

## 2023-10-26 DIAGNOSIS — Z560 Unemployment, unspecified: Secondary | ICD-10-CM

## 2023-10-26 DIAGNOSIS — F1290 Cannabis use, unspecified, uncomplicated: Secondary | ICD-10-CM | POA: Diagnosis not present

## 2023-10-26 DIAGNOSIS — Z5986 Financial insecurity: Secondary | ICD-10-CM

## 2023-10-26 DIAGNOSIS — R45851 Suicidal ideations: Secondary | ICD-10-CM

## 2023-10-26 DIAGNOSIS — E039 Hypothyroidism, unspecified: Secondary | ICD-10-CM | POA: Diagnosis present

## 2023-10-26 DIAGNOSIS — Z818 Family history of other mental and behavioral disorders: Secondary | ICD-10-CM

## 2023-10-26 DIAGNOSIS — Z813 Family history of other psychoactive substance abuse and dependence: Secondary | ICD-10-CM | POA: Diagnosis not present

## 2023-10-26 DIAGNOSIS — Z8249 Family history of ischemic heart disease and other diseases of the circulatory system: Secondary | ICD-10-CM | POA: Diagnosis not present

## 2023-10-26 DIAGNOSIS — E538 Deficiency of other specified B group vitamins: Secondary | ICD-10-CM | POA: Diagnosis present

## 2023-10-26 DIAGNOSIS — E559 Vitamin D deficiency, unspecified: Secondary | ICD-10-CM | POA: Diagnosis present

## 2023-10-26 DIAGNOSIS — F1190 Opioid use, unspecified, uncomplicated: Secondary | ICD-10-CM

## 2023-10-26 DIAGNOSIS — R030 Elevated blood-pressure reading, without diagnosis of hypertension: Secondary | ICD-10-CM | POA: Diagnosis present

## 2023-10-26 DIAGNOSIS — F1193 Opioid use, unspecified with withdrawal: Secondary | ICD-10-CM | POA: Diagnosis present

## 2023-10-26 DIAGNOSIS — Z833 Family history of diabetes mellitus: Secondary | ICD-10-CM

## 2023-10-26 DIAGNOSIS — F1124 Opioid dependence with opioid-induced mood disorder: Principal | ICD-10-CM | POA: Diagnosis present

## 2023-10-26 DIAGNOSIS — F17210 Nicotine dependence, cigarettes, uncomplicated: Secondary | ICD-10-CM | POA: Diagnosis present

## 2023-10-26 HISTORY — DX: Nicotine dependence, unspecified, uncomplicated: F17.200

## 2023-10-26 HISTORY — DX: Panic disorder (episodic paroxysmal anxiety): F41.0

## 2023-10-26 HISTORY — DX: Major depressive disorder, recurrent, moderate: F33.1

## 2023-10-26 HISTORY — DX: Intentional self-harm by unspecified sharp object, initial encounter: X78.9XXA

## 2023-10-26 MED ORDER — DIPHENHYDRAMINE HCL 50 MG/ML IJ SOLN: 50.0000 mg | Freq: Three times a day (TID) | INTRAMUSCULAR | Status: DC | PRN

## 2023-10-26 MED ORDER — HALOPERIDOL LACTATE 5 MG/ML IJ SOLN: 10.0000 mg | Freq: Three times a day (TID) | INTRAMUSCULAR | Status: DC | PRN

## 2023-10-26 MED ORDER — LORAZEPAM 2 MG/ML IJ SOLN: 2.0000 mg | Freq: Three times a day (TID) | INTRAMUSCULAR | Status: DC | PRN

## 2023-10-26 MED ORDER — NAPROXEN 500 MG PO TABS
500.0000 mg | ORAL_TABLET | Freq: Two times a day (BID) | ORAL | Status: AC | PRN
  Administered 2023-10-26 – 2023-10-31 (×9): 500 mg via ORAL
  Filled 2023-10-26 (×9): qty 1

## 2023-10-26 MED ORDER — HALOPERIDOL 5 MG PO TABS
5.0000 mg | ORAL_TABLET | Freq: Three times a day (TID) | ORAL | Status: DC | PRN
Start: 2023-10-26 — End: 2023-11-01

## 2023-10-26 MED ORDER — LOPERAMIDE HCL 2 MG PO CAPS: 2.0000 mg | ORAL_CAPSULE | Freq: Four times a day (QID) | ORAL | Status: DC | PRN

## 2023-10-26 MED ORDER — ALUM & MAG HYDROXIDE-SIMETH 200-200-20 MG/5ML PO SUSP: 30.0000 mL | ORAL | Status: DC | PRN

## 2023-10-26 MED ORDER — TRAZODONE HCL 50 MG PO TABS: 50.0000 mg | ORAL_TABLET | Freq: Every evening | ORAL | Status: DC | PRN

## 2023-10-26 MED ORDER — DIPHENHYDRAMINE HCL 25 MG PO CAPS: 50.0000 mg | ORAL_CAPSULE | Freq: Three times a day (TID) | ORAL | Status: DC | PRN

## 2023-10-26 MED ORDER — METHOCARBAMOL 500 MG PO TABS
500.0000 mg | ORAL_TABLET | Freq: Three times a day (TID) | ORAL | Status: AC | PRN
  Administered 2023-10-26 – 2023-10-31 (×8): 500 mg via ORAL
  Filled 2023-10-26 (×9): qty 1

## 2023-10-26 MED ORDER — HYDROXYZINE HCL 25 MG PO TABS
25.0000 mg | ORAL_TABLET | Freq: Four times a day (QID) | ORAL | Status: DC | PRN
  Administered 2023-10-26 – 2023-10-27 (×3): 25 mg via ORAL
  Filled 2023-10-26 (×3): qty 1

## 2023-10-26 MED ORDER — MAGNESIUM HYDROXIDE 400 MG/5ML PO SUSP
30.0000 mL | Freq: Every day | ORAL | Status: DC | PRN
  Administered 2023-10-28: 30 mL via ORAL
  Filled 2023-10-26: qty 30

## 2023-10-26 MED ORDER — DICYCLOMINE HCL 20 MG PO TABS
20.0000 mg | ORAL_TABLET | Freq: Four times a day (QID) | ORAL | Status: AC | PRN
  Administered 2023-10-27: 20 mg via ORAL
  Filled 2023-10-26: qty 1

## 2023-10-26 MED ORDER — HYDROXYZINE HCL 25 MG PO TABS
25.0000 mg | ORAL_TABLET | Freq: Once | ORAL | Status: AC
  Administered 2023-10-26: 25 mg via ORAL
  Filled 2023-10-26: qty 1

## 2023-10-26 MED ORDER — LOPERAMIDE HCL 2 MG PO CAPS: 2.0000 mg | ORAL_CAPSULE | ORAL | Status: AC | PRN

## 2023-10-26 MED ORDER — NICOTINE 21 MG/24HR TD PT24
21.0000 mg | MEDICATED_PATCH | Freq: Every day | TRANSDERMAL | Status: DC
  Administered 2023-10-26 – 2023-10-27 (×2): 21 mg via TRANSDERMAL
  Filled 2023-10-26 (×4): qty 1

## 2023-10-26 MED ORDER — HALOPERIDOL LACTATE 5 MG/ML IJ SOLN: 5.0000 mg | Freq: Three times a day (TID) | INTRAMUSCULAR | Status: DC | PRN

## 2023-10-26 MED ORDER — CLONIDINE HCL 0.1 MG PO TABS: 0.1000 mg | ORAL_TABLET | Freq: Four times a day (QID) | ORAL | Status: DC | PRN

## 2023-10-26 NOTE — ED Notes (Addendum)
 [facility] is here to transport patient to [facility]. Patient belongings given to driver.

## 2023-10-26 NOTE — ED Notes (Signed)
VOL/Pending placement 

## 2023-10-26 NOTE — ED Notes (Signed)
 [facility] here for patient at this time.

## 2023-10-26 NOTE — ED Notes (Signed)
 Hospital meal provided, pt tolerated w/o complaints.  Waste discarded appropriately.

## 2023-10-26 NOTE — ED Notes (Signed)
 Unit Secretary notified RN of patients father attempting to contact her. Writer offered patient phone to return call. Patient states she " declines to speak with anyone at this time."

## 2023-10-26 NOTE — Tx Team (Signed)
 Initial Treatment Plan 10/26/2023 6:36 PM Burgess Amor Bartell ZOX:[id number]    PATIENT STRESSORS: Substance abuse     PATIENT STRENGTHS: Motivation for treatment/growth  Supportive family/friends    PATIENT IDENTIFIED PROBLEMS: " Trying to get clean"                     DISCHARGE CRITERIA:  Motivation to continue treatment in a less acute level of care Verbal commitment to aftercare and medication compliance Withdrawal symptoms are absent or subacute and managed without 24-hour nursing intervention  PRELIMINARY DISCHARGE PLAN: Outpatient therapy Return to previous living arrangement  PATIENT/FAMILY INVOLVEMENT: This treatment plan has been presented to and reviewed with the patient, Alexa Guerra.  The patient and family has been given the opportunity to ask questions and make suggestions.  Rosita Fire, RN 10/26/2023, 6:36 PM

## 2023-10-26 NOTE — ED Provider Notes (Signed)
 Emergency Medicine Observation Re-evaluation Note  Alexa Guerra is a 33 y.o. female, seen on rounds today.  Pt initially presented to the ED for complaints of Suicidal  Currently, the patient is no acute distress. Sleeping no issues  Physical Exam  Blood pressure 121/72, pulse 78, temperature 98.5 F (36.9 C), temperature source Oral, resp. rate 19, height 5\' 3"  (1.6 m), weight 83.9 kg, SpO2 98%.  Physical Exam General: No apparent distress Pulm: Normal WOB Psych: resting     ED Course / MDM     I have reviewed the labs performed to date as well as medications administered while in observation.  Recent changes in the last 24 hours include none   Plan   Current plan is to continue to wait for psych placement  Patient is not under full IVC at this time.   Concha Se, MD 10/26/23 [phone]

## 2023-10-26 NOTE — Plan of Care (Signed)
   Problem: Education: Goal: Knowledge of [place] [facility] Education information/materials will improve Outcome: Progressing   Problem: Safety: Goal: Periods of time without injury will increase Outcome: Progressing
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
## 2023-10-26 NOTE — ED Notes (Signed)
 Patient using phone at this time speaking with father. Patient agreed to allow staff to obtain collateral information from father.

## 2023-10-26 NOTE — Plan of Care (Signed)
   Problem: Education: Goal: Emotional status will improve Outcome: Progressing Goal: Mental status will improve Outcome: Progressing   Problem: Activity: Goal: Interest or engagement in activities will improve Outcome: Progressing Goal: Sleeping patterns will improve Outcome: Progressing

## 2023-10-26 NOTE — ED Notes (Addendum)
  [facility] spoke with patient's RN, [name] while patient is awaiting inpatient treatment.  [name], RN confirmed patient can ambulate and perform ADLs on her own without assistance.  Patient is cooperative and looking forward to a positive inpatient experience.   Alexa Guerra, [facility]

## 2023-10-26 NOTE — Progress Notes (Signed)
 Patient is a 33 year old female admitted  voluntarily to unit from [facility]-ED for  suicidal ideation by grabbing a knife to cut her wrist and requesting detox from Fentanyl and Heroin. Pt stated that she smokes or snort 11/2  grams of heroin or fentanyl daily and is currently experiencing withdrawal symptoms, sweats and muscle spasm. Pt stated, " It happens when I move around". Pt denied pain and spasm during admission assessment. Pt stated, " The last time I used was a day and a half ago I don't know how it didn't show up in my system". Pt denies current SI/HI and AVH. Skin and safety check completed. Tattoo observed to pt's lower back, no contraband found. 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 Pt escorted on unit and orientation provided with verbalization of understanding. Safety maintained.  10/26/23 1745  Psych Admission Type (Psych Patients Only)  Admission Status Voluntary  Psychosocial Assessment  Patient Complaints Anxiety;Depression;Substance abuse  Eye Contact Brief  Facial Expression Anxious  Affect Anxious;Depressed;Irritable  Speech Logical/coherent  Interaction Assertive  Motor Activity Slow  Appearance/Hygiene Body odor;In scrubs  Behavior Characteristics Anxious;Irritable  Mood Anxious;Depressed;Irritable  Thought Process  Coherency WDL  Content WDL  Delusions None reported or observed  Perception WDL  Hallucination None reported or observed  Judgment Impaired  Confusion None  Danger to Self  Current suicidal ideation? Denies  Danger to Others  Danger to Others None reported or observed

## 2023-10-26 NOTE — ED Notes (Signed)
 EMTALA reviewed by this

## 2023-10-26 NOTE — ED Notes (Signed)
 [facility] not at correct facility. Patient back in room until transport arrives.

## 2023-10-26 NOTE — Consult Note (Signed)
 [facility] Consult Note  Patient Name: Alexa Guerra MRN: [id number] DOB: 06-Jul-1991 DATE OF Consult: 10/26/2023  PRIMARY PSYCHIATRIC DIAGNOSES  - Opioid Use Disorder, Severe  - Major Depressive Disorder - Cannabis Use Disorder  RECOMMENDATIONS  Admit to inpatient psych for safety and stabilization  Medication recommendations: Start opioid withdrawal protocol:  Clonidine 0.1mg  Q6H PRN,  Loperamide 2mg  Q6H PRN, Trazodone 50mg  PO QHS PRN Continue Tylenol 650mg  PO Q6H PRN and Ondansetron 4mg  Q8H PRN  Non-Medication/therapeutic recommendations: Refer to outpatient psychiatric provider for medication management, therapy, and substance abuse treatment upon discharge from inpatient psych admission.   Communication: Treatment team members (and family members if applicable) who were involved in treatment/care discussions and planning, and with whom we spoke or engaged with via secure text/chat, include the following:  patient's treatment team.   Thank you for involving [place] in the care of this patient. If you have any additional questions or concerns, please call [phone] and ask for me or the provider on-call.  TELEPSYCHIATRY ATTESTATION & CONSENT  As the provider for this telehealth consult, I attest that I verified the patient's identity using two separate identifiers, introduced myself to the patient, provided my credentials, disclosed my location, and performed this encounter via a HIPAA-compliant, real-time, face-to-face, two-way, interactive audio and video platform and with the full consent and agreement of the patient (or guardian as applicable.)  Patient physical location: [place]. Telehealth provider physical location: home office in state of [place].  Video start time: 0346 (Central Time) Video end time: 0357 (Central Time)  IDENTIFYING DATA  PREETI WINEGARDNER is a 34 y.o. year-old female for whom a psychiatric consultation has been ordered by the primary provider. The  patient was identified using two separate identifiers.  CHIEF COMPLAINT/REASON FOR CONSULT  - "I came in because I was suicidal, and I'm also detoxing."  HISTORY OF PRESENT ILLNESS (HPI)  The patient, a 33 year old woman, presented with acute suicidal ideation and is currently undergoing detoxification from heroin and fentanyl. She reported using approximately a gram and half gram of these substances daily, by smoking. Despite her claims of recent use, toxicology results showed no opioids in her system, which raised questions about the accuracy of her self-reported substance use. UDS was only positive for marijuana. She expressed confusion and insisted on the truthfulness of her statements regarding her detoxification process.  Suicidal thoughts have been persistent, with a recent attempt to cut her wrists thwarted by her boyfriend. She expressed feelings of depression, hopelessness, and worthlessness, and acknowledged having a plan to harm herself if released from care. Her mental health history does not include previous admissions to psychiatric facilities or rehabilitation centers, although she reports that she has attempted to enter rehab multiple times, facing barriers such as high blood pressure.  The patient denied experiencing auditory or visual hallucinations. She reported occasional use of marijuana, specifically one to two blunts, to aid sleep, but not on a nightly basis. Cigarette smoking, a pack a day was confirmed, while alcohol use was denied.  Family history revealed an uncle who suffered from depression and committed suicide in the early 2000s. The patient lives with her boyfriend, who also uses heroin, in a precarious housing situation. They face imminent eviction due to financial difficulties, compounded by the loss of their car and jobs. Currently, they reside in a house without power.  Throughout the evaluation, the patient was cooperative but appeared distressed and anxious about  her circumstances. It was recommended that she receive further evaluation  and support to address her suicidal ideation and substance use issues. The patient's boyfriend, who accompanied her to the hospital and apparently checked himself into the hospital was also noted to have similar issues, yet his toxicology results mirrored hers, showing no opioids in his system.  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   OLANZapine (ZYPREXA) 10 MG tablet TAKE 1 TABLET BY MOUTH EVERY DAY **DISCONTINUE OLANZAPINE-FLUOXETINE**   Multiple Vitamin (MULTIVITAMIN) tablet Take 1 tablet by mouth daily.   ondansetron (ZOFRAN ODT) 4 MG disintegrating tablet Take 1 tablet (4 mg total) by mouth every 8 (eight) hours as needed for nausea or vomiting.   norethindrone (MICRONOR,CAMILA,ERRIN) 0.35 MG tablet Take 1 tablet (0.35 mg total) by mouth daily.   FLUoxetine (PROZAC) 40 MG capsule Take by mouth.     ALLERGIES  Allergies  Allergen Reactions   Other Other (See Comments)    Pt reports a reaction  "vital sign changes" to a beta blocker. She does not know the name of the medication.   Beta Adrenergic Blockers Other (See Comments)    Vitals dropped "I was pretty much dying"    SOCIAL & SUBSTANCE USE HISTORY  Social History   Socioeconomic History   Marital status: Significant Other    Spouse name: Not on file   Number of children: Not on file   Years of education: Not on file   Highest education level: Not on file  Occupational History   Not on file  Tobacco Use   Smoking status: Every Day    Current packs/day: 0.25    Types: Cigarettes   Smokeless tobacco: Never  Vaping Use   Vaping status: Never Used  Substance and Sexual Activity   Alcohol use: Not Currently    Comment: Occasionally   Drug use: Yes    Types: Marijuana    Comment: heroin and fentanyl   Sexual activity: Yes    Birth control/protection: None  Other Topics Concern   Not on file  Social History Narrative   Not on file   Social Drivers of Health   Financial Resource Strain: Not on file  Food Insecurity: Not on file  Transportation Needs: Not on file  Physical Activity: Not on file  Stress: Not on file  Social Connections: Not on file   Social History   Tobacco Use  Smoking Status Every Day   Current packs/day: 0.25   Types: Cigarettes  Smokeless Tobacco Never  Social History   Substance and Sexual Activity  Alcohol Use Not Currently   Comment: Occasionally   Social History   Substance and Sexual Activity  Drug Use Yes   Types: Marijuana   Comment: heroin and fentanyl    Additional pertinent information .  FAMILY HISTORY  Family History  Problem Relation Age of Onset   Drug abuse Mother    Cancer Mother        cervix   Alcohol abuse Father    Hypertension Maternal Grandmother    Diabetes Maternal Grandmother    Hypertension Maternal Grandfather    Diabetes Maternal Grandfather    Hypertension Paternal Grandmother    Hypertension Paternal Grandfather    Family  Psychiatric History (if known):  Family history revealed an uncle who suffered from depression and committed suicide in the early 2000s.  MENTAL STATUS EXAM (MSE)  Mental Status Exam: General Appearance: wearing hospital scrubs  Orientation:  Full (Time, Place, and Person)  Memory:  Immediate;   Good Recent;   Good Remote;   Good  Concentration:  Concentration: Fair and Attention Span: Fair  Recall:  patient is hard of hearing  Attention  Fair  Eye Contact:  Fair  Speech:  Normal Rate  Language:  Good  Volume:  Normal  Mood: She described her mood as "depressed" and "hopeless."  Affect:  was congruent with the content discussed, displaying a subdued attitude as she expressed feelings of depression, hopelessness, and worthlessness.  Thought Process:  Goal Directed and Linear  Thought Content:  revealed significant themes of hopelessness and worthlessness, with the patient expressing suicidal ideation and a plan to harm herself if released  Suicidal Thoughts:  Yes.  with intent/plan  Homicidal Thoughts:  No  Judgement:  Fair  Insight:  Fair  Psychomotor Activity:  Negative  Akathisia:  Negative  Fund of Knowledge:  Good    Assets:  Communication Skills Desire for Improvement  Cognition:  WNL  ADL's:  Intact  AIMS (if indicated):       VITALS  Blood pressure 121/72, pulse 78, temperature 98.5 F (36.9 C), temperature source Oral, resp. rate 19, height 5\' 3"  (1.6 m), weight 83.9 kg, SpO2 98%.  LABS  Admission on 10/25/2023  Component Date Value Ref Range Status   Sodium 10/25/2023 140  135 - 145 mmol/L Final   Potassium 10/25/2023 3.9  3.5 - 5.1 mmol/L Final   Chloride 10/25/2023 105  98 - 111 mmol/L Final   CO2 10/25/2023 25  22 - 32 mmol/L Final   Glucose, Bld 10/25/2023 98  70 - 99 mg/dL Final   Glucose reference range applies only to samples taken after fasting for at least 8 hours.   BUN 10/25/2023 7  6 - 20 mg/dL Final   Creatinine, Ser 10/25/2023 0.80  0.44 - 1.00  mg/dL Final   Calcium 64/33/2951 9.7  8.9 - 10.3 mg/dL Final   Total Protein 88/41/6606 7.7  6.5 - 8.1 g/dL Final   Albumin 30/16/0109 4.8  3.5 - 5.0 g/dL Final   AST 32/35/5732 18  15 - 41 U/L Final   ALT 10/25/2023 17  0 - 44 U/L Final   Alkaline Phosphatase 10/25/2023 63  38 - 126 U/L Final   Total Bilirubin 10/25/2023 1.3 (H)  0.0 - 1.2 mg/dL Final   GFR, Estimated 10/25/2023 >60  >60 mL/min Final   Comment: (NOTE) Calculated using the CKD-EPI Creatinine Equation (2021)    Anion gap 10/25/2023 10  5 -  15 Final   Performed at [facility], [address] [place], [place] [address]   Alcohol, Ethyl (B) 10/25/2023 <10  <10 mg/dL Final   Comment: (NOTE) Lowest detectable limit for serum alcohol is 10 mg/dL.  For medical purposes only. Performed at [facility], [address] [place], [place] [address]    Salicylate Lvl 10/25/2023 <7.0 (L)  7.0 - 30.0 mg/dL Final   Performed at [facility], [address] [place], [place] [address]   Acetaminophen (Tylenol), Serum 10/25/2023 <10 (L)  10 - 30 ug/mL Final   Comment: (NOTE) Therapeutic concentrations vary significantly. A range of 10-30 ug/mL  may be an effective concentration for many patients. However, some  are best treated at concentrations outside of this range. Acetaminophen concentrations >150 ug/mL at 4 hours after ingestion  and >50 ug/mL at 12 hours after ingestion are often associated with  toxic reactions.  Performed at [facility], [address] [place], [place] [address]    WBC 10/25/2023 9.6  4.0 - 10.5 K/uL Final   RBC 10/25/2023 5.54 (H)  3.87 - 5.11 MIL/uL Final   Hemoglobin 10/25/2023 16.6 (H)  12.0 - 15.0 g/dL Final   HCT 29/56/2130 47.1 (H)  36.0 - 46.0 % Final   MCV 10/25/2023 85.0  80.0 - 100.0 fL Final   MCH 10/25/2023 30.0  26.0 - 34.0 pg Final   MCHC 10/25/2023 35.2  30.0 - 36.0 g/dL Final   RDW 86/57/8469 12.6  11.5 - 15.5 % Final   Platelets 10/25/2023  387  150 - 400 K/uL Final   nRBC 10/25/2023 0.0  0.0 - 0.2 % Final   Performed at [facility], [address] [place], [place] [address]   Tricyclic, Ur Screen 10/25/2023 NONE DETECTED  NONE DETECTED Final   Amphetamines, Ur Screen 10/25/2023 NONE DETECTED  NONE DETECTED Final   MDMA (Ecstasy)Ur Screen 10/25/2023 NONE DETECTED  NONE DETECTED Final   Cocaine Metabolite,Ur [place] 10/25/2023 NONE DETECTED  NONE DETECTED Final   Opiate, Ur Screen 10/25/2023 NONE DETECTED  NONE DETECTED Final   Phencyclidine (PCP) Ur S 10/25/2023 NONE DETECTED  NONE DETECTED Final   Cannabinoid 50 Ng, Ur [place] 10/25/2023 POSITIVE (A)  NONE DETECTED Final   Barbiturates, Ur Screen 10/25/2023 NONE DETECTED  NONE DETECTED Final   Benzodiazepine, Ur Scrn 10/25/2023 NONE DETECTED  NONE DETECTED Final   Methadone Scn, Ur 10/25/2023 NONE DETECTED  NONE DETECTED Final   Comment: (NOTE) Tricyclics + metabolites, urine    Cutoff 1000 ng/mL Amphetamines + metabolites, urine  Cutoff 1000 ng/mL MDMA (Ecstasy), urine              Cutoff 500 ng/mL Cocaine Metabolite, urine          Cutoff 300 ng/mL Opiate + metabolites, urine        Cutoff 300 ng/mL Phencyclidine (PCP), urine         Cutoff 25 ng/mL Cannabinoid, urine                 Cutoff 50 ng/mL Barbiturates + metabolites, urine  Cutoff 200 ng/mL Benzodiazepine, urine              Cutoff 200 ng/mL Methadone, urine                   Cutoff 300 ng/mL  The urine drug screen provides only a preliminary, unconfirmed analytical test result and should not be used for non-medical purposes. Clinical consideration  and professional judgment should be applied to any positive drug screen result due to possible interfering substances. A more specific alternate chemical method must be used in order to obtain a confirmed analytical result. Gas chromatography / mass spectrometry (GC/MS) is the preferred confirm                          atory method. Performed at [facility], [address] [place], [place] [address]     PSYCHIATRIC REVIEW OF SYSTEMS (ROS)  ROS: Notable for the following relevant positive findings: ROS  Additional findings:      Musculoskeletal: No abnormal movements observed      Gait & Station: Laying/Sitting      Pain Screening: Present - mild to moderate      Nutrition & Dental Concerns: Decrease in food intake and/or loss of appetite  RISK FORMULATION/ASSESSMENT  Is the patient experiencing any suicidal or homicidal ideations: Yes       Explain if yes: has plan to cut herself Protective factors considered for safety management: has access to appropriate clinical services  Risk factors/concerns considered for safety management:  Family history of suicide Depression Substance abuse/dependence Hopelessness Unmarried  Is there a safety management plan with the patient and treatment team to minimize risk factors and promote protective factors: Yes           Explain: admit to psych Is crisis care placement or psychiatric hospitalization recommended: Yes     Based on my current evaluation and risk assessment, patient is determined at this time to be at:  Moderate Risk  Assessment  The patient is a 33 year old woman presenting with opioid use disorder, primarily involving heroin and fentanyl, alongside cannabis use. She reports using heroin and fentanyl up a gram and  half  daily, by smoking . Despite her claims of recent use, urine toxicology was negative for opioids, raising questions about the accuracy of her self-reported substance use. She also smokes cannabis occasionally to aid sleep. There is a family history of depression and suicide, as her uncle committed suicide in the early 2000s. She reports experiencing withdrawal symptoms, including, cold sweats, body ache, nausea, insomnia and panic attacks, and has not been admitted to a mental health facility or rehab previously, although she has attempted to seek  rehabilitation.  Psychologically, the patient report experiencing  depressive symptoms, including suicidal ideation, hopelessness, and worthlessness. She attempted to cut her wrists recently, an act interrupted by her boyfriend. She expresses a lack of a concrete plan but fears she might attempt self-harm again if released. Her mood is significantly affected, and she feels isolated, with no current therapeutic support. Her coping mechanisms appear limited, and she struggles with compliance to any form of treatment, as evidenced by her difficulty accessing rehab services.  Socially, the patient lives with her boyfriend, who also uses heroin, in a precarious housing situation. They are behind on rent, living without power, and face potential eviction. The loss of their car and subsequent job loss have exacerbated their financial instability. She lacks a support network, as her boyfriend is her primary companion, and she does not have a phone to maintain contact with others. This isolation and financial stress contribute to her overall mental health challenges.  *RISK ASSESSMENT Risk assessment is a dynamic process; it is possible that this patient's condition, and risk level, may change. This should be re-evaluated and managed over time as appropriate. Please re-consult psychiatric consult services  if additional assistance is needed in terms of risk assessment and management. If your team decides to discharge this patient, please advise the patient how to best access emergency psychiatric services, or to call 911, if their condition worsens or they feel unsafe in any way.   Norval Morton, NP Tele[facility] Consult Services

## 2023-10-27 ENCOUNTER — Encounter (HOSPITAL_COMMUNITY): Payer: Self-pay | Admitting: Psychiatry

## 2023-10-27 DIAGNOSIS — F1120 Opioid dependence, uncomplicated: Secondary | ICD-10-CM | POA: Diagnosis present

## 2023-10-27 DIAGNOSIS — F1994 Other psychoactive substance use, unspecified with psychoactive substance-induced mood disorder: Secondary | ICD-10-CM | POA: Diagnosis not present

## 2023-10-27 DIAGNOSIS — F1193 Opioid use, unspecified with withdrawal: Secondary | ICD-10-CM | POA: Diagnosis present

## 2023-10-27 HISTORY — DX: Opioid dependence, uncomplicated: F11.20

## 2023-10-27 LAB — T4, FREE: Free T4: 1.01 ng/dL (ref 0.61–1.12)

## 2023-10-27 LAB — LIPID PANEL
Cholesterol: 136 mg/dL (ref 0–200)
HDL: 40 mg/dL — ABNORMAL LOW (ref >40)
LDL Cholesterol: 77 mg/dL (ref 0–99)
Total CHOL/HDL Ratio: 3.4 ratio
Triglycerides: 96 mg/dL (ref <150)
VLDL: 19 mg/dL (ref 0–40)

## 2023-10-27 LAB — VITAMIN B12: Vitamin B-12: 164 pg/mL — ABNORMAL LOW (ref 180–914)

## 2023-10-27 LAB — VITAMIN D 25 HYDROXY (VIT D DEFICIENCY, FRACTURES): Vit D, 25-Hydroxy: 20.68 ng/mL — ABNORMAL LOW (ref 30–100)

## 2023-10-27 LAB — TSH: TSH: 0.93 u[IU]/mL (ref 0.350–4.500)

## 2023-10-27 LAB — HEMOGLOBIN A1C
Hgb A1c MFr Bld: 5 % (ref 4.8–5.6)
Mean Plasma Glucose: 96.8 mg/dL

## 2023-10-27 LAB — FOLATE: Folate: 10 ng/mL (ref >5.9)

## 2023-10-27 LAB — HIV ANTIBODY (ROUTINE TESTING W REFLEX): HIV Screen 4th Generation wRfx: NONREACTIVE

## 2023-10-27 MED ORDER — BUSPIRONE HCL 5 MG PO TABS
5.0000 mg | ORAL_TABLET | Freq: Three times a day (TID) | ORAL | Status: DC
  Administered 2023-10-27 – 2023-11-01 (×16): 5 mg via ORAL
  Filled 2023-10-27 (×24): qty 1

## 2023-10-27 MED ORDER — CHLORPROMAZINE HCL 25 MG/ML IJ SOLN: 25.0000 mg | Freq: Three times a day (TID) | INTRAMUSCULAR | Status: DC | PRN

## 2023-10-27 MED ORDER — TRAZODONE HCL 50 MG PO TABS
50.0000 mg | ORAL_TABLET | Freq: Every day | ORAL | Status: DC
  Administered 2023-10-27 – 2023-10-29 (×3): 50 mg via ORAL
  Filled 2023-10-27 (×5): qty 1

## 2023-10-27 MED ORDER — NICOTINE POLACRILEX 2 MG MT GUM
2.0000 mg | CHEWING_GUM | OROMUCOSAL | Status: DC | PRN
  Administered 2023-10-27 – 2023-11-01 (×19): 2 mg via ORAL
  Filled 2023-10-27 (×2): qty 1

## 2023-10-27 MED ORDER — HYDROXYZINE HCL 25 MG PO TABS
25.0000 mg | ORAL_TABLET | Freq: Three times a day (TID) | ORAL | Status: DC
  Administered 2023-10-27 – 2023-11-01 (×16): 25 mg via ORAL
  Filled 2023-10-27 (×24): qty 1

## 2023-10-27 MED ORDER — HYDROXYZINE HCL 50 MG/ML IM SOLN: 25.0000 mg | Freq: Three times a day (TID) | INTRAMUSCULAR | Status: DC

## 2023-10-27 MED ORDER — ONDANSETRON 4 MG PO TBDP
4.0000 mg | ORAL_TABLET | Freq: Three times a day (TID) | ORAL | Status: DC | PRN
  Administered 2023-10-27 – 2023-10-29 (×2): 4 mg via ORAL
  Filled 2023-10-27 (×2): qty 1

## 2023-10-27 MED ORDER — LURASIDONE HCL 20 MG PO TABS
20.0000 mg | ORAL_TABLET | Freq: Every day | ORAL | Status: DC
  Administered 2023-10-27 – 2023-10-30 (×4): 20 mg via ORAL
  Filled 2023-10-27 (×6): qty 1

## 2023-10-27 MED ORDER — ESCITALOPRAM OXALATE 10 MG PO TABS
10.0000 mg | ORAL_TABLET | Freq: Every day | ORAL | Status: DC
  Administered 2023-10-27 – 2023-10-31 (×5): 10 mg via ORAL
  Filled 2023-10-27 (×7): qty 1

## 2023-10-27 NOTE — Group Note (Signed)
 Date:  10/27/2023 Time:  4:00 PM  Group Topic/Focus:  Personal Choices and Values:   The focus of this group is to help patients assess and explore the importance of values in their lives, how their values affect their decisions, how they express their values and what opposes their expression.    Participation Level:  Active  Participation Quality:  Appropriate and Attentive  Affect:  Flat  Cognitive:  Alert and Appropriate  Insight: Appropriate and Improving  Engagement in Group:  Engaged and Improving  Modes of Intervention:  Activity, Discussion, Exploration, Rapport Building, Socialization, and Support  Additional Comments:    Shela Nevin 10/27/2023, 4:00 PM

## 2023-10-27 NOTE — Plan of Care (Signed)
   Problem: Education: Goal: Emotional status will improve Outcome: Progressing Goal: Mental status will improve Outcome: Progressing   Problem: Activity: Goal: Interest or engagement in activities will improve Outcome: Progressing Goal: Sleeping patterns will improve Outcome: Progressing   Problem: Safety: Goal: Periods of time without injury will increase Outcome: Progressing

## 2023-10-27 NOTE — Progress Notes (Addendum)
 D: Patient is alert, oriented, and cooperative. Patient endorsing passive SI. Denies SI, HI, AVH, and verbally contracts for safety. Patient reports she slept poor last night with sleeping medication. Patient reports her appetite as fair, energy level as low, and concentration as poor. Patient rates her depression 9/10, hopelessness 9/10, and anxiety 10/10. Patient reports leg pain. Patient reports nausea.    A: Scheduled medications administered per MD order. PRN hydroxyzine, naproxen, zofran, and nicotine gum administered. Support provided. Patient educated on safety on the unit and medications. Routine safety checks every 15 minutes. Patient stated understanding to tell nurse about any new physical symptoms. Patient understands to tell staff of any needs.     R: No adverse drug reactions noted. Patient remains safe at this time and will continue to monitor.    10/27/23 1400  Psych Admission Type (Psych Patients Only)  Admission Status Voluntary  Psychosocial Assessment  Patient Complaints Anxiety;Depression  Eye Contact Fair  Facial Expression Anxious  Affect Anxious;Depressed  Speech Logical/coherent  Interaction Assertive  Motor Activity Slow  Appearance/Hygiene Disheveled  Behavior Characteristics Cooperative  Mood Depressed;Anxious  Thought Process  Coherency WDL  Content WDL  Delusions None reported or observed  Perception WDL  Hallucination None reported or observed  Judgment Impaired  Confusion None  Danger to Self  Current suicidal ideation? Passive  Self-Injurious Behavior Some self-injurious ideation observed or expressed.  No lethal plan expressed   Agreement Not to Harm Self Yes  Description of Agreement verbal  Danger to Others  Danger to Others None reported or observed

## 2023-10-27 NOTE — Social Work (Signed)
 Resources:   CSW spoke with patient regarding current concerns regarding housing and financial stress and not having electricity in the home.   CSW provided Low income energy assistance to pt (LIEAP) to include the city of [place] financial hardship utility bill assistance information and application.  Steffanie Dunn [facility] 10/27/23

## 2023-10-27 NOTE — [facility] Group Notes (Signed)
 [facility] Group Notes:  (Nursing/MHT/Case Management/Adjunct)  Date:  10/27/2023  Time:  2000  Type of Therapy:   Wrap up group  Participation Level:  Active  Participation Quality:  Appropriate, Attentive, Sharing, and Supportive  Affect:  Depressed  Cognitive:  Alert  Insight:  Improving  Engagement in Group:  Engaged  Modes of Intervention:  Clarification, Education, and Support  Summary of Progress/Problems: Positive thinking and positive change were discussed.   Alexa Guerra 10/27/2023, 8:33 PM

## 2023-10-27 NOTE — [facility] Suicide Risk Assessment (Signed)
 [facility] INPATIENT:  Family/Significant Other Suicide Prevention Education  Suicide Prevention Education:  Patient Refusal for Family/Significant Other Suicide Prevention Education: The patient Alexa Guerra has refused to provide written consent for family/significant other to be provided Family/Significant Other Suicide Prevention Education during admission and/or prior to discharge.  Physician notified.  Suicide Prevention Education was reviewed thoroughly with patient, including risk factors, warning signs, and what to do.  Mobile Crisis services were described and that telephone number pointed out, with encouragement to patient to put this number in personal cell phone.  Brochure was provided to patient to share with natural supports.  Patient acknowledged the ways in which they are at risk, and how working through each of their issues can gradually start to reduce their risk factors.  Patient was encouraged to think of the information in the context of people in their own lives.  Patient denied having access to firearms  Patient verbalized understanding of information provided.  Patient endorsed a desire to live.     Steffanie Dunn LCSWA 10/27/2023, 2:03 PM
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
## 2023-10-27 NOTE — Plan of Care (Signed)

## 2023-10-27 NOTE — H&P (Signed)
 Psychiatric Admission Assessment Adult  Guerra Identification: Alexa Guerra  MRN:  [id number]  Date of Evaluation:  10/27/23  Chief Complaint:  Substance induced mood disorder (HCC) [F19.94]   Principal Diagnosis: Substance induced mood disorder (HCC)  Diagnosis:  Principal Problem:   Substance induced mood disorder (HCC) Active Problems:   Suicidal ideation   Severe opioid use disorder (HCC)   Opioid withdrawal (HCC)    Chief Complaint: "I am withdrawing from heroin and fentanyl"   History of Present Illness: Alexa Guerra is a 33 y.o. 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  Alexa Guerra presented to [facility] for Substance induced mood disorder (HCC).  Alexa Guerra reports a aborted suicide attempt by cutting her wrist in which her significant other reportedly intervened.  Alexa Guerra reports increasing depression in Alexa context of heroin and fentanyl abuse.  Of note, UDS was negative for opioids.  I ordered another urine drug screen specifically for opioids.  Per nurse practitioner Candis Shine, Alexa Guerra, a 33 year old woman, presented with acute suicidal ideation and is currently undergoing detoxification from heroin and fentanyl. Alexa Guerra reported using approximately a gram and half gram of these substances daily, by smoking. Despite her claims of recent use, toxicology results showed no opioids in her system, which raised questions about Alexa accuracy  of her self-reported substance use. UDS was only positive for marijuana. Alexa Guerra expressed confusion and insisted on Alexa truthfulness of her statements regarding her detoxification process.   Suicidal thoughts have been persistent, with a recent attempt to cut her wrists thwarted by her boyfriend. Alexa Guerra expressed feelings of depression, hopelessness, and worthlessness, and acknowledged having a plan to harm herself if released from care. Her mental health history does not include previous admissions to psychiatric facilities or rehabilitation centers, although Alexa Guerra reports that Alexa Guerra has attempted to enter rehab multiple times, facing barriers such as high blood pressure.   Alexa Guerra denied experiencing auditory or visual hallucinations. Alexa Guerra reported occasional use of marijuana, specifically one to two blunts, to aid sleep, but not on a nightly basis. Cigarette smoking, a pack a day was confirmed, while alcohol use was denied.   Family history revealed an uncle who suffered from depression and committed suicide in Alexa early 2000s. Alexa Guerra lives with her boyfriend, who also uses heroin, in a precarious housing situation. They face imminent eviction due to financial difficulties, compounded by Alexa loss of their car and jobs. Currently, they reside in a house without power.   Throughout Alexa evaluation, Alexa Guerra was cooperative but appeared distressed and anxious about her circumstances. It was recommended that Alexa Guerra receive further evaluation and support to address her suicidal ideation and substance use issues. Alexa Guerra's boyfriend, who accompanied her to Alexa hospital and apparently checked himself into Alexa hospital was also noted to have similar issues, yet his toxicology results mirrored hers, showing no opioids in his system.  Guerra reports a history of mental illness going back to childhood.  Alexa Guerra was admitted to this hospital in 2012 with a diagnosis of depressive disorder not otherwise specified,  polysubstance abuse, and rule out mood disorder not otherwise  specified.  Alexa Guerra reports that Alexa Guerra had been seeing [facility] by telehealth, but had trouble remembering to reschedule telehealth appointments.  Alexa Guerra states that they require that Alexa Guerra call 1 month before Alexa Guerra needs to be seen again after receiving 3 months of medicine.  Alexa Guerra states that Alexa Guerra has difficulty doing this in a timely manner, largely due to opioid abuse.  Alexa Guerra reports that her longest period of sobriety recently was about 3 months after Alexa Guerra spent 22 days in jail.  Alexa Guerra reports that Alexa Guerra and her significant other have been snorting and smoking heroin and fentanyl.  Alexa Guerra denies IV drug use.  Alexa Guerra reports symptoms of major depressive disorder including depressed mood, anhedonia, decreased appetite without weight loss, insomnia, increased fatigue, feelings of hopelessness, helplessness, and worthlessness, feelings of guilt, decreased concentration, recurrent thoughts of death, and suicidal ideation.  Alexa Guerra reports symptoms of unspecified bipolar disorder including abnormally and persistently elevated expansive mood and increased energy and activity lasting less than 4 consecutive days present intermittently nearly every day.  During these episodes Alexa Guerra reported denied inflated self-esteem, denied decreased need for sleep, endorsed sometimes being more talkative than usual, being easily distracted, and engaging in risky activities.  Alexa hypomanic episodes were not severe enough to cause marked impairment in functioning or require hospitalization.  Alexa Guerra reports that when Alexa Guerra was compliant with her medication regimen from [facility] (Prozac, Latuda, hydroxyzine, and BuSpar) Alexa Guerra was doing better, but still had problems with anxiety and panic attacks.  Alexa psychiatric picture is also compounded by Alexa fact that Alexa Guerra states that Alexa Guerra was abusing heroin and fentanyl during Alexa time.  We discussed starting Lexapro, and restarting Latuda,  hydroxyzine, and BuSpar.  We also discussed rechecking urine for opioids, and checking standard intake labs for vitamin deficiencies and other comorbid medical conditions.  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  Last saw [facility] about 6 months ago.  Has not had any medication since November.  Reports abusing opioids and fentanyl since approximately 2020.  Alexa Guerra reports a previous suicide attempt by cutting her wrist.  Alexa Guerra reports a recent aborted suicide attempt cutting rest due to boyfriend's intervention and taking Alexa knife away.  Is Alexa Guerra at risk to self?  Yes Has Alexa Guerra been a risk to self in Alexa past 6 months? No Has Alexa Guerra been a risk to self within Alexa distant past? No Is Alexa Guerra a risk to others? No Has Alexa Guerra been a risk to others in Alexa past 6 months? No Has Alexa Guerra been a risk to others within Alexa distant past? No  [place] Scale:  Flowsheet Row Ad[facility] (Current) [facility] from 10/25/2023 in [facility] at [facility] ED from [facility]/04/2021 in [facility] at [facility]  C-SSRS RISK CATEGORY Moderate Risk No Risk No Risk          Prior Inpatient Therapy: Yes Prior Outpatient Therapy: Yes  Alcohol Screening:  1. How often do you have a drink containing  alcohol?: Never  2. How many drinks containing alcohol do you have on a typical day when you are drinking?: 1 or 2 3. How often do you have six or more drinks on one occasion?: Never AUDIT-C Score: 0 4. How often during Alexa last year have you found that you were not able to stop drinking once you had started?: Never 5. How often during Alexa last year have you failed to do what was normally expected from you because of drinking?: Never 6. How often during Alexa last year have you needed a first drink in Alexa morning to get yourself going after a heavy drinking session?: Never 7. How often during Alexa last year have you had a feeling of guilt of remorse after drinking?: Never 8. How often during Alexa last year have you been unable to remember what happened Alexa night before because you had been drinking?: Never 9. Have you or someone else been injured as a result of your drinking?: No 10. Has a relative or friend or a doctor or another health worker been concerned about your drinking or suggested you cut down?: No Alcohol Use Disorder Identification Test Final Score (AUDIT): 0 Alcohol Brief Interventions/Follow-up: Alcohol education/Brief advice  Substance Abuse History in Alexa last 12 months: Reports abusing opioids and fentanyl for 4 years.  Alexa Guerra reports smoking and snorting, but denies IV drug use. 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     Anxiety    Bipolar disorder (HCC)    Depression    Double aortic arch    w/ ring   Dyspareunia, female 11/13/2017   Ectopic pregnancy 08/2017   H/O psychiatric hospitalization 10/27/2023   H/O psychiatric hospitalization 12/21/2010   Hx of varicella    Hypothyroidism    Moderate episode of recurrent major  depressive disorder (HCC) 10/26/2023   Ovarian cyst    Right lower quadrant abdominal pain 10/16/2017   Right ovarian cyst 10/16/2017   Severe opioid use disorder (HCC) 10/27/2023   Suicide attempt by cutting of wrist (HCC)    Tobacco use disorder      Family Psychiatric & Medical History:  Family History  Problem Relation Age of Onset   Drug abuse Mother    Cancer Mother        cervix   Alcohol abuse Father    Hypertension Maternal Grandmother    Diabetes Maternal Grandmother    Hypertension Maternal Grandfather    Diabetes Maternal Grandfather    Hypertension Paternal Grandmother    Hypertension Paternal Grandfather      Tobacco Screening:  Social History   Tobacco Use  Smoking Status Every Day   Current packs/day: 0.25   Types: Cigarettes  Smokeless Tobacco Never      Social History:  Social History   Substance and Sexual Activity  Alcohol Use Not Currently   Comment: Occasionally      Additional Social History:  Guerra reports that Alexa Guerra graduated from high school.  Alexa Guerra states that Alexa Guerra and her significant other are currently behind on their rent and living in a house that has no power because they spent their money for Alexa electrical bill and drugs.  Alexa Guerra reports that Alexa Guerra last worked last summer at [employer] briefly.  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    Allergies:   Allergies  Allergen Reactions   Other Other (See Comments)    Pt reports a reaction "vital sign changes" to a beta blocker. Alexa Guerra does not know Alexa name of Alexa medication.   Beta Adrenergic Blockers Other (See Comments)    Vitals dropped "I was pretty much dying"     Lab Results:  Results for orders placed or performed during Alexa hospital encounter of 10/25/23 (from Alexa past 48 hours)  Comprehensive metabolic panel     Status: Abnormal   Collection Time: 10/25/23  2:26 PM  Result Value Ref Range    Sodium 140 135 - 145 mmol/L   Potassium 3.9 3.5 - 5.1 mmol/L   Chloride 105 98 - 111 mmol/L   CO2 25 22 - 32 mmol/L   Glucose, Bld 98 70 - 99 mg/dL    Comment: Glucose reference range applies only to samples taken after fasting for at least 8 hours.   BUN 7 6 - 20 mg/dL   Creatinine, Ser 4.78 0.44 - 1.00 mg/dL   Calcium 9.7 8.9 - 29.5 mg/dL   Total Protein 7.7 6.5 - 8.1 g/dL   Albumin 4.8 3.5 - 5.0 g/dL   AST 18 15 - 41 U/L   ALT 17 0 - 44 U/L   Alkaline Phosphatase 63 38 - 126 U/L   Total Bilirubin 1.3 (H) 0.0 - 1.2 mg/dL   GFR, Estimated >62 >13 mL/min    Comment: (NOTE) Calculated using Alexa CKD-EPI Creatinine Equation (2021)    Anion gap 10 5 - 15    Comment: Performed at [facility], [address] [place], [place] [address]  Ethanol     Status: None   Collection Time: 10/25/23  2:26 PM  Result Value Ref Range   Alcohol, Ethyl (B) <10 <10 mg/dL    Comment: (NOTE) Lowest detectable limit for serum alcohol is 10 mg/dL.  For medical purposes only. Performed at [facility], [address] [place], [place] [address]   Salicylate level     Status: Abnormal   Collection Time: 10/25/23  2:26 PM  Result Value Ref Range   Salicylate Lvl <7.0 (L) 7.0 - 30.0 mg/dL    Comment: Performed at [facility], [address] [place], [place] [address]  Acetaminophen level     Status: Abnormal   Collection Time: 10/25/23  2:26 PM  Result Value Ref Range   Acetaminophen (Tylenol), Serum <10 (L) 10 - 30 ug/mL    Comment: (NOTE) Therapeutic concentrations vary significantly. A range of 10-30 ug/mL  may be an effective concentration for many patients. However, some  are best treated at concentrations outside of this range. Acetaminophen concentrations >150 ug/mL at 4 hours after ingestion  and >50 ug/mL at 12 hours after ingestion are often associated with  toxic reactions.  Performed at [facility], [address] [place], [place]  [address]   cbc     Status: Abnormal   Collection Time: 10/25/23  2:26 PM  Result Value Ref Range   WBC 9.6 4.0 - 10.5 K/uL   RBC 5.54 (H) 3.87 - 5.11 MIL/uL   Hemoglobin 16.6 (H) 12.0 - 15.0 g/dL   HCT 40.1 (H) 02.7 - 25.3 %   MCV 85.0 80.0 - 100.0 fL   MCH 30.0 26.0 - 34.0 pg   MCHC 35.2 30.0 - 36.0 g/dL   RDW 66.4 40.3 - 47.4 %  Platelets 387 150 - 400 K/uL   nRBC 0.0 0.0 - 0.2 %    Comment: Performed at [facility], [address] [place], [place] [address]  Urine Drug Screen, Qualitative     Status: Abnormal   Collection Time: 10/25/23  2:26 PM  Result Value Ref Range   Tricyclic, Ur Screen NONE DETECTED NONE DETECTED   Amphetamines, Ur Screen NONE DETECTED NONE DETECTED   MDMA (Ecstasy)Ur Screen NONE DETECTED NONE DETECTED   Cocaine Metabolite,Ur [place] NONE DETECTED NONE DETECTED   Opiate, Ur Screen NONE DETECTED NONE DETECTED   Phencyclidine (PCP) Ur S NONE DETECTED NONE DETECTED   Cannabinoid 50 Ng, Ur [place] POSITIVE (A) NONE DETECTED   Barbiturates, Ur Screen NONE DETECTED NONE DETECTED   Benzodiazepine, Ur Scrn NONE DETECTED NONE DETECTED   Methadone Scn, Ur NONE DETECTED NONE DETECTED    Comment: (NOTE) Tricyclics + metabolites, urine    Cutoff 1000 ng/mL Amphetamines + metabolites, urine  Cutoff 1000 ng/mL MDMA (Ecstasy), urine              Cutoff 500 ng/mL Cocaine Metabolite, urine          Cutoff 300 ng/mL Opiate + metabolites, urine        Cutoff 300 ng/mL Phencyclidine (PCP), urine         Cutoff 25 ng/mL Cannabinoid, urine                 Cutoff 50 ng/mL Barbiturates + metabolites, urine  Cutoff 200 ng/mL Benzodiazepine, urine              Cutoff 200 ng/mL Methadone, urine                   Cutoff 300 ng/mL  Alexa urine drug screen provides only a preliminary, unconfirmed analytical test result and should not be used for non-medical purposes. Clinical consideration and professional judgment should be applied to any positive drug screen result due to  possible interfering substances. A more specific alternate chemical method must be used in order to obtain a confirmed analytical result. Gas chromatography / mass spectrometry (GC/MS) is Alexa preferred confirm atory method. Performed at [facility], [address] [place], [place] [address]      Blood Alcohol level:  Lab Results  Component Value Date   [facility] <10 10/25/2023    Metabolic Disorder Labs:  No results found for: "HGBA1C", "MPG" No results found for: "PROLACTIN"  No results found for: "CHOL", "TRIG", "HDL", "VLDL", "LDLCALC"    Current Medications: Current Facility-Administered Medications  Medication Dose Route Frequency Provider Last Rate Last Admin   alum & mag hydroxide-simeth (MAALOX/MYLANTA) 200-200-20 MG/5ML suspension 30 mL  30 mL Oral Q4H PRN Onuoha, Chinwendu V, NP       busPIRone (BUSPAR) tablet 5 mg  5 mg Oral TID Golda Acre, MD       chlorproMAZINE (THORAZINE) injection 25 mg  25 mg Intramuscular TID PRN Golda Acre, MD       dicyclomine (BENTYL) tablet 20 mg  20 mg Oral Q6H PRN Onuoha, Chinwendu V, NP   20 mg at 10/27/23 0981   haloperidol (HALDOL) tablet 5 mg  5 mg Oral TID PRN Onuoha, Chinwendu V, NP       And   diphenhydrAMINE (BENADRYL) capsule 50 mg  50 mg Oral TID PRN Onuoha, Chinwendu V, NP       haloperidol lactate (HALDOL) injection 5 mg  5 mg Intramuscular TID PRN  Onuoha, Chinwendu V, NP       And   diphenhydrAMINE (BENADRYL) injection 50 mg  50 mg Intramuscular TID PRN Onuoha, Chinwendu V, NP       And   LORazepam (ATIVAN) injection 2 mg  2 mg Intramuscular TID PRN Onuoha, Chinwendu V, NP       haloperidol lactate (HALDOL) injection 10 mg  10 mg Intramuscular TID PRN Onuoha, Chinwendu V, NP       And   diphenhydrAMINE (BENADRYL) injection 50 mg  50 mg Intramuscular TID PRN Onuoha, Chinwendu V, NP       And   LORazepam (ATIVAN) injection 2 mg  2 mg Intramuscular TID PRN Onuoha, Chinwendu V, NP       escitalopram  (LEXAPRO) tablet 10 mg  10 mg Oral QHS Golda Acre, MD       hydrOXYzine (ATARAX) tablet 25 mg  25 mg Oral TID Golda Acre, MD       loperamide (IMODIUM) capsule 2-4 mg  2-4 mg Oral PRN Onuoha, Chinwendu V, NP       lurasidone (LATUDA) tablet 20 mg  20 mg Oral Q supper Golda Acre, MD       magnesium hydroxide (MILK OF MAGNESIA) suspension 30 mL  30 mL Oral Daily PRN Onuoha, Chinwendu V, NP       methocarbamol (ROBAXIN) tablet 500 mg  500 mg Oral Q8H PRN Onuoha, Chinwendu V, NP   500 mg at 10/26/23 2142   naproxen (NAPROSYN) tablet 500 mg  500 mg Oral BID PRN Onuoha, Chinwendu V, NP   500 mg at 10/27/23 4098   nicotine (NICODERM CQ - dosed in mg/24 hours) patch 21 mg  21 mg Transdermal Daily Attiah, Nadir, MD   21 mg at 10/27/23 0802   ondansetron (ZOFRAN-ODT) disintegrating tablet 4 mg  4 mg Oral Q8H PRN Golda Acre, MD        PTA Medications: Medications Prior to Admission  Medication Sig Dispense Refill Last Dose/Taking   FLUoxetine (PROZAC) 40 MG capsule Take by mouth. (Guerra not taking: Reported on 10/26/2023)        Musculoskeletal: Strength & Muscle Tone: within normal limits Gait & Station: normal Guerra leans: N/A    Psychiatric Specialty Exam:  Presentation  General Appearance: Disheveled  Eye Contact: Good  Speech: Pressured  Speech Volume: Normal  Handedness: Right   Mood and Affect  Mood: Depressed; Anxious  Affect: Restricted; Congruent   Thought Process  Thought Processes: Linear  Descriptions of Associations: Intact  Orientation: Full (Time, Place and Person)  Thought Content: Logical  History of Schizophrenia/Schizoaffective disorder: No  Duration of Psychotic Symptoms: NA Hallucinations: Hallucinations: None  Ideas of Reference: None  Suicidal Thoughts: Suicidal Thoughts: No  Homicidal Thoughts: Homicidal Thoughts: No   Sensorium  Memory: Immediate Good; Recent Good  Judgment: Fair  Insight: Fair   Restaurant manager, fast food  Concentration: Fair  Attention Span: Good  Recall: Good  Fund of Knowledge: Good  Language: Good   Psychomotor Activity  Psychomotor Activity: Psychomotor Activity: Restlessness   Assets  Assets: Communication Skills; Desire for Improvement; Housing   Sleep  Sleep: Sleep: Poor    Physical Exam: General: Sitting comfortably. NAD. HEENT: Normocephalic, atraumatic, MMM, EMOI Lungs: no increased work of breathing noted Heart: no cyanosis Abdomen: Non distended Musculoskeletal: FROM. No obvious deformities Skin: Warm, dry, intact. No rashes noted Neuro: No obvious focal deficits.  Gait and station are normal  Review of Systems  Constitutional: Negative.   HENT: Negative.    Eyes: Negative.   Respiratory: Negative.    Cardiovascular: Negative.   Gastrointestinal: Negative.   Genitourinary: Negative.   Skin: Negative.   Neurological: Negative.   Psychiatric/Behavioral:  Positive for depression, anxiety.     Blood pressure 124/84, pulse 75, temperature 98.5 F (36.9 C), temperature source Oral, resp. rate 18, height 5\' 3"  (1.6 m), weight 79.6 kg, SpO2 99%. Body mass index is 31.07 kg/m.   Treatment Plan Summary: ASSESSMENT: HALYN FLAUGHER is an 33 y.o. 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  Alexa Guerra presented on 10/26/2023  5:34 PM for Substance induced mood disorder (HCC).  Alexa Guerra reports an  aborted suicide attempt (boyfriend took Alexa knife) by cutting her wrist.  Alexa Guerra reports increasing depression in Alexa context of fentanyl and heroin abuse.  Diagnoses / Active Problems: Guerra Active Problem List   Diagnosis Date Noted   Severe opioid use disorder (HCC) 10/27/2023   Opioid withdrawal (HCC) 10/27/2023   Suicidal ideation 10/26/2023   Substance induced mood disorder (HCC) 10/26/2023     PLAN: Safety and Monitoring:  -- Voluntary admission to inpatient psychiatric unit for safety, stabilization and treatment  -- Daily contact with Guerra to assess and evaluate symptoms and progress in treatment  -- Guerra's case to be discussed in multi-disciplinary team meeting  -- Observation Level : q15 minute checks  -- Vital signs:  q12 hours  -- Precautions: suicide, elopement, and assault  2. Psychiatric Diagnoses and Treatment:  Guerra Active Problem List   Diagnosis Date Noted   Severe opioid use disorder (HCC) 10/27/2023   Opioid withdrawal (HCC) 10/27/2023   Suicidal ideation 10/26/2023   Substance induced mood disorder (HCC) 10/26/2023     Scheduled Medications:  busPIRone  5 mg Oral TID   escitalopram  10 mg Oral QHS   hydrOXYzine  25 mg Oral TID   lurasidone  20 mg Oral Q supper   nicotine  21 mg Transdermal Daily     As Needed Medications: alum & mag hydroxide-simeth, chlorproMAZINE (THORAZINE) injection, dicyclomine, haloperidol **AND** diphenhydrAMINE, haloperidol lactate **AND** diphenhydrAMINE **AND** LORazepam, haloperidol lactate **AND** diphenhydrAMINE **AND** LORazepam, loperamide, magnesium hydroxide, methocarbamol, naproxen, ondansetron    3. Medical Issues Being Addressed:    Labs reviewed, unremarkable with Alexa exception of: UDS positive for cannabinoids, will recheck urine for opioids and check standard intake labs.   Tobacco Use Disorder  -- Nicotine replacement as above  -- Smoking cessation encouraged  4. Discharge Planning:   -- Social  work and case management to assist with discharge planning and identification of hospital follow-up needs prior to discharge  -- Estimated LOS: 5-7 days  -- Discharge Concerns: Need to establish a safety plan; Medication compliance and effectiveness  -- Discharge Goals: Return home with outpatient referrals for mental health follow-up including medication management/psychotherapy  5. Short Term Goals:  Improve ability to identify changes in lifestyle to reduce recurrence of condition, verbalize feelings, disclose and discuss suicidal ideas, demonstrate self-control, identify and develop effective coping behaviors, compliance with prescribed medications, identify triggers associated with substance  abuse/mental health issues, participate in unit milieu and in scheduled group therapies   6. Long Term Goals: Improvement in symptoms so Alexa Guerra is ready for discharge   --Alexa risks/benefits/side-effects/alternatives to Alexa medications above were discussed in detail with Alexa Guerra and time was given for questions. Alexa Guerra provided informed consent.   -- Metabolic profile and EKG monitoring obtained while on an atypical antipsychotic and listed in Alexa EHR    Total Time Spent in Direct Guerra Care:  I personally spent 60 minutes on Alexa unit in direct Guerra care. Alexa direct Guerra care time included face-to-face time with Alexa Guerra, reviewing Alexa Guerra's chart, communicating with other professionals, and coordinating care. Greater than 50% of this time was spent in counseling or coordinating care with Alexa Guerra regarding goals of hospitalization, psycho-education, and discharge planning needs.   I certify that inpatient services furnished can reasonably be expected to improve Alexa Guerra's condition.    Criss Alvine, MD 10/27/2023, 11:52 AM      Portions of this note were created using voice recognition software. Minor syntax errors, grammatical content, spelling, or punctuation  errors may have occurred unintentionally. Please notify Alexa Thereasa Parkin if Alexa meaning of any statement is unclear.

## 2023-10-27 NOTE — Group Note (Signed)
 Date:  10/27/2023 Time:  1:06 PM  Group Topic/Focus:  Goals Group:   The focus of this group is to help patients establish daily goals to achieve during treatment and discuss how the patient can incorporate goal setting into their daily lives to aide in recovery. Orientation:   The focus of this group is to educate the patient on the purpose and policies of crisis stabilization and provide a format to answer questions about their admission.  The group details unit policies and expectations of patients while admitted.    Participation Level:  Did Not Attend  Participation Quality:   n/a  Affect:   n/a  Cognitive:   n/a  Insight: None  Engagement in Group:   n/a  Modes of Intervention:   n/a  Additional Comments:   Did not attend  Kamora Vossler M Camren Lipsett 10/27/2023, 1:06 PM

## 2023-10-27 NOTE — Progress Notes (Signed)
   10/26/23 2200  Psych Admission Type (Psych Patients Only)  Admission Status Voluntary  Psychosocial Assessment  Patient Complaints Depression (Depression 4/10)  Eye Contact Brief  Facial Expression Anxious  Affect Anxious;Depressed  Speech Logical/coherent  Interaction Assertive  Motor Activity Slow  Appearance/Hygiene Disheveled  Behavior Characteristics Cooperative;Anxious  Mood Depressed;Anxious  Thought Process  Coherency WDL  Content WDL  Delusions None reported or observed  Perception WDL  Hallucination None reported or observed  Judgment Impaired  Confusion None  Danger to Self  Current suicidal ideation? Denies  Agreement Not to Harm Self Yes  Description of Agreement Verbal  Danger to Others  Danger to Others None reported or observed
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
## 2023-10-28 ENCOUNTER — Encounter (HOSPITAL_COMMUNITY): Payer: Self-pay | Admitting: Psychiatry

## 2023-10-28 DIAGNOSIS — H9190 Unspecified hearing loss, unspecified ear: Secondary | ICD-10-CM

## 2023-10-28 DIAGNOSIS — E559 Vitamin D deficiency, unspecified: Secondary | ICD-10-CM

## 2023-10-28 DIAGNOSIS — E538 Deficiency of other specified B group vitamins: Secondary | ICD-10-CM

## 2023-10-28 DIAGNOSIS — F1994 Other psychoactive substance use, unspecified with psychoactive substance-induced mood disorder: Secondary | ICD-10-CM

## 2023-10-28 HISTORY — DX: Deficiency of other specified B group vitamins: E53.8

## 2023-10-28 HISTORY — DX: Vitamin D deficiency, unspecified: E55.9

## 2023-10-28 HISTORY — DX: Unspecified hearing loss, unspecified ear: H91.90

## 2023-10-28 LAB — RPR: RPR Ser Ql: NONREACTIVE

## 2023-10-28 MED ORDER — CLONIDINE HCL 0.1 MG PO TABS
0.1000 mg | ORAL_TABLET | ORAL | Status: AC
  Administered 2023-10-30 – 2023-10-31 (×3): 0.1 mg via ORAL
  Filled 2023-10-28 (×4): qty 1

## 2023-10-28 MED ORDER — CLONIDINE HCL 0.1 MG PO TABS
0.1000 mg | ORAL_TABLET | Freq: Four times a day (QID) | ORAL | Status: AC
  Administered 2023-10-28 – 2023-10-29 (×5): 0.1 mg via ORAL
  Filled 2023-10-28 (×8): qty 1

## 2023-10-28 MED ORDER — NICOTINE 21 MG/24HR TD PT24
21.0000 mg | MEDICATED_PATCH | Freq: Every day | TRANSDERMAL | Status: DC
  Administered 2023-10-28 – 2023-11-01 (×5): 21 mg via TRANSDERMAL
  Filled 2023-10-28 (×8): qty 1

## 2023-10-28 MED ORDER — CLONIDINE HCL 0.1 MG PO TABS
0.1000 mg | ORAL_TABLET | Freq: Every day | ORAL | Status: DC
  Administered 2023-11-01: 0.1 mg via ORAL
  Filled 2023-10-28 (×2): qty 1

## 2023-10-28 MED ORDER — VITAMIN D (ERGOCALCIFEROL) 1.25 MG (50000 UNIT) PO CAPS
50000.0000 [IU] | ORAL_CAPSULE | ORAL | Status: DC
  Administered 2023-10-28: 50000 [IU] via ORAL
  Filled 2023-10-28: qty 1

## 2023-10-28 MED ORDER — VITAMIN D3 25 MCG PO TABS
2000.0000 [IU] | ORAL_TABLET | Freq: Two times a day (BID) | ORAL | Status: DC
  Administered 2023-10-29 – 2023-11-01 (×7): 2000 [IU] via ORAL
  Filled 2023-10-28 (×11): qty 2

## 2023-10-28 MED ORDER — VITAMIN B-12 1000 MCG PO TABS
1000.0000 ug | ORAL_TABLET | Freq: Every day | ORAL | Status: DC
  Administered 2023-10-28 – 2023-11-01 (×5): 1000 ug via ORAL
  Filled 2023-10-28 (×7): qty 1

## 2023-10-28 NOTE — [facility] Group Notes (Signed)
 [facility] Group Notes:  (Nursing/MHT/Case Management/Adjunct)  Date:  10/28/2023  Time:  2000  Type of Therapy:   Wrap up group  Participation Level:  Active  Participation Quality:  Appropriate, Attentive, Sharing, and Supportive  Affect:  Flat  Cognitive:  Alert  Insight:  Improving  Engagement in Group:  Engaged  Modes of Intervention:  Clarification, Education, and Socialization  Summary of Progress/Problems: Positive thinking and self-care were discussed.   Alexa Guerra 10/28/2023, 8:36 PM

## 2023-10-28 NOTE — Group Note (Signed)
 Date:  10/28/2023 Time:  3:39 PM  Group Topic/Focus:  Rediscovering Joy:   The focus of this group is to explore various ways to relieve stress in a positive manner.    Participation Level:  Did Not Attend   Shela Nevin 10/28/2023, 3:39 PM

## 2023-10-28 NOTE — Plan of Care (Signed)

## 2023-10-28 NOTE — Group Note (Signed)
 Date:  10/28/2023 Time:  4:31 PM  Group Topic/Focus:  Goals Group:   The focus of this group is to help patients establish daily goals to achieve during treatment and discuss how the patient can incorporate goal setting into their daily lives to aide in recovery. Orientation:   The focus of this group is to educate the patient on the purpose and policies of crisis stabilization and provide a format to answer questions about their admission.  The group details unit policies and expectations of patients while admitted.    Participation Level:  Active  Participation Quality:  Appropriate  Affect:  Appropriate  Cognitive:  Appropriate  Insight: Appropriate  Engagement in Group:  Engaged  Modes of Intervention:  Discussion and Orientation  Additional Comments:   Pt attended the Orientation and Goals group.  Edmund Hilda Namiah Dunnavant 10/28/2023, 4:31 PM

## 2023-10-28 NOTE — Group Note (Signed)
 [facility] LCSW Group Therapy Note  Date/Time:  10/28/2023/ 10:00-11:00  Type of Therapy and Topic:  Group Therapy:  Mindful and Mind Full   Participation Level:  Minimal   Description of Group In this group patient will be asked to explore mindful and Mind full. Patient will be guided to discuss their thoughts, feelings, and behaviors related to mindful and mind full in themselves and others. Patient will process together how mindful and min full relate to how we form relationships with peers, family member, and self. Each patient will be challenged to identify and express feelings of being vulnerable. Patient will discuss reasons why people are mind full and identify alternative outcomes if one was mindful to self or others). This group will be process-oriented, with patients participating in explorations of their own experiences as well as giving and receiving support and challenged from other groups members.   Therapeutic Goals Patient will identify why honesty is important to relationships and how mindful overall affects the decision made Patient will identify a situation where their mind-full and process the feelings, thought process, and behaviors surrounding the situation. Patient will identify the meaning of being vulnerable, how that feels, and how that correlate to being mindful with self and others.  Summary of Patient Progress:    The patient share that she was grateful for being in a facilitate that will help her getting the information that she needs. The patient has a flat affect and did not share her thoughts.     Therapeutic Modalities Cognitive Behavioral Therapy Motivational Interviewing

## 2023-10-28 NOTE — Progress Notes (Signed)
 [facility] MD Progress Note  10/28/2023 11:00 AM Alexa Guerra  MRN:  [id number] Principal Problem: Substance induced mood disorder (HCC) Diagnosis: Principal Problem:   Substance induced mood disorder (HCC) Active Problems:   Suicidal ideation   Severe opioid use disorder (HCC)   Opioid withdrawal (HCC)   ID & Admission Information: Alexa Guerra is an 33 y.o. 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), Anxiety, B12 deficiency (10/28/2023), Bipolar disorder (HCC), Depression, Double aortic arch, Dyspareunia, female (11/13/2017), Ectopic pregnancy (08/2017), H/O psychiatric hospitalization (10/27/2023), H/O psychiatric hospitalization (12/21/2010), Hard of hearing (10/28/2023), varicella, Hypothyroidism, Moderate episode of recurrent major depressive disorder (HCC) (10/26/2023), Ovarian cyst, Panic attacks, Right lower quadrant abdominal pain (10/16/2017), Right ovarian cyst (10/16/2017), Severe opioid use disorder (HCC) (10/27/2023), Suicide attempt by cutting of wrist (HCC), Tobacco use disorder, and Vitamin D deficiency (10/28/2023).  She presented on 10/26/2023  5:34 PM for Substance induced mood disorder (HCC).   Chief Complaint: "I am having some cramping from withdrawal"  Subjective:   Case was discussed in the multidisciplinary team. MAR was reviewed and patient was compliant with medications.  No acute events occurred overnight.  The patient reports no intolerance of medications started yesterday.  She feels like her anxiety is relatively well-controlled with BuSpar and hydroxyzine.  She continues to feel subjectively depressed and continues to endorse passive suicidal ideation without a plan.  Her primary complaint is opioid withdrawal symptoms.  She states that she had some naproxen last night but it seemed to make the cramping worse possibly, but certainly not better.  We discussed adding clonidine to her regimen to help with withdrawal  symptoms.  Labs were remarkable for vitamin D deficiency and B12 deficiency.  Will plan on replacing.  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     Anxiety    B12 deficiency 10/28/2023   Bipolar disorder (HCC)    Depression    Double aortic arch    w/ ring   Dyspareunia, female 11/13/2017   Ectopic pregnancy 08/2017   H/O psychiatric hospitalization 10/27/2023   H/O psychiatric hospitalization 12/21/2010   Hard of hearing 10/28/2023   Hx of varicella    Hypothyroidism    Moderate episode of recurrent major depressive disorder (HCC) 10/26/2023   Ovarian cyst    Panic attacks    Right lower quadrant abdominal pain 10/16/2017   Right ovarian cyst 10/16/2017   Severe opioid use disorder (HCC) 10/27/2023   Suicide attempt by cutting of wrist (HCC)    Tobacco use disorder    Vitamin D deficiency 10/28/2023    Past Surgical History:  Procedure Laterality Date   WISDOM TOOTH EXTRACTION  2009   all four    Family History(Medical and Psychiatric):  Family History  Problem Relation Age of Onset   Drug abuse Mother    Cancer Mother        cervix   Alcohol abuse Father    Hypertension Maternal Grandmother    Diabetes Maternal Grandmother    Hypertension Maternal Grandfather    Diabetes Maternal Grandfather    Hypertension Paternal Grandmother    Hypertension Paternal Grandfather        Social History:  Social History   Substance and Sexual Activity  Alcohol Use Not Currently   Comment: Occasionally     Social History  Substance and Sexual Activity  Drug Use Yes   Types: Marijuana, Fentanyl, Heroin   Comment: heroin and fentanyl    Social History   Socioeconomic History   Marital status: Significant Other    Spouse name: Not on file   Number of children: Not on file   Years of education: Not on  file   Highest education level: Not on file  Occupational History   Not on file  Tobacco Use   Smoking status: Every Day    Current packs/day: 0.25    Types: Cigarettes   Smokeless tobacco: Never  Vaping Use   Vaping status: Never Used  Substance and Sexual Activity   Alcohol use: Not Currently    Comment: Occasionally   Drug use: Yes    Types: Marijuana, Fentanyl, Heroin    Comment: heroin and fentanyl   Sexual activity: Yes    Birth control/protection: None  Other Topics Concern   Not on file  Social History Narrative   Not on file   Social Drivers of Health   Financial Resource Strain: Not on file  Food Insecurity: No Food Insecurity (10/26/2023)   Hunger Vital Sign    Worried About Running Out of Food in the Last Year: Never true    Ran Out of Food in the Last Year: Never true  Transportation Needs: No Transportation Needs (10/26/2023)   PRAPARE - Administrator, Civil Service (Medical): No    Lack of Transportation (Non-Medical): No  Physical Activity: Not on file  Stress: Not on file  Social Connections: Not on file        Current Medications: Current Facility-Administered Medications  Medication Dose Route Frequency Provider Last Rate Last Admin   alum & mag hydroxide-simeth (MAALOX/MYLANTA) 200-200-20 MG/5ML suspension 30 mL  30 mL Oral Q4H PRN Onuoha, Chinwendu V, NP       busPIRone (BUSPAR) tablet 5 mg  5 mg Oral TID Golda Acre, MD   5 mg at 10/28/23 1610   chlorproMAZINE (THORAZINE) injection 25 mg  25 mg Intramuscular TID PRN Golda Acre, MD       cloNIDine (CATAPRES) tablet 0.1 mg  0.1 mg Oral QID Golda Acre, MD       Followed by   Melene Muller ON 10/30/2023] cloNIDine (CATAPRES) tablet 0.1 mg  0.1 mg Oral BH-qamhs Golda Acre, MD       Followed by   Melene Muller ON 11/02/2023] cloNIDine (CATAPRES) tablet 0.1 mg  0.1 mg Oral QAC breakfast Golda Acre, MD       cyanocobalamin (VITAMIN B12) tablet 1,000 mcg  1,000 mcg Oral Daily  Golda Acre, MD       dicyclomine (BENTYL) tablet 20 mg  20 mg Oral Q6H PRN Onuoha, Chinwendu V, NP   20 mg at 10/27/23 9604   haloperidol (HALDOL) tablet 5 mg  5 mg Oral TID PRN Onuoha, Chinwendu V, NP       And   diphenhydrAMINE (BENADRYL) capsule 50 mg  50 mg Oral TID PRN Onuoha, Chinwendu V, NP       haloperidol lactate (HALDOL) injection 5 mg  5 mg Intramuscular TID PRN Onuoha, Chinwendu V, NP       And   diphenhydrAMINE (BENADRYL) injection 50 mg  50 mg Intramuscular TID PRN Onuoha, Chinwendu V, NP       And   LORazepam (ATIVAN) injection 2 mg  2 mg Intramuscular TID PRN Onuoha, Chinwendu V, NP  haloperidol lactate (HALDOL) injection 10 mg  10 mg Intramuscular TID PRN Onuoha, Chinwendu V, NP       And   diphenhydrAMINE (BENADRYL) injection 50 mg  50 mg Intramuscular TID PRN Onuoha, Chinwendu V, NP       And   LORazepam (ATIVAN) injection 2 mg  2 mg Intramuscular TID PRN Onuoha, Chinwendu V, NP       escitalopram (LEXAPRO) tablet 10 mg  10 mg Oral QHS Golda Acre, MD   10 mg at 10/27/23 2103   hydrOXYzine (ATARAX) tablet 25 mg  25 mg Oral TID Golda Acre, MD   25 mg at 10/28/23 0757   loperamide (IMODIUM) capsule 2-4 mg  2-4 mg Oral PRN Onuoha, Chinwendu V, NP       lurasidone (LATUDA) tablet 20 mg  20 mg Oral Q supper Golda Acre, MD   20 mg at 10/27/23 1712   magnesium hydroxide (MILK OF MAGNESIA) suspension 30 mL  30 mL Oral Daily PRN Onuoha, Chinwendu V, NP       methocarbamol (ROBAXIN) tablet 500 mg  500 mg Oral Q8H PRN Onuoha, Chinwendu V, NP   500 mg at 10/27/23 2103   naproxen (NAPROSYN) tablet 500 mg  500 mg Oral BID PRN Onuoha, Chinwendu V, NP   500 mg at 10/28/23 0756   nicotine (NICODERM CQ - dosed in mg/24 hours) patch 21 mg  21 mg Transdermal Daily Golda Acre, MD       nicotine polacrilex (NICORETTE) gum 2 mg  2 mg Oral PRN Abbott Pao, Nadir, MD   2 mg at 10/28/23 0757   ondansetron (ZOFRAN-ODT) disintegrating tablet 4 mg  4 mg Oral Q8H PRN Golda Acre, MD   4 mg at 10/27/23 1805   traZODone (DESYREL) tablet 50 mg  50 mg Oral QHS Golda Acre, MD   50 mg at 10/27/23 2103   Vitamin D (Ergocalciferol) (DRISDOL) 1.25 MG (50000 UNIT) capsule 50,000 Units  50,000 Units Oral Q7 days Golda Acre, MD       [START ON 10/29/2023] vitamin D3 (CHOLECALCIFEROL) tablet 2,000 Units  2,000 Units Oral BID Golda Acre, MD        Lab Results:  Results for orders placed or performed during the hospital encounter of 10/26/23 (from the past 48 hours)  Folate     Status: None   Collection Time: 10/27/23  6:35 PM  Result Value Ref Range   Folate 10.0 >5.9 ng/mL    Comment: Performed at [facility], [address], [place], [place] [address]  Hemoglobin A1c     Status: None   Collection Time: 10/27/23  6:35 PM  Result Value Ref Range   Hgb A1c MFr Bld 5.0 4.8 - 5.6 %    Comment: (NOTE) Pre diabetes:          5.7%-6.4%  Diabetes:              >6.4%  Glycemic control for   <7.0% adults with diabetes    Mean Plasma Glucose 96.8 mg/dL    Comment: Performed at [facility], [address] [place], [place] [address]  Lipid panel     Status: Abnormal   Collection Time: 10/27/23  6:35 PM  Result Value Ref Range   Cholesterol 136 0 - 200 mg/dL   Triglycerides 96 <829 mg/dL   HDL 40 (L) >56 mg/dL   Total CHOL/HDL Ratio 3.4 RATIO   VLDL  19 0 - 40 mg/dL   LDL Cholesterol 77 0 - 99 mg/dL    Comment:        Total Cholesterol/HDL:CHD Risk Coronary Heart Disease Risk Table                     Men   Women  1/2 Average Risk   3.4   3.3  Average Risk       5.0   4.4  2 X Average Risk   9.6   7.1  3 X Average Risk  23.4   11.0        Use the calculated Patient Ratio above and the CHD Risk Table to determine the patient's CHD Risk.        ATP III CLASSIFICATION (LDL):  <100     mg/dL   Optimal  161-096  mg/dL   Near or Above                    Optimal  130-159  mg/dL   Borderline  045-409  mg/dL   High  >811      mg/dL   Very High Performed at [facility], [address] [place], [place] [address]   TSH     Status: None   Collection Time: 10/27/23  6:35 PM  Result Value Ref Range   TSH 0.930 0.350 - 4.500 uIU/mL    Comment: Performed by a 3rd Generation assay with a functional sensitivity of <=0.01 uIU/mL. Performed at [facility], [address], [place], [place] [address]   Vitamin B12     Status: Abnormal   Collection Time: 10/27/23  6:35 PM  Result Value Ref Range   Vitamin B-12 164 (L) 180 - 914 pg/mL    Comment: (NOTE) This assay is not validated for testing neonatal or myeloproliferative syndrome specimens for Vitamin B12 levels. Performed at [facility], [address] [place], [place] [address]   VITAMIN D 25 Hydroxy (Vit-D Deficiency, Fractures)     Status: Abnormal   Collection Time: 10/27/23  6:35 PM  Result Value Ref Range   Vit D, 25-Hydroxy 20.68 (L) 30 - 100 ng/mL    Comment: (NOTE) Vitamin D deficiency has been defined by the Institute of Medicine  and an Endocrine Society practice guideline as a level of serum 25-OH  vitamin D less than 20 ng/mL (1,2). The Endocrine Society went on to  further define vitamin D insufficiency as a level between 21 and 29  ng/mL (2).  1. IOM (Institute of Medicine). 2010. Dietary reference intakes for  calcium and D. [place]: [employer]. 2. Holick MF, Binkley [place], Bischoff-Ferrari HA, et al. Evaluation,  treatment, and prevention of vitamin D deficiency: an Endocrine  Society clinical practice guideline, JCEM. 2011 Jul; 96(7): 1911-30.  Performed at [facility], [address], [place], [place] [address]   T4, free     Status: None   Collection Time: 10/27/23  6:35 PM  Result Value Ref Range   Free T4 1.01 0.61 - 1.12 ng/dL    Comment: (NOTE) Biotin ingestion may interfere with free T4 tests. If the results are inconsistent with the TSH  level, previous test results, or the clinical presentation, then consider biotin interference. If needed, order repeat testing after stopping biotin. Performed at [facility], [address], [place], [place] [address]   HIV Antibody (routine testing w rflx)  Status: None   Collection Time: 10/27/23  6:35 PM  Result Value Ref Range   HIV Screen 4th Generation wRfx Non Reactive Non Reactive    Comment: Performed at [facility], [address] [place], [place] [address]    Blood Alcohol level:  Lab Results  Component Value Date   ETH <10 10/25/2023    Metabolic Disorder Labs: Lab Results  Component Value Date   HGBA1C 5.0 10/27/2023   MPG 96.8 10/27/2023   No results found for: "PROLACTIN" Lab Results  Component Value Date   CHOL 136 10/27/2023   TRIG 96 10/27/2023   HDL 40 (L) 10/27/2023   CHOLHDL 3.4 10/27/2023   VLDL 19 10/27/2023   LDLCALC 77 10/27/2023    Physical Findings: AIMS:  , ,  ,  ,    CIWA:    COWS:  COWS Total Score: 2  Psychiatric Specialty Exam:  Presentation  General Appearance: Fairly Groomed  Eye Contact: Good  Speech: Other (comment) (Increased rate)  Speech Volume: Normal  Handedness: Right   Mood and Affect  Mood: Depressed; Anxious  Affect: Restricted; Congruent   Thought Process  Thought Processes: Linear  Descriptions of Associations: Intact  Orientation: Full (Time, Place and Person)  Thought Content: Logical  History of Schizophrenia/Schizoaffective disorder: No  Duration of Psychotic Symptoms: NA Hallucinations: Hallucinations: None  Ideas of Reference: None  Suicidal Thoughts: Suicidal Thoughts: No  Homicidal Thoughts: Homicidal Thoughts: No   Sensorium  Memory: Immediate Good; Recent Good  Judgment: Poor  Insight: Fair   Art therapist  Concentration: Fair  Attention Span: Good  Recall: Good  Fund of Knowledge: Good  Language: Good   Psychomotor Activity   Psychomotor Activity: Psychomotor Activity: Restlessness   Assets  Assets: Communication Skills; Desire for Improvement; Housing   Sleep  Sleep: Sleep: Good   Musculoskeletal: Strength & Muscle Tone: within normal limits Gait & Station: normal Patient leans: N/A   Physical Exam: General: Sitting comfortably. NAD. HEENT: Normocephalic, atraumatic, MMM, EMOI Lungs: no increased work of breathing noted Heart: no cyanosis Abdomen: Non distended Musculoskeletal: FROM. No obvious deformities Skin: Warm, dry, intact. No rashes noted Neuro: No obvious focal deficits.  Gait and station are normal  Review of Systems  Constitutional: Negative.   HENT: Negative.    Eyes: Negative.   Respiratory: Negative.    Cardiovascular: Negative.   Gastrointestinal: Negative.   Genitourinary: Negative.   Skin: Negative.   Neurological: Negative.   Psychiatric/Behavioral:  Positive for depression.     Blood pressure (!) 140/84, pulse 78, temperature 97.8 F (36.6 C), temperature source Oral, resp. rate 20, height 5\' 3"  (1.6 m), weight 79.6 kg, SpO2 100%. Body mass index is 31.07 kg/m.  ASSESSMENT: Alexa Guerra is an 33 y.o. 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), Anxiety, B12 deficiency (10/28/2023), Bipolar disorder (HCC), Depression, Double aortic arch, Dyspareunia, female (11/13/2017), Ectopic pregnancy (08/2017), H/O psychiatric hospitalization (10/27/2023), H/O psychiatric hospitalization (12/21/2010), Hard of hearing (10/28/2023), varicella, Hypothyroidism, Moderate episode of recurrent major depressive disorder (HCC) (10/26/2023), Ovarian cyst, Panic attacks, Right lower quadrant abdominal pain (10/16/2017), Right ovarian cyst (10/16/2017), Severe opioid use disorder (HCC) (10/27/2023), Suicide attempt by cutting of wrist (HCC), Tobacco use disorder, and Vitamin D deficiency (10/28/2023).  She presented on 10/26/2023  5:34 PM for Substance  induced mood disorder (HCC).  She reportedly had an a aborted suicide attempt in which the significant other had to take a knife from  her before she cut her wrist.  Diagnoses / Active Problems: Patient Active Problem List   Diagnosis Date Noted   Severe opioid use disorder (HCC) 10/27/2023   Opioid withdrawal (HCC) 10/27/2023   Suicidal ideation 10/26/2023   Substance induced mood disorder (HCC) 10/26/2023      PLAN: Safety and Monitoring:  -- Voluntary admission to inpatient psychiatric unit for safety, stabilization and treatment  -- Daily contact with patient to assess and evaluate symptoms and progress in treatment  -- Patient's case to be discussed in multi-disciplinary team meeting  -- Observation Level : q15 minute checks  -- Vital signs:  q12 hours  -- Precautions: suicide, elopement, and assault  2. Psychiatric Diagnoses and Treatment:  Patient Active Problem List   Diagnosis Date Noted   Severe opioid use disorder (HCC) 10/27/2023   Opioid withdrawal (HCC) 10/27/2023   Suicidal ideation 10/26/2023   Substance induced mood disorder (HCC) 10/26/2023     Scheduled Medications:  busPIRone  5 mg Oral TID   cloNIDine  0.1 mg Oral QID   Followed by   Melene Muller ON 10/30/2023] cloNIDine  0.1 mg Oral BH-qamhs   Followed by   Melene Muller ON 11/02/2023] cloNIDine  0.1 mg Oral QAC breakfast   vitamin B-12  1,000 mcg Oral Daily   escitalopram  10 mg Oral QHS   hydrOXYzine  25 mg Oral TID   lurasidone  20 mg Oral Q supper   nicotine  21 mg Transdermal Daily   traZODone  50 mg Oral QHS   Vitamin D (Ergocalciferol)  50,000 Units Oral Q7 days   [START ON 10/29/2023] cholecalciferol  2,000 Units Oral BID    As Needed Medications: alum & mag hydroxide-simeth, chlorproMAZINE (THORAZINE) injection, dicyclomine, haloperidol **AND** diphenhydrAMINE, haloperidol lactate **AND** diphenhydrAMINE **AND** LORazepam, haloperidol lactate **AND** diphenhydrAMINE **AND** LORazepam, loperamide, magnesium  hydroxide, methocarbamol, naproxen, nicotine polacrilex, ondansetron    3. Medical Issues Being Addressed:   -- B12 deficiency and vitamin D deficiency as above  Labs reviewed, unremarkable with the exception of: Vitamin D and B12.  Urine opioids lab is pending.   Tobacco Use Disorder  -- Nicotine replacement as above  -- Smoking cessation encouraged  4. Discharge Planning:   -- Social work and case management to assist with discharge planning and identification of hospital follow-up needs prior to discharge  -- Estimated LOS: Likely discharge in 3 to 5 days  -- Discharge Concerns: Need to establish a safety plan; Medication compliance and effectiveness  -- Discharge Goals: Return home with outpatient referrals for mental health follow-up including medication management/psychotherapy  5. Short Term Goals:  Improve ability to identify changes in lifestyle to reduce recurrence of condition, verbalize feelings, disclose and discuss suicidal ideas, demonstrate self-control, identify and develop effective coping behaviors, compliance with prescribed medications, identify triggers associated with substance abuse/mental health issues, participate in unit milieu and in scheduled group therapies   6. Long Term Goals: Improvement in symptoms so the patient is ready for discharge   --The risks/benefits/side-effects/alternatives to the medications above were discussed in detail with the patient and time was given for questions. The patient provided informed consent.   -- Metabolic profile and EKG monitoring obtained while on an atypical antipsychotic and listed in the EHR    Total Time Spent in Direct Patient Care:  I personally spent 35 minutes on the unit in direct patient care. The direct patient care time included face-to-face time with the patient, reviewing the patient's chart, communicating with other professionals, and  coordinating care. Greater than 50% of this time was spent in counseling  or coordinating care with the patient regarding goals of hospitalization, psycho-education, and discharge planning needs.      Criss Alvine, MD Psychiatrist  10/28/2023, 11:00 AM   I certify that inpatient services furnished can reasonably be expected to improve the patient's condition.    Portions of this note were created using voice recognition software. Minor syntax errors, grammatical content, spelling, or punctuation errors may have occurred unintentionally. Please notify the Thereasa Parkin if the meaning of any statement is unclear.

## 2023-10-28 NOTE — Progress Notes (Signed)
 D: Patient is alert, oriented, and cooperative. Denies SI, HI, AVH, and verbally contracts for safety. Patient reports she slept fair last night with sleeping medication. Patient reports her appetite as good, energy level as low, and concentration as poor. Patient rates her depression 5/10, hopelessness 5/10, and anxiety 2/10. Patient reports generalized pain that naproxen isn't helping. Patient reports constipation.   A: Scheduled medications administered per MD order. PRN naproxen, nicotine gum, and milk of magnesia administered. Support provided. Patient educated on safety on the unit and medications. Routine safety checks every 15 minutes. Patient stated understanding to tell nurse about any new physical symptoms. Patient understands to tell staff of any needs.     R: No adverse drug reactions noted. Patient remains safe at this time and will continue to monitor.    10/28/23 1100  Psych Admission Type (Psych Patients Only)  Admission Status Voluntary  Psychosocial Assessment  Patient Complaints Anxiety;Depression  Eye Contact Fair  Facial Expression Anxious  Affect Anxious;Depressed  Speech Logical/coherent  Interaction Assertive  Motor Activity Slow  Appearance/Hygiene Disheveled  Behavior Characteristics Cooperative  Mood Depressed;Anxious  Thought Process  Coherency WDL  Content WDL  Delusions None reported or observed  Perception WDL  Hallucination None reported or observed  Judgment Impaired  Confusion None  Danger to Self  Current suicidal ideation? Denies  Self-Injurious Behavior No self-injurious ideation or behavior indicators observed or expressed   Agreement Not to Harm Self Yes  Description of Agreement verbal  Danger to Others  Danger to Others None reported or observed

## 2023-10-28 NOTE — Progress Notes (Signed)
 D) Pt received calm, visible, participating in milieu, and in no acute distress. Pt A & O x4. Pt denies SI, HI, A/ V H, depression, anxiety and pain at this time. A) Pt encouraged to drink fluids. Pt encouraged to come to staff with needs. Pt encouraged to attend and participate in groups. Pt encouraged to set reachable goals.  R) Pt remained safe on unit, in no acute distress, will continue to assess.     10/28/23 2200  Psych Admission Type (Psych Patients Only)  Admission Status Voluntary  Psychosocial Assessment  Patient Complaints Anxiety  Eye Contact Fair  Facial Expression Anxious  Affect Anxious  Speech Logical/coherent  Interaction Assertive  Motor Activity Slow  Appearance/Hygiene Disheveled  Behavior Characteristics Cooperative  Mood Anxious  Thought Process  Coherency WDL  Content WDL  Delusions None reported or observed  Perception WDL  Hallucination None reported or observed  Judgment Impaired  Confusion None  Danger to Self  Current suicidal ideation? Denies  Self-Injurious Behavior No self-injurious ideation or behavior indicators observed or expressed   Agreement Not to Harm Self Yes  Description of Agreement verbal  Danger to Others  Danger to Others None reported or observed

## 2023-10-28 NOTE — Progress Notes (Signed)
   10/27/23 2300  Psych Admission Type (Psych Patients Only)  Admission Status Voluntary  Psychosocial Assessment  Patient Complaints None  Eye Contact Brief  Facial Expression Anxious  Affect Anxious;Depressed  Speech Logical/coherent  Interaction Assertive  Motor Activity Slow  Appearance/Hygiene Disheveled  Behavior Characteristics Cooperative  Mood Depressed;Anxious  Thought Process  Coherency WDL  Content WDL  Delusions None reported or observed  Perception WDL  Hallucination None reported or observed  Judgment Impaired  Confusion None  Danger to Self  Current suicidal ideation? Denies  Self-Injurious Behavior No self-injurious ideation or behavior indicators observed or expressed   Agreement Not to Harm Self Yes  Description of Agreement Verbal  Danger to Others  Danger to Others None reported or observed

## 2023-10-29 ENCOUNTER — Encounter (HOSPITAL_COMMUNITY): Payer: Self-pay

## 2023-10-29 DIAGNOSIS — F1994 Other psychoactive substance use, unspecified with psychoactive substance-induced mood disorder: Secondary | ICD-10-CM | POA: Diagnosis not present

## 2023-10-29 LAB — T3, FREE: T3, Free: 3 pg/mL (ref 2.0–4.4)

## 2023-10-29 NOTE — Group Note (Signed)
 Recreation Therapy Group Note   Group Topic:Leisure Education  Group Date: 10/29/2023 Start Time: 0930 End Time: 1000 Facilitators: Kinnley Paulson-McCall, LRT,CTRS Location: [address] Dayroom   Group Topic: Leisure Education  Goal Area(s) Addresses:  Patient will identify positive leisure activities for use post discharge. Patient will identify at least one positive benefit of participation in leisure activities.   Intervention: Innovation, Group Presentation   Activity: LRT and patients set room in a way that would be effective in completing activity. Patients were seated in a circle and given a beach ball. Patients were instructed they were to hit the ball back and forth to each other as if playing volleyball. LRT would time the patients as they played the game. The ball could bounce or roll but not come to a complete stop. It the ball came to a stop at any time, the timer would reset and start over. Patients were to remain seated throughout activity but could get up if the ball escaped the circle at any point.  Education:  Leisure Scientist, physiological, Special educational needs teacher, Teamwork, Discharge Planning  Education Outcome: Acknowledges education/In group clarification offered/Needs additional education.    Affect/Mood: Appropriate   Participation Level: Engaged   Participation Quality: Independent   Behavior: Appropriate   Speech/Thought Process: Focused   Insight: Good   Judgement: Good   Modes of Intervention: Cooperative Play   Patient Response to Interventions:  Engaged   Education Outcome:  In group clarification offered    Clinical Observations/Individualized Feedback: Pt was engaged and bright during group session. Pt worked well with peers in keeping the ball moving. Pt shared laughter and was social with peers as well.     Plan: Continue to engage patient in RT group sessions 2-3x/week.   Cyndie Woodbeck-McCall, LRT,CTRS 10/29/2023 12:17 PM

## 2023-10-29 NOTE — Progress Notes (Signed)
 D: Patient is alert, oriented, pleasant, and cooperative. Denies SI, HI, AVH, and verbally contracts for safety. Patient reports she slept fair last night with sleeping medication. Patient reports her appetite as fair, energy level as low, and concentration as poor. Patient rates her depression 5/10, hopelessness 5/10, and anxiety 5/10. Patient reports generalized pain. Patient reports nausea.    A: Scheduled medications administered per MD order. PRN robaxin, naproxen, nicotine gum, and zofran administered. Support provided. Patient educated on safety on the unit and medications. Routine safety checks every 15 minutes. Patient stated understanding to tell nurse about any new physical symptoms. Patient understands to tell staff of any needs.     R: No adverse drug reactions noted. Patient verbally contracts for safety. Patient remains safe at this time and will continue to monitor.    10/29/23 1100  Psych Admission Type (Psych Patients Only)  Admission Status Voluntary  Psychosocial Assessment  Patient Complaints Anxiety  Eye Contact Fair  Facial Expression Anxious  Affect Appropriate to circumstance  Speech Logical/coherent  Interaction Assertive  Motor Activity Other (Comment) (WNL)  Appearance/Hygiene Unremarkable  Behavior Characteristics Cooperative  Mood Anxious;Pleasant  Thought Process  Coherency WDL  Content WDL  Delusions None reported or observed  Perception WDL  Hallucination None reported or observed  Judgment Impaired  Confusion None  Danger to Self  Current suicidal ideation? Denies  Self-Injurious Behavior No self-injurious ideation or behavior indicators observed or expressed   Agreement Not to Harm Self Yes  Description of Agreement verbal  Danger to Others  Danger to Others None reported or observed

## 2023-10-29 NOTE — Progress Notes (Signed)
   10/29/23 2200  Psych Admission Type (Psych Patients Only)  Admission Status Voluntary  Psychosocial Assessment  Patient Complaints Anxiety  Eye Contact Fair  Facial Expression Anxious  Affect Anxious  Speech Logical/coherent  Interaction Assertive  Motor Activity Fidgety  Appearance/Hygiene Unremarkable  Behavior Characteristics Cooperative  Mood Anxious;Pleasant  Thought Process  Coherency WDL  Content WDL  Delusions None reported or observed  Perception WDL  Hallucination None reported or observed  Judgment Impaired  Confusion None  Danger to Self  Current suicidal ideation? Denies  Self-Injurious Behavior No self-injurious ideation or behavior indicators observed or expressed   Agreement Not to Harm Self Yes  Description of Agreement verbal  Danger to Others  Danger to Others None reported or observed
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
## 2023-10-29 NOTE — Group Note (Signed)
 Occupational Therapy Group Note  Group Topic:Communication  Group Date: 10/29/2023 Start Time: 1422 End Time: 1500 Facilitators: Ted Mcalpine, OT   Group Description: Group encouraged increased engagement and participation through discussion focused on communication styles. Patients were educated on the different styles of communication including passive, aggressive, assertive, and passive-aggressive communication. Group members shared and reflected on which styles they most often find themselves communicating in and brainstormed strategies on how to transition and practice a more assertive approach. Further discussion explored how to use assertiveness skills and strategies to further advocate and ask questions as it relates to their treatment plan and mental health.   Therapeutic Goal(s): Identify practical strategies to improve communication skills  Identify how to use assertive communication skills to address individual needs and wants   Participation Level: Engaged   Participation Quality: Independent   Behavior: Appropriate   Speech/Thought Process: Relevant   Affect/Mood: Appropriate   Insight: Fair   Judgement: Fair      Modes of Intervention: Education  Patient Response to Interventions:  Attentive   Plan: Continue to engage patient in OT groups 2 - 3x/week.  10/29/2023  Ted Mcalpine, OT  Kerrin Champagne, OT

## 2023-10-29 NOTE — Plan of Care (Signed)

## 2023-10-29 NOTE — Group Note (Signed)
 Date:  10/29/2023 Time:  9:06 PM  Group Topic/Focus:  Alcoholics Anonymous (AA) Meeting    Participation Level:  Active  Participation Quality:  Appropriate  Affect:  Appropriate  Cognitive:  Appropriate  Insight: Appropriate  Engagement in Group:  Engaged  Modes of Intervention:  Discussion, Socialization, and Support  Additional Comments:  Patient attended AA  [name] 10/29/2023, 9:06 PM

## 2023-10-29 NOTE — BH IP Treatment Plan (Addendum)
 Interdisciplinary Treatment and Diagnostic Plan Update  10/29/2023 Time of Session: 1135 Alexa Guerra MRN: [id number]  Principal Diagnosis: Substance induced mood disorder (HCC)  Secondary Diagnoses: Principal Problem:   Substance induced mood disorder (HCC) Active Problems:   Suicidal ideation   Severe opioid use disorder (HCC)   Opioid withdrawal (HCC)   Current Medications:  Current Facility-Administered Medications  Medication Dose Route Frequency Provider Last Rate Last Admin   alum & mag hydroxide-simeth (MAALOX/MYLANTA) 200-200-20 MG/5ML suspension 30 mL  30 mL Oral Q4H PRN Onuoha, Chinwendu V, NP       busPIRone (BUSPAR) tablet 5 mg  5 mg Oral TID Golda Acre, MD   5 mg at 10/29/23 1255   chlorproMAZINE (THORAZINE) injection 25 mg  25 mg Intramuscular TID PRN Golda Acre, MD       cloNIDine (CATAPRES) tablet 0.1 mg  0.1 mg Oral QID Golda Acre, MD   0.1 mg at 10/29/23 0750   Followed by   Melene Muller ON 10/30/2023] cloNIDine (CATAPRES) tablet 0.1 mg  0.1 mg Oral BH-qamhs Golda Acre, MD       Followed by   Melene Muller ON 11/01/2023] cloNIDine (CATAPRES) tablet 0.1 mg  0.1 mg Oral QAC breakfast Golda Acre, MD       cyanocobalamin (VITAMIN B12) tablet 1,000 mcg  1,000 mcg Oral Daily Golda Acre, MD   1,000 mcg at 10/29/23 0750   dicyclomine (BENTYL) tablet 20 mg  20 mg Oral Q6H PRN Onuoha, Chinwendu V, NP   20 mg at 10/27/23 4098   haloperidol (HALDOL) tablet 5 mg  5 mg Oral TID PRN Onuoha, Chinwendu V, NP       And   diphenhydrAMINE (BENADRYL) capsule 50 mg  50 mg Oral TID PRN Onuoha, Chinwendu V, NP       haloperidol lactate (HALDOL) injection 5 mg  5 mg Intramuscular TID PRN Onuoha, Chinwendu V, NP       And   diphenhydrAMINE (BENADRYL) injection 50 mg  50 mg Intramuscular TID PRN Onuoha, Chinwendu V, NP       And   LORazepam (ATIVAN) injection 2 mg  2 mg Intramuscular TID PRN Onuoha, Chinwendu V, NP       haloperidol lactate (HALDOL) injection 10 mg  10  mg Intramuscular TID PRN Onuoha, Chinwendu V, NP       And   diphenhydrAMINE (BENADRYL) injection 50 mg  50 mg Intramuscular TID PRN Onuoha, Chinwendu V, NP       And   LORazepam (ATIVAN) injection 2 mg  2 mg Intramuscular TID PRN Onuoha, Chinwendu V, NP       escitalopram (LEXAPRO) tablet 10 mg  10 mg Oral QHS Golda Acre, MD   10 mg at 10/28/23 2055   hydrOXYzine (ATARAX) tablet 25 mg  25 mg Oral TID Golda Acre, MD   25 mg at 10/29/23 1256   loperamide (IMODIUM) capsule 2-4 mg  2-4 mg Oral PRN Onuoha, Chinwendu V, NP       lurasidone (LATUDA) tablet 20 mg  20 mg Oral Q supper Golda Acre, MD   20 mg at 10/28/23 1720   magnesium hydroxide (MILK OF MAGNESIA) suspension 30 mL  30 mL Oral Daily PRN Onuoha, Chinwendu V, NP   30 mL at 10/28/23 1304   methocarbamol (ROBAXIN) tablet 500 mg  500 mg Oral Q8H PRN Onuoha, Chinwendu V, NP   500 mg at 10/29/23 0753   naproxen (NAPROSYN)  tablet 500 mg  500 mg Oral BID PRN Onuoha, Chinwendu V, NP   500 mg at 10/29/23 0753   nicotine (NICODERM CQ - dosed in mg/24 hours) patch 21 mg  21 mg Transdermal Daily Golda Acre, MD   21 mg at 10/29/23 1610   nicotine polacrilex (NICORETTE) gum 2 mg  2 mg Oral PRN Abbott Pao, Nadir, MD   2 mg at 10/29/23 1255   ondansetron (ZOFRAN-ODT) disintegrating tablet 4 mg  4 mg Oral Q8H PRN Golda Acre, MD   4 mg at 10/29/23 0754   traZODone (DESYREL) tablet 50 mg  50 mg Oral QHS Golda Acre, MD   50 mg at 10/28/23 2054   Vitamin D (Ergocalciferol) (DRISDOL) 1.25 MG (50000 UNIT) capsule 50,000 Units  50,000 Units Oral Q7 days Golda Acre, MD   50,000 Units at 10/28/23 1106   vitamin D3 (CHOLECALCIFEROL) tablet 2,000 Units  2,000 Units Oral BID Golda Acre, MD   2,000 Units at 10/29/23 0750   PTA Medications: Medications Prior to Admission  Medication Sig Dispense Refill Last Dose/Taking   FLUoxetine (PROZAC) 40 MG capsule Take by mouth. (Patient not taking: Reported on 10/26/2023)       Patient  Stressors: Substance abuse    Patient Strengths: Motivation for treatment/growth  Supportive family/friends   Treatment Modalities: Medication Management, Group therapy, Case management,  1 to 1 session with clinician, Psychoeducation, Recreational therapy.   Physician Treatment Plan for Primary Diagnosis: Substance induced mood disorder (HCC) Long Term Goal(s):     Short Term Goals:    Medication Management: Evaluate patient's response, side effects, and tolerance of medication regimen.  Therapeutic Interventions: 1 to 1 sessions, Unit Group sessions and Medication administration.  Evaluation of Outcomes: Not Progressing  Physician Treatment Plan for Secondary Diagnosis: Principal Problem:   Substance induced mood disorder (HCC) Active Problems:   Suicidal ideation   Severe opioid use disorder (HCC)   Opioid withdrawal (HCC)  Long Term Goal(s):     Short Term Goals:       Medication Management: Evaluate patient's response, side effects, and tolerance of medication regimen.  Therapeutic Interventions: 1 to 1 sessions, Unit Group sessions and Medication administration.  Evaluation of Outcomes: Not Progressing   RN Treatment Plan for Primary Diagnosis: Substance induced mood disorder (HCC) Long Term Goal(s): Knowledge of disease and therapeutic regimen to maintain health will improve  Short Term Goals: Ability to remain free from injury will improve, Ability to verbalize frustration and anger appropriately will improve, Ability to demonstrate self-control, Ability to participate in decision making will improve, Ability to verbalize feelings will improve, Ability to disclose and discuss suicidal ideas, Ability to identify and develop effective coping behaviors will improve, and Compliance with prescribed medications will improve  Medication Management: RN will administer medications as ordered by provider, will assess and evaluate patient's response and provide education to  patient for prescribed medication. RN will report any adverse and/or side effects to prescribing provider.  Therapeutic Interventions: 1 on 1 counseling sessions, Psychoeducation, Medication administration, Evaluate responses to treatment, Monitor vital signs and CBGs as ordered, Perform/monitor CIWA, COWS, AIMS and Fall Risk screenings as ordered, Perform wound care treatments as ordered.  Evaluation of Outcomes: Not Progressing   LCSW Treatment Plan for Primary Diagnosis: Substance induced mood disorder (HCC) Long Term Goal(s): Safe transition to appropriate next level of care at discharge, Engage patient in therapeutic group addressing interpersonal concerns.  Short Term Goals: Engage patient in aftercare planning  with referrals and resources, Increase social support, Increase ability to appropriately verbalize feelings, Increase emotional regulation, Facilitate acceptance of mental health diagnosis and concerns, Facilitate patient progression through stages of change regarding substance use diagnoses and concerns, Identify triggers associated with mental health/substance abuse issues, and Increase skills for wellness and recovery  Therapeutic Interventions: Assess for all discharge needs, 1 to 1 time with Social worker, Explore available resources and support systems, Assess for adequacy in community support network, Educate family and significant other(s) on suicide prevention, Complete Psychosocial Assessment, Interpersonal group therapy.  Evaluation of Outcomes: Not Progressing   Progress in Treatment: Attending groups: Yes. Participating in groups: Yes. Taking medication as prescribed: Yes. Toleration medication: Yes. Family/Significant other contact made: No, will contact:  Pt's significant other is a currently admitted as well  Patient understands diagnosis: Yes. Discussing patient identified problems/goals with staff: Yes. Medical problems stabilized or resolved: Yes. Denies  suicidal/homicidal ideation: Yes. Issues/concerns per patient self-inventory: No.  New problem(s) identified: No, Describe:  none  New Short Term/Long Term Goal(s): detox, medication management for mood stabilization; elimination of SI thoughts; development of comprehensive mental wellness/sobriety plan   Patient Goals:  "Trying to get clean, stay positive, stay on my meds"  Discharge Plan or Barriers: Patient recently admitted. CSW will continue to follow and assess for appropriate referrals and possible discharge planning.    Reason for Continuation of Hospitalization: Depression Medication stabilization Suicidal ideation Withdrawal symptoms Other; describe substance induced mood disorder  Estimated Length of Stay: 5-7 days  Last 3 [place] Suicide Severity Risk Score: Flowsheet Row Admission (Current) [facility] from 10[facility]25/2023 in [facility] ED from 01/04/2021 in [facility] at [facility]  C-SSRS RISK CATEGORY Moderate Risk No Risk No Risk       Last PHQ 2/9 Scores:     No data to display          Scribe for Treatment Team: Kathi Der, Theresia Majors 10/29/2023 2:48 PM

## 2023-10-29 NOTE — Group Note (Signed)
 Date:  10/29/2023 Time:  4:46 PM  Group Topic/Focus:  Emotional Education:   The focus of this group is to discuss what feelings/emotions are, and how they are experienced. Managing Feelings:   The focus of this group is to identify what feelings patients have difficulty handling and develop a plan to handle them in a healthier way upon discharge.    Participation Level:  Active  Participation Quality:  Appropriate  Affect:  Appropriate  Cognitive:  Appropriate  Insight: Appropriate  Engagement in Group:  Engaged  Modes of Intervention:  Discussion and Socialization  Additional Comments:   Pt attended the Emotional Wellness group.  Alexa Guerra 10/29/2023, 4:46 PM

## 2023-10-29 NOTE — Group Note (Signed)
 Date:  10/29/2023 Time:  5:01 PM  Group Topic/Focus:  Self Care:   The focus of this group is to help patients understand the importance of self-care in order to improve or restore emotional, physical, spiritual, interpersonal, and financial health.    Participation Level:  Active  Participation Quality:  Appropriate  Affect:  Appropriate  Cognitive:  Appropriate  Insight: Appropriate  Engagement in Group:  Engaged  Modes of Intervention:  Education  Additional Comments:   Pt attended the Physical Wellness group.   Edmund Hilda Zebbie Ace 10/29/2023, 5:01 PM

## 2023-10-29 NOTE — Group Note (Signed)
 Date:  10/29/2023 Time:  4:27 PM  Group Topic/Focus:  Goals Group:   The focus of this group is to help patients establish daily goals to achieve during treatment and discuss how the patient can incorporate goal setting into their daily lives to aide in recovery. Orientation:   The focus of this group is to educate the patient on the purpose and policies of crisis stabilization and provide a format to answer questions about their admission.  The group details unit policies and expectations of patients while admitted.    Participation Level:  Active  Participation Quality:  Appropriate  Affect:  Appropriate  Cognitive:  Appropriate  Insight: Appropriate  Engagement in Group:  Engaged  Modes of Intervention:  Discussion and Orientation  Additional Comments:   Pt attended the Orientation and Goals group.  Edmund Hilda Estee Yohe 10/29/2023, 4:27 PM

## 2023-10-30 DIAGNOSIS — F1994 Other psychoactive substance use, unspecified with psychoactive substance-induced mood disorder: Secondary | ICD-10-CM | POA: Diagnosis not present

## 2023-10-30 MED ORDER — TRAZODONE HCL 100 MG PO TABS
100.0000 mg | ORAL_TABLET | Freq: Every day | ORAL | Status: DC
  Administered 2023-10-30 – 2023-10-31 (×2): 100 mg via ORAL
  Filled 2023-10-30 (×4): qty 1

## 2023-10-30 NOTE — Progress Notes (Signed)
   10/30/23 2300  Psych Admission Type (Psych Patients Only)  Admission Status Voluntary  Psychosocial Assessment  Patient Complaints Anxiety;Depression  Eye Contact Fair  Facial Expression Anxious  Affect Anxious  Speech Logical/coherent  Interaction Assertive  Motor Activity Shuffling  Appearance/Hygiene Unremarkable  Behavior Characteristics Cooperative;Appropriate to situation  Mood Anxious  Thought Process  Coherency WDL  Content WDL  Delusions None reported or observed  Perception WDL  Hallucination None reported or observed  Judgment Impaired  Confusion None  Danger to Self  Current suicidal ideation? Denies  Self-Injurious Behavior No self-injurious ideation or behavior indicators observed or expressed   Agreement Not to Harm Self Yes  Description of Agreement verbal  Danger to Others  Danger to Others None reported or observed

## 2023-10-30 NOTE — [facility] Group Notes (Signed)
 Spiritual care group on grief and loss facilitated by Chaplain Dyanne Carrel, [facility]  Group Goal: Support / Education around grief and loss  Members engage in facilitated group support and psycho-social education.  Group Description:  Following introductions and group rules, group members engaged in facilitated group dialogue and support around topic of loss, with particular support around experiences of loss in their lives. Group Identified types of loss (relationships / self / things) and identified patterns, circumstances, and changes that precipitate losses. Reflected on thoughts / feelings around loss, normalized grief responses, and recognized variety in grief experience. Group encouraged individual reflection on safe space and on the coping skills that they are already utilizing.  Group drew on Adlerian / Rogerian and narrative framework  Patient Progress: Alexa Guerra attended group and actively engaged and participated in group conversation and activities. Comments demonstrated good insight and she was supportive of a peer.

## 2023-10-30 NOTE — [facility] Group Notes (Signed)
 [facility] Group Notes:  (Nursing/MHT/Case Management/Adjunct)  Date:  10/30/2023  Time:  9:14 PM  Type of Therapy:   Wrap-up group  Participation Level:  Active  Participation Quality:  Appropriate  Affect:  Appropriate  Cognitive:  Appropriate  Insight:  Appropriate  Engagement in Group:  Engaged  Modes of Intervention:  Education  Summary of Progress/Problems: Goal to get thru the day. Rated her day 7/10.   Noah Delaine 10/30/2023, 9:14 PM

## 2023-10-30 NOTE — Plan of Care (Signed)
   Problem: Education: Goal: Knowledge of [facility] Education information/materials will improve Outcome: Progressing   Problem: Activity: Goal: Interest or engagement in activities will improve Outcome: Progressing   Problem: Coping: Goal: Ability to verbalize frustrations and anger appropriately will improve Outcome: Progressing

## 2023-10-30 NOTE — Group Note (Signed)
 Recreation Therapy Group Note   Group Topic:Animal Assisted Therapy   Group Date: 10/30/2023 Start Time: 0945 End Time: 1031 Facilitators: Sion Thane-McCall, LRT,CTRS Location: 300 Hall Dayroom   Animal-Assisted Activity (AAA) Program Checklist/Progress Notes Patient Eligibility Criteria Checklist & Daily Group note for Rec Tx Intervention  AAA/T Program Assumption of Risk Form signed by Patient/ or Parent Legal Guardian Yes  Patient is free of allergies or severe asthma Yes  Patient reports no fear of animals Yes  Patient reports no history of cruelty to animals Yes  Patient understands his/her participation is voluntary Yes  Patient washes hands before animal contact Yes  Patient washes hands after animal contact Yes  Education: Hand Washing, Appropriate Animal Interaction   Education Outcome: Acknowledges education.   Clinical Observations/Feedback: Patient attended session and interacted appropriately with therapy dog and peers. Patient asked appropriate questions about therapy dog and his training. Patient shared stories about their pets at home with group.   Jearl Klinefelter, LRT/CTRS    Affect/Mood: Appropriate   Participation Level: Engaged   Participation Quality: Independent   Behavior: Appropriate   Speech/Thought Process: Focused   Insight: Good   Judgement: Good   Modes of Intervention: Teaching laboratory technician   Patient Response to Interventions:  Engaged   Education Outcome:  In group clarification offered    Clinical Observations/Individualized Feedback: Patient attended session and interacted appropriately with therapy dog and peers. Patient asked appropriate questions about therapy dog and his training. Patient shared stories about their pets at home with group.      Plan: Continue to engage patient in RT group sessions 2-3x/week.   Ivanell Deshotel-McCall, LRT,CTRS  10/30/2023 1:03 PM

## 2023-10-30 NOTE — Progress Notes (Signed)
   10/30/23 0530  15 Minute Checks  Location Bedroom  Visual Appearance Calm  Behavior Sleeping  Sleep (Behavioral Health Patients Only)  Calculate sleep? (Click Yes once per 24 hr at 0600 safety check) Yes  Documented sleep last 24 hours 7.75

## 2023-10-30 NOTE — Progress Notes (Signed)
   10/30/23 0845  Psych Admission Type (Psych Patients Only)  Admission Status Voluntary  Psychosocial Assessment  Patient Complaints Anxiety;Depression  Eye Contact Fair  Facial Expression Anxious  Affect Anxious  Speech Logical/coherent  Interaction Assertive  Motor Activity Fidgety  Appearance/Hygiene Unremarkable  Behavior Characteristics Cooperative  Mood Anxious  Thought Process  Coherency WDL  Content WDL  Delusions None reported or observed  Perception WDL  Hallucination None reported or observed  Judgment Impaired  Confusion None  Danger to Self  Current suicidal ideation? Denies  Self-Injurious Behavior No self-injurious ideation or behavior indicators observed or expressed   Agreement Not to Harm Self Yes  Description of Agreement Verbal  Danger to Others  Danger to Others None reported or observed

## 2023-10-30 NOTE — Group Note (Signed)
 Date:  10/30/2023 Time:  8:50 AM  Group Topic/Focus:  Goals Group:   The focus of this group is to help patients establish daily goals to achieve during treatment and discuss how the patient can incorporate goal setting into their daily lives to aide in recovery. Orientation:   The focus of this group is to educate the patient on the purpose and policies of crisis stabilization and provide a format to answer questions about their admission.  The group details unit policies and expectations of patients while admitted.    Participation Level:  Active  Participation Quality:  Appropriate  Affect:  Appropriate  Cognitive:  Appropriate  Insight: Appropriate  Engagement in Group:  Engaged  Modes of Intervention:  Discussion and Orientation  Additional Comments:  no goal  Azalee Course 10/30/2023, 8:50 AM
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
## 2023-10-30 NOTE — Group Note (Signed)
 LCSW Group Therapy Note   Group Date: 10/30/2023 Start Time: 1100 End Time: 1200   Participation:  patient was present  Type of Therapy:  Group Therapy  Title:  Speaking from the Heart: Communicating with Understanding and Empathy  Objective:  To help participants develop effective communication skills to express themselves clearly, listen actively, and navigate conflicts in a healthy way.  Goals: Increase awareness of verbal and non-verbal communication skills. Practice using "I" statements and active listening techniques. Learn coping strategies for managing communication stress.  Summary: Participants explored the importance of communication, discussed challenges, and practiced skills such as active listening and assertive expression. They reflected on past experiences and identified ways to improve communication in their daily lives.  Therapeutic Modalities: Cognitive-Behavioral Therapy (CBT): Restructuring negative thought patterns in communication. Mindfulness: Staying present and calm during conversations.   Alla Feeling, LCSWA 10/30/2023  12:21 PM

## 2023-10-30 NOTE — Plan of Care (Signed)
   Problem: Education: Goal: Emotional status will improve Outcome: Progressing Goal: Mental status will improve Outcome: Progressing   Problem: Activity: Goal: Interest or engagement in activities will improve Outcome: Progressing Goal: Sleeping patterns will improve Outcome: Progressing

## 2023-10-31 DIAGNOSIS — F1994 Other psychoactive substance use, unspecified with psychoactive substance-induced mood disorder: Secondary | ICD-10-CM | POA: Diagnosis not present

## 2023-10-31 LAB — OPIATE, QUANTITATIVE, URINE
OXYCODONE+OXYMORPHONE UR QL SCN: NEGATIVE
Opiates: NEGATIVE

## 2023-10-31 MED ORDER — LURASIDONE HCL 20 MG PO TABS
20.0000 mg | ORAL_TABLET | Freq: Every day | ORAL | Status: DC
  Administered 2023-10-31: 20 mg via ORAL
  Filled 2023-10-31 (×3): qty 1

## 2023-10-31 NOTE — Progress Notes (Signed)
 D: Patient is alert, oriented, pleasant, and cooperative. Denies SI, HI, AVH, and verbally contracts for safety. Patient reports she slept poor last night with sleeping medication. Patient reports her appetite as fair, energy level as low, and concentration as poor. Patient rates her depression 5/10, hopelessness 5/10, and anxiety 6/10. Patient reports leg pain.   A: Scheduled medications administered per MD order. PRN naproxen, robaxin, and nicotine gum administered. Support provided. Patient educated on safety on the unit and medications. Routine safety checks every 15 minutes. Patient stated understanding to tell nurse about any new physical symptoms. Patient understands to tell staff of any needs.     R: No adverse drug reactions noted. Patient remains safe at this time and will continue to monitor.    10/31/23 1000  Psych Admission Type (Psych Patients Only)  Admission Status Voluntary  Psychosocial Assessment  Patient Complaints None  Eye Contact Fair  Facial Expression Anxious  Affect Anxious  Speech Logical/coherent  Interaction Assertive  Motor Activity Other (Comment) (WNL)  Appearance/Hygiene Unremarkable  Behavior Characteristics Cooperative;Appropriate to situation  Mood Anxious;Pleasant  Thought Process  Coherency WDL  Content WDL  Delusions None reported or observed  Perception WDL  Hallucination None reported or observed  Judgment Impaired  Confusion None  Danger to Self  Current suicidal ideation? Denies  Self-Injurious Behavior No self-injurious ideation or behavior indicators observed or expressed   Agreement Not to Harm Self Yes  Description of Agreement verbal  Danger to Others  Danger to Others None reported or observed

## 2023-10-31 NOTE — Group Note (Signed)
 Date:  10/31/2023 Time:  4:34 PM  Group Topic/Focus:  Goals Group:   The focus of this group is to help patients establish daily goals to achieve during treatment and discuss how the patient can incorporate goal setting into their daily lives to aide in recovery. Orientation:   The focus of this group is to educate the patient on the purpose and policies of crisis stabilization and provide a format to answer questions about their admission.  The group details unit policies and expectations of patients while admitted.    Participation Level:  Active  Participation Quality:  Appropriate  Affect:  Appropriate  Cognitive:  Appropriate  Insight: Appropriate  Engagement in Group:  Engaged  Modes of Intervention:  Discussion and Orientation  Additional Comments:   Pt attended the Orientation and Goals group.  Edmund Hilda Hinley Brimage 10/31/2023, 4:34 PM

## 2023-10-31 NOTE — Group Note (Signed)
 Date:  10/31/2023 Time:  5:06 PM  Group Topic/Focus:  Dimensions of Wellness:   The focus of this group is to introduce the topic of wellness and discuss the role each dimension of wellness plays in total health.    Participation Level:  Active  Participation Quality:  Appropriate  Affect:  Appropriate  Cognitive:  Appropriate  Insight: Appropriate  Engagement in Group:  Engaged  Modes of Intervention:  Activity and Education  Additional Comments:   Pt attended the Wellness group and completed the Dimension of Wellness activity.  Alexa Guerra 10/31/2023, 5:06 PM

## 2023-10-31 NOTE — Progress Notes (Signed)
   10/31/23 2228  Psych Admission Type (Psych Patients Only)  Admission Status Voluntary  Psychosocial Assessment  Patient Complaints None  Eye Contact Fair  Facial Expression Anxious  Affect Anxious  Speech Logical/coherent  Interaction Assertive  Motor Activity Shuffling  Appearance/Hygiene Unremarkable  Behavior Characteristics Cooperative;Appropriate to situation  Mood Pleasant  Thought Process  Coherency WDL  Content WDL  Delusions None reported or observed  Perception WDL  Hallucination None reported or observed  Judgment Impaired  Confusion None  Danger to Self  Current suicidal ideation? Denies  Self-Injurious Behavior No self-injurious ideation or behavior indicators observed or expressed   Agreement Not to Harm Self Yes  Description of Agreement verbal  Danger to Others  Danger to Others None reported or observed

## 2023-10-31 NOTE — Group Note (Signed)
 Recreation Therapy Group Note   Group Topic:Communication  Group Date: 10/31/2023 Start Time: 0935 End Time: 1012 Facilitators:Tonatiuh Mallon-McCall, LRT,CTRS Location: [address] Day[address]  Group Topic: Communication, Problem Solving   Goal Area(s) Addresses:  Patient will effectively listen to complete activity.  Patient will identify communication skills used to make activity successful.  Patient will identify how skills used during activity can be used to reach post d/c goals.    Intervention: Building surveyor Activity - Geometric pattern cards, pencils, blank paper    Activity: Geometric Drawings.  Three volunteers from the peer group will be shown an abstract picture with a particular arrangement of geometrical shapes.  Each round, one 'speaker' will describe the pattern, as accurately as possible without revealing the image to the group.  The remaining group members will listen and draw the picture to reflect how it is described to them. Patients with the role of 'listener' cannot ask clarifying questions but, may request that the speaker repeat a direction. Once the drawings are complete, the presenter will show the rest of the group the picture and compare how close each person came to drawing the picture. LRT will facilitate a post-activity discussion regarding effective communication and the importance of planning, listening, and asking for clarification in daily interactions with others.  Education: Environmental consultant, Active listening, Support systems, Discharge planning  Education Outcome: Acknowledges understanding/In group clarification offered/Needs additional education.     Affect/Mood: Appropriate   Participation Level: Engaged   Participation Quality: Independent   Behavior: Appropriate   Speech/Thought Process: Focused   Insight: Good   Judgement: Good   Modes of Intervention: Activity   Patient Response to Interventions:  Engaged   Education  Outcome:  In group clarification offered    Clinical Observations/Individualized Feedback: Pt presented the bonus to the group. Pt was focused and seemed a little timid but was able to get through it.      Plan: Continue to engage patient in RT group sessions 2-3x/week.   [name], LRT,CTRS 10/31/2023 11:55 AM

## 2023-10-31 NOTE — Plan of Care (Signed)

## 2023-10-31 NOTE — [facility] Group Notes (Signed)
 [facility] Group Notes:  (Nursing/MHT/Case Management/Adjunct)  Date:  10/31/2023  Time:  2000  Type of Therapy:   NA  Participation Level:  Active  Participation Quality:  Appropriate  Affect:  Appropriate  Cognitive:  Alert  Insight:  Appropriate  Engagement in Group:  Engaged  Modes of Intervention:  Support  Summary of Progress/Problems: Attentive  Alexa Guerra 10/31/2023, 2000
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
## 2023-10-31 NOTE — Plan of Care (Signed)
   Problem: Education: Goal: Knowledge of [facility] Education information/materials will improve Outcome: Progressing   Problem: Activity: Goal: Interest or engagement in activities will improve Outcome: Progressing   Problem: Coping: Goal: Ability to verbalize frustrations and anger appropriately will improve Outcome: Progressing

## 2023-10-31 NOTE — [facility] Group Notes (Signed)
 Spirituality Group Focus of discussion: Gratitude and Strength Awareness  Process: Following theoretical framework of group therapy of Chyrl Civatte and further informed by Rogerian and Relational Cultural Theory approaches, participants invited to name:  Sources of gratitude (internal>external) Articulate gratitude for self Name a personal strength/gift/skill Locate points of resonance among group members/engage the "here and now" Conclude with grounding/breathwork  Observations: Alexa Guerra was an engaged participant but stayed mostly reserved and quiet while listening.  Alexa Guerra, M.Div [phone]

## 2023-11-01 DIAGNOSIS — F1994 Other psychoactive substance use, unspecified with psychoactive substance-induced mood disorder: Secondary | ICD-10-CM | POA: Diagnosis not present

## 2023-11-01 MED ORDER — HYDROXYZINE HCL 25 MG PO TABS: 25.0000 mg | ORAL_TABLET | Freq: Three times a day (TID) | ORAL | 0 refills | Status: AC

## 2023-11-01 MED ORDER — ESCITALOPRAM OXALATE 10 MG PO TABS: 10.0000 mg | ORAL_TABLET | Freq: Every day | ORAL | 0 refills | Status: AC

## 2023-11-01 MED ORDER — LURASIDONE HCL 20 MG PO TABS: 20.0000 mg | ORAL_TABLET | Freq: Every day | ORAL | 0 refills | Status: AC

## 2023-11-01 MED ORDER — BUSPIRONE HCL 5 MG PO TABS: 5.0000 mg | ORAL_TABLET | Freq: Three times a day (TID) | ORAL | 0 refills | Status: AC

## 2023-11-01 MED ORDER — TRAZODONE HCL 100 MG PO TABS: 100.0000 mg | ORAL_TABLET | Freq: Every day | ORAL | 0 refills | Status: AC

## 2023-11-01 MED ORDER — NICOTINE 21 MG/24HR TD PT24
21.0000 mg | MEDICATED_PATCH | Freq: Every day | TRANSDERMAL | 0 refills | Status: AC
Start: 2023-11-02 — End: ?

## 2023-11-01 NOTE — Transportation (Signed)
 11/01/2023  Alexa Guerra DOB: Feb 22, 1991 MRN: [id number]   RIDER WAIVER AND RELEASE OF LIABILITY  For the purposes of helping with transportation needs, [facility] partners with outside transportation providers (taxi companies, [place], Catering manager.) to give [employer] patients or other approved people the choice of on-demand rides [employer]") to our buildings for non-emergency visits.  By using [employer], I, the person signing this document, on behalf of myself and/or any legal minors (in my care using the [employer]), agree:  Science writer given to me are supplied by independent, outside transportation providers who do not work for, or have any affiliation with, [employer]. [place] is not a transportation company. [facility] has no control over the quality or safety of the rides I get using [employer]. [facility] has no control over whether any outside ride will happen on time or not. [facility] gives no guarantee on the reliability, quality, safety, or availability on any rides, or that no mistakes will happen. I know and accept that traveling by vehicle (car, truck, SVU, Zenaida Niece, bus, taxi, etc.) has risks of serious injuries such as disability, being paralyzed, and death. I know and agree the risk of using [employer] is mine alone, and not [employer]. Transport Services are provided "as is" and as are available. The transportation providers are in charge for all inspections and care of the vehicles used to provide these rides. I agree not to take legal action against [facility], its agents, employees, officers, directors, representatives, insurers, attorneys, assigns, successors, subsidiaries, and affiliates at any time for any reasons related directly or indirectly to using [employer]. I also agree not to take legal action against [facility] or its affiliates for any injury, death, or damage to property caused by or related to using  [employer]. I have read this Waiver and Release of Liability, and I understand the terms used in it and their legal meaning. This Waiver is freely and voluntarily given with the understanding that my right (or any legal minors) to legal action against  relating to [employer] is knowingly given up to use these services.   I attest that I read the Ride Waiver and Release of Liability to Alexa Guerra, gave Alexa Guerra the opportunity to ask questions and answered the questions asked (if any). I affirm that Alexa Guerra then provided consent for assistance with transportation.

## 2023-11-01 NOTE — Progress Notes (Signed)
 Patient discharged from [facility] on 11/01/23 at 1300. Patient denies SI, plan, and intention. Suicide safety plan completed, reviewed with this RN, given to the patient, and a copy in the chart. Patient denies HI/AVH upon discharge. Patient rates her depression a 0/10 and her anxiety a 0/10. Patient is alert, oriented, and cooperative. RN provided patient with discharge paperwork and reviewed information with patient. Patient expressed that she understood all of the discharge instructions. Pt was satisfied with belongings returned to her from the locker and at bedside. Discharged patient to [facility] waiting room. Pt's blue bird taxi driver awaiting patient in the [facility] waiting room.

## 2023-11-01 NOTE — Progress Notes (Signed)
 [facility] Adult Case Management Discharge Plan :  Will you be returning to the same living situation after discharge:  Yes,  home At discharge, do you have transportation home?: Yes,  CSW arranged via [employer]. Do you have the ability to pay for your medications: Yes,  active Medicaid  Release of information consent forms completed and in the chart;  Patient's signature needed at discharge.  Patient to Follow up at:  Follow-up Information     [facility] Follow up on 11/09/2023.   Why: You have a hospital follow up appointment for therapy and medication management services on 11/09/23 at 8:00 am.  This will be a Virtual telehealth appt. Contact information: [address]  [address] [place] [address] [phone]                 Next level of care provider has access to [facility] Link:no  Safety Planning and Suicide Prevention discussed: Yes,  completed with Pt prior to discharge  Has patient been referred to the Quitline?: Patient refused referral for treatment  Patient has been referred for addiction treatment: Yes, the patient will follow up with an outpatient provider for substance use disorder. Psychiatrist/APP: appointment made - referred for services at [facility], LCSW 11/01/2023, 9:15 AM
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
## 2023-11-01 NOTE — BHH Suicide Risk Assessment (Addendum)
 [facility] Discharge Suicide Risk Assessment   Principal Problem: Substance induced mood disorder (HCC) Discharge Diagnoses: Principal Problem:   Substance induced mood disorder (HCC) Active Problems:   Suicidal ideation   Severe opioid use disorder (HCC)   Opioid withdrawal (HCC)   Total Time spent with patient: 20 minutes  Musculoskeletal: Strength & Muscle Tone: within normal limits Gait & Station: normal Patient leans: N/A  Psychiatric Specialty Exam  Presentation  General Appearance:  Casually dressed, engaged politely.  Appropriate behavior.  No EPS.   Eye Contact: Good   Speech: Spontaneous.  Normal rate, tone and volume.  Normal prosody of speech.     Mood and Affect  Mood: Euthymic.   Affect: Full range and and mood congruent.   Thought Process  Thought Processes: Linear   Descriptions of Associations:Intact   Orientation:Full (Time, Place and Person)   Thought Content: Future-oriented.  No negative rumination.  No guilty ruminations.  No current suicidal thoughts.  No homicidal thoughts.  No thoughts of violence.  No delusional theme.   Hallucinations: No hallucination in any modality.   Sensorium  Memory: Immediate Good; Recent Good   Judgment: Good.   Insight: Limited as patient is not keen on in-house addiction treatment.  She will consider MAT in the community.   Executive Functions  Concentration: Good.   Attention Span: Good   Recall: Good   Fund of Knowledge: Good   Language: Good     Psychomotor Activity  Normal psychomotor activity.  Physical Exam: Physical Exam ROS Blood pressure 118/80, pulse 77, temperature (!) 97.5 F (36.4 C), temperature source Oral, resp. rate 16, height 5\' 3"  (1.6 m), weight 79.6 kg, SpO2 100%. Body mass index is 31.07 kg/m.  Mental Status Per Nursing Assessment::   On Admission:  NA  Demographic Factors:  Caucasian  Loss Factors: Financial problems/change in socioeconomic  status  Historical Factors: Family history of mental illness or substance abuse and Impulsivity  Risk Reduction Factors:   Responsible for children under 45 years of age, Sense of responsibility to family, Living with another person, especially a relative, Positive social support, Positive therapeutic relationship, and Positive coping skills or problem solving skills  Continued Clinical Symptoms:  Patient is not withdrawals from any psychoactive substance.  Patient is not exhibiting any features of depression.  No manic features.  No psychotic features.  No current cravings for psychoactive substance.  Cognitive Features That Contribute To Risk:  None    Suicide Risk:  Minimal: No current suicidal thoughts.  No homicidal thoughts.  No thoughts of violence.  Modifiable risk factors targeted during this admission as substance withdrawal and mood symptoms.  The patient has stabilized on her current regimen.  She has good support from her significant other and from her mother.  No new psychosocial stressor.  Sleep-wake cycle is well regularized.  No overwhelming anxiety. Patient is not a danger to herself or to anyone else.  At this point in time, patient is stable for care at a lower setting.  Follow-up Information     [facility] Follow up on 11/09/2023.   Why: You have a hospital follow up appointment for therapy and medication management services on 11/09/23 at 8:00 am.  This will be a Virtual telehealth appt. Contact information: [address]  [address] [place] [address] [phone]                 Plan Of Care/Follow-up recommendations:  See discharge summary.  Georgiann Cocker, MD 11/01/2023,  9:18 AM

## 2023-11-01 NOTE — Plan of Care (Signed)
   Problem: Education: Goal: Emotional status will improve Outcome: Progressing Goal: Mental status will improve Outcome: Progressing   Problem: Activity: Goal: Interest or engagement in activities will improve Outcome: Progressing Goal: Sleeping patterns will improve Outcome: Progressing

## 2023-11-01 NOTE — Progress Notes (Addendum)
 Patient denies SI/HI/AVH this morning. Pt rates her depression a 0/10 and anxiety a 0/10. Pt reports that she slept "well" last night. Pt has been interactive on the unit and participating in groups throughout the day. Pt has been calm and cooperative throughout the day. Patient has been compliant with medications and treatment plan. Q 15 minute safety checks are in place for patient's safety. Patient is currently safe on the unit.   11/01/23 0820  Psych Admission Type (Psych Patients Only)  Admission Status Voluntary  Psychosocial Assessment  Patient Complaints None  Eye Contact Fair  Facial Expression Animated  Affect Appropriate to circumstance  Speech Logical/coherent  Interaction Assertive  Motor Activity Fidgety  Appearance/Hygiene Unremarkable  Behavior Characteristics Cooperative;Appropriate to situation  Mood Pleasant  Thought Process  Coherency WDL  Content WDL  Delusions None reported or observed  Perception WDL  Hallucination None reported or observed  Judgment Impaired  Confusion None  Danger to Self  Current suicidal ideation? Denies  Self-Injurious Behavior No self-injurious ideation or behavior indicators observed or expressed   Agreement Not to Harm Self Yes  Description of Agreement Verbal  Danger to Others  Danger to Others None reported or observed

## 2024-02-17 ENCOUNTER — Other Ambulatory Visit: Payer: Self-pay

## 2024-02-17 ENCOUNTER — Emergency Department
Admission: EM | Admit: 2024-02-17 | Discharge: 2024-02-17 | Disposition: A | Discharge status: Home / Self Care | Attending: Emergency Medicine | Admitting: Emergency Medicine

## 2024-02-17 DIAGNOSIS — K0889 Other specified disorders of teeth and supporting structures: Secondary | ICD-10-CM | POA: Diagnosis present

## 2024-02-17 DIAGNOSIS — K047 Periapical abscess without sinus: Secondary | ICD-10-CM | POA: Diagnosis not present

## 2024-02-17 MED ORDER — KETOROLAC TROMETHAMINE 60 MG/2ML IM SOLN
30.0000 mg | Freq: Once | INTRAMUSCULAR | Status: AC
  Administered 2024-02-17: 30 mg via INTRAMUSCULAR
  Filled 2024-02-17: qty 2

## 2024-02-17 MED ORDER — AMOXICILLIN-POT CLAVULANATE 875-125 MG PO TABS
1.0000 | ORAL_TABLET | Freq: Two times a day (BID) | ORAL | 0 refills | Status: AC
Start: 2024-02-17 — End: 2024-02-24

## 2024-02-17 NOTE — Discharge Instructions (Signed)
 I have sent antibiotics to your pharmacy to treat a potential dental infection.  Please follow-up with dentistry for ongoing outpatient management.  I have given you resources for this.  You can also reach out to the [facility] if you are having difficulty finding follow-up.  Please return for any severe large swelling to the face, fevers, or difficulty swallowing.

## 2024-02-17 NOTE — ED Triage Notes (Signed)
 Pt c/o left left-sided toothache for several days. States she has a tooth that is broken down to the gumline.

## 2024-02-17 NOTE — ED Provider Notes (Signed)
 [facility] Provider Note    Event Date/Time   First MD Initiated Contact with Patient 02/17/24 1355     (approximate)   History   Dental Pain   HPI Alexa Guerra is a 33 y.o. female presenting today for dental pain.  Patient states having poor dentition in her left mandibular side.  Has known fractured teeth as well as dental caries.  Worsening pain in the last 24 hours with concern for infection.  No significant facial swelling, fevers, difficulty swallowing or breathing.     Physical Exam   Triage Vital Signs: ED Triage Vitals  Encounter Vitals Group     BP 02/17/24 1326 109/71     Girls Systolic BP Percentile --      Girls Diastolic BP Percentile --      Boys Systolic BP Percentile --      Boys Diastolic BP Percentile --      Pulse Rate 02/17/24 1326 80     Resp 02/17/24 1326 20     Temp 02/17/24 1326 98.2 F (36.8 C)     Temp Source 02/17/24 1326 Oral     SpO2 02/17/24 1326 96 %     Weight 02/17/24 1325 181 lb (82.1 kg)     Height 02/17/24 1325 5' 3 (1.6 m)     Head Circumference --      Peak Flow --      Pain Score 02/17/24 1324 10     Pain Loc --      Pain Education --      Exclude from Growth Chart --     Most recent vital signs: Vitals:   02/17/24 1326  BP: 109/71  Pulse: 80  Resp: 20  Temp: 98.2 F (36.8 C)  SpO2: 96%   I have reviewed the vital signs. General:  Awake, alert, no acute distress. Head:  Normocephalic, Atraumatic. EENT:  PERRL, EOMI, Oral mucosa pink and moist, Neck is supple.  Poor dentition in the left mandibular side with second premolar eroded.  Tenderness palpation but no palpable fluctuance Neuro:  Alert and oriented.  Interacting appropriately.   Skin:  Warm, dry, no rash.   Psych: Appropriate affect.    ED Results / Procedures / Treatments   Labs (all labs ordered are listed, but only abnormal results are displayed) Labs Reviewed - No data to  display   EKG    RADIOLOGY    PROCEDURES:  Critical Care performed: No  Procedures   MEDICATIONS ORDERED IN ED: Medications  ketorolac  (TORADOL ) injection 30 mg (has no administration in time range)     IMPRESSION / MDM / ASSESSMENT AND PLAN / ED COURSE  I reviewed the triage vital signs and the nursing notes.                              Differential diagnosis includes, but is not limited to, dental carry, dental infection  Patient's presentation is most consistent with acute, uncomplicated illness.  Patient is a 33 year old female presenting today for dental pain in her left mandibular side.  Exam shows exposed tooth with tenderness palpation.  No obvious fluctuance to indicate abscess.  Concern for dental carry versus dental infection but no other concerning features.  Will start on Augmentin  for antibiotic coverage.  Given follow-up with dentistry as well as strict return precaution.     FINAL CLINICAL IMPRESSION(S) / ED DIAGNOSES   Final diagnoses:  Dental infection     Rx / DC Orders   ED Discharge Orders          Ordered    amoxicillin -clavulanate (AUGMENTIN ) 875-125 MG tablet  2 times daily        02/17/24 1432             Note:  This document was prepared using Dragon voice recognition software and may include unintentional dictation errors.   Malvina Alm DASEN, MD 02/17/24 1434
